# Patient Record
Sex: Female | Born: 1945 | Race: White | Hispanic: No | Marital: Married | Smoking: Former smoker
Health system: Southern US, Community
[De-identification: ages and names within clinical notes are randomized; demographics above are authoritative.]

## PROBLEM LIST (undated history)

## (undated) DIAGNOSIS — G43909 Migraine, unspecified, not intractable, without status migrainosus: Secondary | ICD-10-CM

## (undated) DIAGNOSIS — T4145XA Adverse effect of unspecified anesthetic, initial encounter: Secondary | ICD-10-CM

## (undated) DIAGNOSIS — M797 Fibromyalgia: Secondary | ICD-10-CM

## (undated) DIAGNOSIS — G4730 Sleep apnea, unspecified: Secondary | ICD-10-CM

## (undated) DIAGNOSIS — K219 Gastro-esophageal reflux disease without esophagitis: Secondary | ICD-10-CM

## (undated) DIAGNOSIS — K2270 Barrett's esophagus without dysplasia: Secondary | ICD-10-CM

## (undated) DIAGNOSIS — E119 Type 2 diabetes mellitus without complications: Secondary | ICD-10-CM

## (undated) DIAGNOSIS — M1990 Unspecified osteoarthritis, unspecified site: Secondary | ICD-10-CM

## (undated) DIAGNOSIS — R0600 Dyspnea, unspecified: Secondary | ICD-10-CM

## (undated) DIAGNOSIS — I10 Essential (primary) hypertension: Secondary | ICD-10-CM

## (undated) DIAGNOSIS — F410 Panic disorder [episodic paroxysmal anxiety] without agoraphobia: Secondary | ICD-10-CM

## (undated) DIAGNOSIS — J351 Hypertrophy of tonsils: Secondary | ICD-10-CM

## (undated) DIAGNOSIS — I499 Cardiac arrhythmia, unspecified: Secondary | ICD-10-CM

## (undated) DIAGNOSIS — Z9289 Personal history of other medical treatment: Secondary | ICD-10-CM

## (undated) DIAGNOSIS — J988 Other specified respiratory disorders: Secondary | ICD-10-CM

## (undated) DIAGNOSIS — K5792 Diverticulitis of intestine, part unspecified, without perforation or abscess without bleeding: Secondary | ICD-10-CM

## (undated) DIAGNOSIS — J45909 Unspecified asthma, uncomplicated: Secondary | ICD-10-CM

## (undated) DIAGNOSIS — J189 Pneumonia, unspecified organism: Secondary | ICD-10-CM

## (undated) DIAGNOSIS — N289 Disorder of kidney and ureter, unspecified: Secondary | ICD-10-CM

## (undated) DIAGNOSIS — T8859XA Other complications of anesthesia, initial encounter: Secondary | ICD-10-CM

## (undated) HISTORY — DX: Unspecified asthma, uncomplicated: J45.909

## (undated) HISTORY — DX: Gastro-esophageal reflux disease without esophagitis: K21.9

## (undated) HISTORY — PX: CHOLECYSTECTOMY: SHX55

## (undated) HISTORY — PX: RETINAL DETACHMENT SURGERY: SHX105

## (undated) HISTORY — PX: TOTAL HIP ARTHROPLASTY: SHX124

## (undated) HISTORY — PX: JOINT REPLACEMENT: SHX530

## (undated) HISTORY — DX: Diverticulitis of intestine, part unspecified, without perforation or abscess without bleeding: K57.92

## (undated) HISTORY — DX: Migraine, unspecified, not intractable, without status migrainosus: G43.909

## (undated) HISTORY — PX: TOTAL KNEE ARTHROPLASTY: SHX125

## (undated) HISTORY — DX: Essential (primary) hypertension: I10

## (undated) HISTORY — PX: REVISION TOTAL HIP ARTHROPLASTY: SHX766

## (undated) HISTORY — PX: OTHER SURGICAL HISTORY: SHX169

---
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
## 2002-05-09 HISTORY — PX: ROTATOR CUFF REPAIR: SHX139

## 2004-05-09 DIAGNOSIS — M797 Fibromyalgia: Secondary | ICD-10-CM

## 2004-05-09 HISTORY — DX: Fibromyalgia: M79.7

## 2008-01-16 ENCOUNTER — Encounter: Admission: RE | Admit: 2008-01-16 | Discharge: 2008-01-16 | Payer: Self-pay | Admitting: Family Medicine

## 2008-05-23 ENCOUNTER — Encounter: Admission: RE | Admit: 2008-05-23 | Discharge: 2008-05-23 | Payer: Self-pay | Admitting: Orthopedic Surgery

## 2008-06-30 ENCOUNTER — Inpatient Hospital Stay (HOSPITAL_COMMUNITY): Admission: RE | Admit: 2008-06-30 | Discharge: 2008-07-03 | Payer: Self-pay | Admitting: Orthopedic Surgery

## 2008-08-26 ENCOUNTER — Inpatient Hospital Stay (HOSPITAL_COMMUNITY): Admission: RE | Admit: 2008-08-26 | Discharge: 2008-08-29 | Payer: Self-pay | Admitting: Orthopedic Surgery

## 2008-09-06 ENCOUNTER — Observation Stay (HOSPITAL_COMMUNITY): Admission: EM | Admit: 2008-09-06 | Discharge: 2008-09-07 | Payer: Self-pay | Admitting: Emergency Medicine

## 2008-10-08 ENCOUNTER — Encounter: Admission: RE | Admit: 2008-10-08 | Discharge: 2009-01-06 | Payer: Self-pay | Admitting: Orthopedic Surgery

## 2008-12-16 ENCOUNTER — Ambulatory Visit (HOSPITAL_COMMUNITY): Admission: RE | Admit: 2008-12-16 | Discharge: 2008-12-16 | Payer: Self-pay | Admitting: Surgery

## 2009-02-20 ENCOUNTER — Encounter: Payer: Self-pay | Admitting: Cardiovascular Disease

## 2009-06-02 ENCOUNTER — Encounter: Payer: Self-pay | Admitting: Cardiology

## 2009-06-19 DIAGNOSIS — R7301 Impaired fasting glucose: Secondary | ICD-10-CM | POA: Insufficient documentation

## 2009-06-19 DIAGNOSIS — M069 Rheumatoid arthritis, unspecified: Secondary | ICD-10-CM | POA: Insufficient documentation

## 2009-06-19 DIAGNOSIS — G43909 Migraine, unspecified, not intractable, without status migrainosus: Secondary | ICD-10-CM | POA: Insufficient documentation

## 2009-06-19 DIAGNOSIS — E785 Hyperlipidemia, unspecified: Secondary | ICD-10-CM | POA: Insufficient documentation

## 2009-06-19 DIAGNOSIS — M797 Fibromyalgia: Secondary | ICD-10-CM | POA: Insufficient documentation

## 2009-06-19 DIAGNOSIS — F3289 Other specified depressive episodes: Secondary | ICD-10-CM | POA: Insufficient documentation

## 2009-06-19 DIAGNOSIS — M1990 Unspecified osteoarthritis, unspecified site: Secondary | ICD-10-CM | POA: Insufficient documentation

## 2009-06-19 DIAGNOSIS — E089 Diabetes mellitus due to underlying condition without complications: Secondary | ICD-10-CM | POA: Insufficient documentation

## 2009-06-19 DIAGNOSIS — I10 Essential (primary) hypertension: Secondary | ICD-10-CM | POA: Insufficient documentation

## 2009-06-19 DIAGNOSIS — R609 Edema, unspecified: Secondary | ICD-10-CM | POA: Insufficient documentation

## 2009-06-19 DIAGNOSIS — F329 Major depressive disorder, single episode, unspecified: Secondary | ICD-10-CM

## 2009-06-19 DIAGNOSIS — J45909 Unspecified asthma, uncomplicated: Secondary | ICD-10-CM | POA: Insufficient documentation

## 2009-06-19 DIAGNOSIS — R0609 Other forms of dyspnea: Secondary | ICD-10-CM | POA: Insufficient documentation

## 2009-06-19 DIAGNOSIS — D72829 Elevated white blood cell count, unspecified: Secondary | ICD-10-CM | POA: Insufficient documentation

## 2009-06-19 DIAGNOSIS — F411 Generalized anxiety disorder: Secondary | ICD-10-CM | POA: Insufficient documentation

## 2009-06-19 DIAGNOSIS — F410 Panic disorder [episodic paroxysmal anxiety] without agoraphobia: Secondary | ICD-10-CM | POA: Insufficient documentation

## 2009-06-19 DIAGNOSIS — R040 Epistaxis: Secondary | ICD-10-CM | POA: Insufficient documentation

## 2009-06-19 DIAGNOSIS — R0989 Other specified symptoms and signs involving the circulatory and respiratory systems: Secondary | ICD-10-CM

## 2009-06-23 ENCOUNTER — Ambulatory Visit: Payer: Self-pay | Admitting: Cardiovascular Disease

## 2009-06-23 DIAGNOSIS — R072 Precordial pain: Secondary | ICD-10-CM | POA: Insufficient documentation

## 2009-06-23 DIAGNOSIS — R0602 Shortness of breath: Secondary | ICD-10-CM | POA: Insufficient documentation

## 2009-07-14 ENCOUNTER — Telehealth (INDEPENDENT_AMBULATORY_CARE_PROVIDER_SITE_OTHER): Payer: Self-pay | Admitting: *Deleted

## 2009-07-15 ENCOUNTER — Ambulatory Visit: Payer: Self-pay | Admitting: Cardiology

## 2009-07-15 ENCOUNTER — Ambulatory Visit: Payer: Self-pay

## 2009-07-15 ENCOUNTER — Encounter: Payer: Self-pay | Admitting: Cardiovascular Disease

## 2009-07-15 ENCOUNTER — Ambulatory Visit (HOSPITAL_COMMUNITY): Admission: RE | Admit: 2009-07-15 | Discharge: 2009-07-15 | Payer: Self-pay | Admitting: Cardiovascular Disease

## 2009-07-15 ENCOUNTER — Ambulatory Visit: Payer: Self-pay | Admitting: Internal Medicine

## 2009-07-15 ENCOUNTER — Encounter (HOSPITAL_COMMUNITY): Admission: RE | Admit: 2009-07-15 | Discharge: 2009-09-08 | Payer: Self-pay | Admitting: Cardiovascular Disease

## 2009-07-20 ENCOUNTER — Encounter (HOSPITAL_COMMUNITY): Admission: RE | Admit: 2009-07-20 | Discharge: 2009-09-08 | Payer: Self-pay | Admitting: Cardiovascular Disease

## 2009-07-20 ENCOUNTER — Ambulatory Visit: Payer: Self-pay | Admitting: Cardiovascular Disease

## 2009-07-20 ENCOUNTER — Ambulatory Visit: Payer: Self-pay

## 2009-07-20 ENCOUNTER — Encounter: Payer: Self-pay | Admitting: Cardiology

## 2009-09-10 ENCOUNTER — Encounter: Admission: RE | Admit: 2009-09-10 | Discharge: 2009-09-10 | Payer: Self-pay | Admitting: Family Medicine

## 2010-05-20 ENCOUNTER — Encounter: Payer: Self-pay | Admitting: Cardiology

## 2010-05-20 ENCOUNTER — Encounter: Payer: Self-pay | Admitting: Cardiovascular Disease

## 2010-05-20 ENCOUNTER — Inpatient Hospital Stay (HOSPITAL_COMMUNITY)
Admission: EM | Admit: 2010-05-20 | Discharge: 2010-05-22 | Payer: Self-pay | Source: Home / Self Care | Attending: Cardiology | Admitting: Cardiology

## 2010-05-24 LAB — HEPARIN LEVEL (UNFRACTIONATED)
Heparin Unfractionated: <0.1 IU/mL — ABNORMAL LOW (ref 0.30–0.70)
Heparin Unfractionated: <0.1 IU/mL — ABNORMAL LOW (ref 0.30–0.70)

## 2010-05-24 LAB — CBC
HCT: 35.6 % — ABNORMAL LOW (ref 36.0–46.0)
HCT: 34.2 % — ABNORMAL LOW (ref 36.0–46.0)
HCT: 37.5 % (ref 36.0–46.0)
Hemoglobin: 11.1 g/dL — ABNORMAL LOW (ref 12.0–15.0)
Hemoglobin: 10.6 g/dL — ABNORMAL LOW (ref 12.0–15.0)
Hemoglobin: 11.6 g/dL — ABNORMAL LOW (ref 12.0–15.0)
MCH: 20.6 pg — ABNORMAL LOW (ref 26.0–34.0)
MCH: 20.5 pg — ABNORMAL LOW (ref 26.0–34.0)
MCH: 20.4 pg — ABNORMAL LOW (ref 26.0–34.0)
MCHC: 31.2 g/dL (ref 30.0–36.0)
MCHC: 31 g/dL (ref 30.0–36.0)
MCHC: 30.9 g/dL (ref 30.0–36.0)
MCV: 66.2 fL — ABNORMAL LOW (ref 78.0–100.0)
MCV: 66.3 fL — ABNORMAL LOW (ref 78.0–100.0)
MCV: 66 fL — ABNORMAL LOW (ref 78.0–100.0)
Platelets: 189 10*3/uL (ref 150–400)
Platelets: 183 10*3/uL (ref 150–400)
Platelets: 225 10*3/uL (ref 150–400)
RBC: 5.38 MIL/uL — ABNORMAL HIGH (ref 3.87–5.11)
RBC: 5.16 MIL/uL — ABNORMAL HIGH (ref 3.87–5.11)
RBC: 5.68 MIL/uL — ABNORMAL HIGH (ref 3.87–5.11)
RDW: 15.9 % — ABNORMAL HIGH (ref 11.5–15.5)
RDW: 15.9 % — ABNORMAL HIGH (ref 11.5–15.5)
RDW: 16.2 % — ABNORMAL HIGH (ref 11.5–15.5)
WBC: 6.1 10*3/uL (ref 4.0–10.5)
WBC: 7.5 10*3/uL (ref 4.0–10.5)
WBC: 7.6 10*3/uL (ref 4.0–10.5)

## 2010-05-24 LAB — BASIC METABOLIC PANEL
BUN: 7 mg/dL (ref 6–23)
BUN: 5 mg/dL — ABNORMAL LOW (ref 6–23)
CO2: 26 mEq/L (ref 19–32)
CO2: 29 mEq/L (ref 19–32)
Calcium: 8.9 mg/dL (ref 8.4–10.5)
Calcium: 8.8 mg/dL (ref 8.4–10.5)
Chloride: 102 mEq/L (ref 96–112)
Chloride: 104 mEq/L (ref 96–112)
Creatinine, Ser: 0.52 mg/dL (ref 0.4–1.2)
Creatinine, Ser: 0.64 mg/dL (ref 0.4–1.2)
GFR calc Af Amer: >60 mL/min (ref >60)
GFR calc Af Amer: >60 mL/min (ref >60)
GFR calc non Af Amer: >60 mL/min (ref >60)
GFR calc non Af Amer: >60 mL/min (ref >60)
Glucose, Bld: 192 mg/dL — ABNORMAL HIGH (ref 70–99)
Glucose, Bld: 198 mg/dL — ABNORMAL HIGH (ref 70–99)
Potassium: 3.4 mEq/L — ABNORMAL LOW (ref 3.5–5.1)
Potassium: 3.8 mEq/L (ref 3.5–5.1)
Sodium: 137 mEq/L (ref 135–145)
Sodium: 139 mEq/L (ref 135–145)

## 2010-05-24 LAB — CARDIAC PANEL(CRET KIN+CKTOT+MB+TROPI)
CK, MB: 1.7 ng/mL (ref 0.3–4.0)
Relative Index: INVALID (ref 0.0–2.5)
Total CK: 80 U/L (ref 7–177)
Troponin I: 0.01 ng/mL (ref 0.00–0.06)

## 2010-05-24 LAB — GLUCOSE, CAPILLARY
Glucose-Capillary: 188 mg/dL — ABNORMAL HIGH (ref 70–99)
Glucose-Capillary: 174 mg/dL — ABNORMAL HIGH (ref 70–99)
Glucose-Capillary: 222 mg/dL — ABNORMAL HIGH (ref 70–99)
Glucose-Capillary: 147 mg/dL — ABNORMAL HIGH (ref 70–99)
Glucose-Capillary: 170 mg/dL — ABNORMAL HIGH (ref 70–99)
Glucose-Capillary: 153 mg/dL — ABNORMAL HIGH (ref 70–99)

## 2010-05-24 LAB — POCT CARDIAC MARKERS
CKMB, poc: 1.5 ng/mL (ref 1.0–8.0)
CKMB, poc: <1 ng/mL — ABNORMAL LOW (ref 1.0–8.0)
Myoglobin, poc: 54 ng/mL (ref 12–200)
Myoglobin, poc: 53.2 ng/mL (ref 12–200)
Troponin i, poc: 0.1 ng/mL — ABNORMAL HIGH (ref 0.00–0.09)
Troponin i, poc: <0.05 ng/mL (ref 0.00–0.09)

## 2010-05-24 LAB — CK TOTAL AND CKMB (NOT AT ARMC)
CK, MB: 1.4 ng/mL (ref 0.3–4.0)
Relative Index: INVALID (ref 0.0–2.5)
Total CK: 55 U/L (ref 7–177)

## 2010-05-24 LAB — TROPONIN I: Troponin I: 0.03 ng/mL (ref 0.00–0.06)

## 2010-05-24 LAB — PROTIME-INR
INR: 1.03 (ref 0.00–1.49)
Prothrombin Time: 13.7 seconds (ref 11.6–15.2)

## 2010-05-24 LAB — MRSA PCR SCREENING: MRSA by PCR: NEGATIVE

## 2010-05-24 LAB — LIPID PANEL
Cholesterol: 198 mg/dL (ref 0–200)
HDL: 35 mg/dL — ABNORMAL LOW (ref >39)
LDL Cholesterol: UNDETERMINED mg/dL (ref 0–99)
Total CHOL/HDL Ratio: 5.7 RATIO
Triglycerides: 767 mg/dL — ABNORMAL HIGH (ref <150)
VLDL: UNDETERMINED mg/dL (ref 0–40)

## 2010-05-24 LAB — D-DIMER, QUANTITATIVE: D-Dimer, Quant: 0.45 ug/mL-FEU (ref 0.00–0.48)

## 2010-05-31 NOTE — Discharge Summary (Addendum)
  NAME:  Anne Bryant, CLINKSCALES         ACCOUNT NO.:  [id number]  MEDICAL RECORD NO.:  [id number]          PATIENT TYPE:  INP  LOCATION:  6523                         FACILITY:  [facility]  PHYSICIAN:  Colleen Can. Deborah Chalk, M.D.DATE OF BIRTH:  Feb 01, 1946  DATE OF ADMISSION:  05/20/2010 DATE OF DISCHARGE:  05/22/2010                              DISCHARGE SUMMARY   Please note the patient stated she felt somewhat dizzy today and did not feel comfortable going home.  Her dizziness has been ongoing for the past several weeks and she has been evaluated as an outpatient for this by her primary care doctor.  She had a negative CT scan per the patient from her primary care provider and has pending followup when she is scheduled her appointment with [facility].  Telemetry was reviewed and she had no documented arrhythmia.  Echocardiogram showed normal EF with grade 1 diastolic dysfunction.  Dr. Deborah Chalk has seen and examined her today and feels she is stable for discharge.     Dayna Dunn, P.A.C.   ______________________________ Colleen Can. Deborah Chalk, M.D.    DD/MEDQ  D:  05/22/2010  T:  05/22/2010  Job:  [id number]  cc:   Verne Carrow, MD Dr. Divernon Sink  Electronically Signed by Ronie Spies  on 05/31/2010 10:32:08 AM Electronically Signed by Roger Shelter M.D. on 06/03/2010 03:34:54 PM

## 2010-05-31 NOTE — Discharge Summary (Addendum)
  NAME:  Anne Bryant, Anne Bryant         ACCOUNT NO.:  [id number]  MEDICAL RECORD NO.:  [id number]          PATIENT TYPE:  INP  LOCATION:  6523                         FACILITY:  [facility]  PHYSICIAN:  Colleen Can. Deborah Chalk, M.D.DATE OF BIRTH:  [birth year]  DATE OF ADMISSION:  05/20/2010 DATE OF DISCHARGE:  05/22/2010                              DISCHARGE SUMMARY   ADDENDUM  Addendum to the previously dictated discharge summary.  Please note upon discharge, the patient noted two errors on her medication reconciliation.  She is taking Wellbutrin 300 mg daily as well as no longer taking Imitrex.  Her med rec has been updated to coincide with these changes.     Dayna Dunn, P.A.C.   ______________________________ Colleen Can. Deborah Chalk, M.D.    DD/MEDQ  D:  05/22/2010  T:  05/22/2010  Job:  [id number]  Electronically Signed by Ronie Spies  on 05/31/2010 10:32:14 AM Electronically Signed by Roger Shelter M.D. on 06/03/2010 03:34:57 PM

## 2010-06-06 NOTE — H&P (Addendum)
NAME:  Anne Bryant, Anne Bryant NO.:  [id number]  MEDICAL RECORD NO.:  [id number]          PATIENT TYPE:  INP  LOCATION:  2925                         FACILITY:  [facility]  PHYSICIAN:  Madolyn Frieze. Jens Som, MD, FACCDATE OF BIRTH:  03/18/1946  DATE OF ADMISSION:  05/20/2010 DATE OF DISCHARGE:                             HISTORY & PHYSICAL   PRIMARY CARDIOLOGIST:  Verne Carrow, MD  PRIMARY CARE PROVIDER:  Dalbert Mayotte, MD  CHIEF COMPLAINT:  Chest tightness, nausea, and shortness of breath.  PATIENT PROFILE:  This is a 66 year old female without known coronary artery disease, but a history of hypertension, hyperlipidemia, irritable bowel syndrome, and fibromyalgia who presents here with complaints of chest pain of unclear etiology.  PAST MEDICAL HISTORY: 1. Hyperlipidemia. 2. Hypertension. 3. Fibromyalgia. 4. Depression. 5. Anxiety disorder. 6. Rheumatoid arthritis. 7. Migraine headaches. 8. Asthma. 9. Diabetes mellitus. 10.Obstructive sleep apnea, nightly CPAP use. 11.Restless legs syndrome. 12.Iron deficiency anemia. 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 14.Irritable bowel syndrome. 15.Status post left knee replacement in 2007. 16.Status post left hip replacement in 2010. 17.Umbilical hernia repair in 1989. 18.Status post cholecystectomy in 1989. 19.Status post left shoulder surgery.  HISTORY OF PRESENT ILLNESS:  This is a 65 year old female without known coronary artery disease, but the above-stated problem list who states that for the last 3 days she has had a tightness in the left side of her chest.  This has been continuous.  It has been associated with nausea, shortness of breath.  She denies any diaphoresis or vomiting.  The pain does increase with activity, lying flat, and with inspiration.  The patient also complains of dyspnea on exertion, but no orthopnea, PND, or syncope.  The patient also describes some left-sided facial numbness and tingling that also is  down her left arm.  This episode occurred on May 18, 2010, resolved on its own.  She has had no further episodes of this feeling.  The patient states she has had chest pain previously, but it was different than the pain that she presents with.  She did complete a Myoview in 2011 that showed no ischemia and ejection fraction 75%.  The patient presented to [facility] for a follow up with elevated blood pressure on day of admission and I spoke to her provider about her symptoms that she has been having.  She is also complaining of increased fatigue.  An EKG was obtained that did show new T-wave inversions, no change from her previous tracing 1 year ago.  The patient was sent to [facility] via ambulance.  She was placed on IV heparin, nitroglycerin, loaded with Plavix, and given metoprolol as well as aspirin.  Her pain has eased slightly, but she is still with chest discomfort.  Her initial point-of-care markers showed an elevated troponin of 0.10.  Cardiology has been asked to admit the patient for further evaluation.  SOCIAL HISTORY:  The patient lives in [place] with her husband. She has a son.  She is on disability.  She denies any tobacco abuse. She has an occasional alcoholic beverage.  She denies any illicit drug use.  FAMILY HISTORY:  Pertinent for early coronary artery disease on her father's side.  Her father had his first myocardial infarction at the age of 52 and passed away from myocardial infarction at the age of 30. Her mother has no known early coronary artery disease, did have breast cancer as well as late onset myocardial infarction.  ALLERGIES/INTOLERANCE: 1. LYRICA causing dizziness. 2. ZITHROMAX causing a funny feeling.  HOME MEDICATIONS: 1. Prednisone 5 mg 1-1/2 tablet daily. 2. Mirapex 0.5 mg 1 tablet nightly. 3. Singulair 10 mg 1 tablet daily. 4. Phenergan 25 mg 1 tablet every 6 hours as needed for nausea and     vomiting. 5. Wellbutrin 300  mg 1 tablet daily. 6. Cymbalta 120 mg delayed release 1 capsule daily. 7. Xanax 1 mg 1 tablet 3 times a day as needed for anxiety. 8. Abilify 2 mg 1 tablet daily. 9. Imitrex 25 mg 2 tablets at onset of migraine every 2 hours as     needed. 10.Dicyclomine hydrochloride 10 mg 1-2 capsules 4 times a day. 11.Neurontin 300 mg 1 capsule daily. 12.Albuterol inhaler 2 puffs 4 times a day. 13.QVAR 40 mcg 2 puffs 2 times a day. 14.Advair 250/50 one puff 2 times a day. 15.Ramipril 10 mg 1 capsule daily.  REVIEW OF SYSTEMS:  All pertinent positives and negatives as stated in HPI.  All other systems have been reviewed and are negative.  PHYSICAL EXAMINATION:  VITAL SIGNS:  Temperature 98.1, pulse 92, respirations 20, blood pressure 120/75, O2 saturations 95% on room air. GENERAL:  This is an obese white female.  She is in no acute distress. HEENT:  Normal. NECK:  Supple without bruit or JVD. HEART:  Regular rate and rhythm with S1 and S2.  No murmur, rub, or gallop noted.  PMI is normal.  Pulses are 2+ and equal bilaterally. LUNGS:  Clear to auscultation bilaterally without wheezes, rales, or rhonchi. ABDOMEN:  Soft, nontender, positive bowel sounds x4. EXTREMITIES:  No clubbing, cyanosis, or edema. MUSCULOSKELETAL:  No joint deformities or effusions. NEURO:  Alert and oriented x3, cranial nerves II through XII grossly intact.  Chest x-ray showing no acute cardiopulmonary disease.  EKG showing normal sinus rhythm at a rate 89 beats per minute.  There are anterolateral T-wave inversions that appear changed from previous tracing in 2011.  Axis is normal.  Interval are normal.  LABORATORY DATA:  WBC of 6.1, hemoglobin 11.1, hematocrit 35.6, platelet 189.  Sodium 137, potassium 3.4, chloride 102, bicarb 26, BUN 7, creatinine 0.52, troponin 0.10, myoglobin and CK-MB within normal limits x1, MCV 66.2, MCH 20.6.  ASSESSMENT AND PLAN:  This is a 65 year old female with history of diabetes  mellitus, hypertension, hypercholesterolemia, obstructive sleep apnea, fibromyalgia, irritable bowel syndrome who presents with chest pain of unclear etiology.  EKG is with new anterolateral T-wave inversions.  Point-of-care markers are mildly elevated.  Her EKG and troponins are concerning, but her pain is atypical, has been continuous over the last 3 days and worsens with inspiration in the supine position.  We will plan to admit the patient and cycle cardiac enzymes to rule out for myocardial infarction.  The patient will most likely need cardiac catheterization, these orders have been placed on the chart.  We will check a 2D echocardiogram in the emergency department,  check for any wall motion abnormalities as well as proximal aortic root  even though dissection is doubtful.  We will check a D-dimer and rule out  for pulmonary embolism. The patient will be continued on nitroglycerin drip,  heparin, aspirin, and a beta-blocker.  A fasting lipid  will be checked  in the morning.  We will also follow daily CBCs and BMET given the  patient's microcytic anemia but this appears to be chronic.  Further  treatment will be dependent upon the above results.     [name], PA-C   ______________________________ Madolyn Frieze. Jens Som, MD, [facility]    NB/MEDQ  D:  05/20/2010  T:  05/21/2010  Job:  [id number]  cc:   Verne Carrow, MD Dalbert Mayotte, M.D.  Electronically Signed by Alen Blew P.A. on 05/24/2010 06:46:38 AM Electronically Signed by Olga Millers MD FACC on 06/06/2010 09:02:32 PM

## 2010-06-08 NOTE — Assessment & Plan Note (Signed)
Summary: Cardiology Nuclear Study  Nuclear Med Background Indications for Stress Test: Evaluation for Ischemia, Surgical Clearance  Indications Comments: Pending (R) TKR by Dr. Durene Romans  History: Asthma, Myocardial Perfusion Study  History Comments:  ~10 yrs ago MPS:"unconclusive" per patient, recommended cath, patient refused.  Symptoms: Chest Pain, Chest Tightness, Chest Tightness with Exertion, Diaphoresis, Dizziness, DOE, Fatigue, Nausea, Rapid HR  Symptoms Comments: Last episode of CP:now, 4/10.   Nuclear Pre-Procedure Cardiac Risk Factors: Family History - CAD, Hypertension, Lipids, NIDDM, Obesity Caffeine/Decaff Intake: None NPO After: 7:00 PM Lungs: Clear.  O2 Sat 98% on RA.  Albuterol inhaler used prior to lexiscan. IV 0.9% NS with Angio Cath: 22g     IV Site: (L) AC IV Started by: Irean Hong RN Chest Size (in) 42     Cup Size B     Height (in): 62 Weight (lb): 181 BMI: 33.22  Nuclear Med Study 1 or 2 day study:  2 day     Stress Test Type:  Eugenie Birks Reading MD:  Willa Rough, MD     Referring MD:  Verne Carrow, MD Resting Radionuclide:  Technetium 61m Tetrofosmin     Resting Radionuclide Dose:  33.0 mCi  Stress Radionuclide:  Technetium 24m Tetrofosmin     Stress Radionuclide Dose:  33.0 mCi   Stress Protocol   Lexiscan: 0.4 mg   Stress Test Technologist:  Rea College CMA-N     Nuclear Technologist:  Burna Mortimer Deal RT-N  Rest Procedure  Myocardial perfusion imaging was performed at rest 45 minutes following the intravenous administration of Myoview Technetium 61m Tetrofosmin.  Stress Procedure  The patient received IV Lexiscan 0.4 mg over 15-seconds.  Myoview injected at 30-seconds.  There were no significant changes with lexiscan.  She did c/o chest pressure with lexiscan.  Quantitative spect images were obtained after a 45 minute delay.  QPS Raw Data Images:  Patient motion noted; appropriate software correction applied. Stress Images:  There is  normal uptake in all areas. Rest Images:  Normal homogeneous uptake in all areas of the myocardium. Subtraction (SDS):  No evidence of ischemia. Transient Ischemic Dilatation:  .84  (Normal <1.22)  Lung/Heart Ratio:  .28  (Normal <0.45)  Quantitative Gated Spect Images QGS EDV:  67 ml QGS ESV:  17 ml QGS EF:  74 % QGS cine images:  Normal motion   Findings Normal nuclear study      Overall Impression  Exercise Capacity: Lexiscan BP Response: Normal blood pressure response. Clinical Symptoms: chest pressure ECG Impression: No significant ST segment change suggestive of ischemia. Overall Impression: Normal stress nuclear study.  Appended Document: Cardiology Nuclear Study Reviewed with pt at visit. cdm

## 2010-06-08 NOTE — Progress Notes (Signed)
Summary: Nuclear Pre-Procedure  Phone Note Outgoing Call   Call placed by: Burna Mortimer Deal Call placed to: Patient Action Taken: Phone Call Completed Summary of Call: Reviewed information on Myoview Information Sheet (see scanned document for further details).  Spoke with Patient.    Nuclear Med Background Indications for Stress Test: Evaluation for Ischemia, Surgical Clearance  Indications Comments: Pending (R) Knee Replacement  History: Asthma   Symptoms: Chest Pain, Chest Tightness, Chest Tightness with Exertion, Dizziness, DOE, Fatigue, SOB    Nuclear Pre-Procedure Cardiac Risk Factors: Family History - CAD, Hypertension, Lipids, Obesity Height (in): 62

## 2010-06-08 NOTE — Assessment & Plan Note (Signed)
Summary: 3 WK/F/U [facility]   Visit Type:  Follow-up Primary Provider:  Jill Side Snider,MD  CC:  SOB.  History of Present Illness: 65 yo WF with multiple medical problems including borderline DM, HTN, hyperlipidemia, obesity, sedentary lifestyle here today for cardiac follow up. I saw her as a new patient several weeks ago for cardiac assessment prior to planned right knee replacement. She told  me that she was recently diagnosed with a murmur. She describes substernal chest tightness that occurs with rest and with exertion. She has been dyspneic and cannot perform simple household tasks secondary to the dyspnea. She has occasional dizziness but denies syncope or palpitations, lower extremity edema. She recently had a sleep study and was found to have apneic periods with desaturation. She has been wearing CPAP for the past few months. Overall, her energy level has been poor. She describes constant malaise and fatigue. She has had daily lower extremity edema over the last few months, resolves at night, bilateral. No pain in legs or unilateral lower extremity edema.   Stress testing and echo was ordered. (outlined below). She tells me today that she continues to have daily fatigue, occasional chest pressure and dyspnea.   Current Medications (verified): 1)  Norvasc 5 Mg Tabs (Amlodipine Besylate) .Marland Kitchen.. 1 Tab Once Daily 2)  Ramipril 10 Mg Caps (Ramipril) .Marland Kitchen.. 1 Cap Once Daily 3)  Cymbalta 60 Mg Cpep (Duloxetine Hcl) .... 2 Cap Once Daily 4)  Xanax 1 Mg Tabs (Alprazolam) .Marland Kitchen.. 1 Tab Three Times A Day 5)  Wellbutrin Xl 300 Mg Xr24h-Tab (Bupropion Hcl) .Marland Kitchen.. 1 Tab Once Daily 6)  Vicodin 5-500 Mg Tabs (Hydrocodone-Acetaminophen) .... As Needed 7)  Promethazine Hcl 25 Mg Tabs (Promethazine Hcl) .... As Needed 8)  Vitamin D (Ergocalciferol) 50000 Unit Caps (Ergocalciferol) .Marland Kitchen.. 1 Cap Twice Weekly 9)  Nasonex 50 Mcg/act Susp (Mometasone Furoate) .... Use As Directed 10)  Bumetanide 1 Mg Tabs  (Bumetanide) .Marland Kitchen.. 1 Tab Once Daily 11)  Neurontin 300 Mg Caps (Gabapentin) .Marland Kitchen.. 1 Tab Once Daily 12)  Singulair 10 Mg Tabs (Montelukast Sodium) .Marland Kitchen.. 1 Tab At Bedtime 13)  Prednisone 5 Mg Tabs (Prednisone) .Marland Kitchen.. 1 1/2 Tab Once Daily 14)  Omeprazole 20 Mg Tbec (Omeprazole) .Marland Kitchen.. 1 Tab Two Times A Day 15)  Cpap Machine .... Use At Bedtime 16)  Allergy Relief 4 Mg Tabs (Chlorpheniramine Maleate) .... As Needed 17)  Albuterol Sulfate (2.5 Mg/60ml) 0.083% Nebu (Albuterol Sulfate) .... As Needed 18)  Macrobid 100 Mg Caps (Nitrofurantoin Monohyd Macro) .Marland Kitchen.. 1 Cap Once Daily As Needed 19)  Advair Diskus 250-50 Mcg/dose Aepb (Fluticasone-Salmeterol) .Marland Kitchen.. 1 Puff Two Times A Day 20)  Mirapex 0.25 Mg Tabs (Pramipexole Dihydrochloride) .Marland Kitchen.. 1 Tab At Bedtime 21)  Excedrin Migraine 250-250-65 Mg Tabs (Aspirin-Acetaminophen-Caffeine) .... As Needed 22)  Tylenol 325 Mg Tabs (Acetaminophen) .... As Needed  Allergies: 1)  ! Zoloft 2)  ! * Lyrica 3)  ! * Lexapro 4)  ! Zithromax  Past History:  Past Medical History: Reviewed history from 06/23/2009 and no changes required. HYPERTENSION (ICD-401.9) HYPERLIPIDEMIA (ICD-272.4) LEG EDEMA (ICD-782.3) LEUKOCYTOSIS (ICD-288.60) DM (ICD-250.00) IMPAIRED FASTING GLUCOSE (ICD-790.21) EPISTAXIS (ICD-784.7) MIGRAINE HEADACHE (ICD-346.90) ANXIETY DISORDER (ICD-300.00) DEPRESSION (ICD-311) FIBROMYALGIA (ICD-729.1) PANIC ATTACK (ICD-300.01) ASTHMA (ICD-493.90) RHEUMATOID ARTHRITIS (ICD-714.0) OSTEOARTHRITIS (ICD-715.90) Recurrent UTI GERD  Social History: Reviewed history from 06/23/2009 and no changes required. Disabled  Married , 2 children Tobacco Use - No.  Alcohol Use - no Regular Exercise - no Drug Use - no  Review of Systems  The patient complains of fatigue, malaise, chest pain, shortness of breath, heartburn, muscle weakness, leg swelling, and anxiety.  The patient denies fever, weight gain/loss, vision loss, decreased hearing, hoarseness,  palpitations, prolonged cough, wheezing, sleep apnea, coughing up blood, abdominal pain, blood in stool, nausea, vomiting, diarrhea, incontinence, blood in urine, joint pain, rash, skin lesions, headache, fainting, dizziness, depression, enlarged lymph nodes, easy bruising or bleeding, and environmental allergies.    Vital Signs:  Patient profile:   64 year old female Height:      62 inches Weight:      181 pounds BMI:     33.22 Pulse rate:   98 / minute Pulse rhythm:   regular BP sitting:   114 / 70  (left arm) Cuff size:   large  Vitals Entered By: Danielle Rankin, CMA (July 20, 2009 2:07 PM)  Physical Exam  General:  General: Well developed, well nourished, NAD HEENT: OP clear, mucus membranes moist Psychiatric: Mood and affect normal Neck: No JVD, no carotid bruits, no thyromegaly, no lymphadenopathy. Lungs:Clear bilaterally, no wheezes, rhonci, crackles CV: RRR no murmurs, gallops rubs Abdomen: soft, NT, ND, BS present Extremities: No edema, pulses 2+.    Echocardiogram  Procedure date:  07/15/2009  Findings:      - Left ventricle: The cavity size was normal. There was mild       concentric hypertrophy. The estimated ejection fraction was 60%.       Wall motion was normal; there were no regional wall motion       abnormalities. Features are consistent with a pseudonormal left       ventricular filling pattern, with concomitant abnormal relaxation       and increased filling pressure (grade 2 diastolic dysfunction).     - Left atrium: The atrium was mildly dilated.     - Atrial septum: There was increased thickness of the septum,       consistent with lipomatous hypertrophy.  Nuclear Study  Procedure date:  07/20/2009  Findings:      No ischemia. EF 74%.   Impression & Recommendations:  Problem # 1:  CHEST PAIN-PRECORDIAL (ICD-786.51) No evidence of ischemia on stress test. Normal LVEF. She does have diastolic dysfunction with concentric left ventricular  hypertrophy, most likely from her longstanding HTN. No further ischemic workup prior to planned surgical procedures. Will repeat echo one year.   Her updated medication list for this problem includes:    Norvasc 5 Mg Tabs (Amlodipine besylate) .Marland Kitchen... 1 tab once daily    Ramipril 10 Mg Caps (Ramipril) .Marland Kitchen... 1 cap once daily  Problem # 2:  HYPERTENSION (ICD-401.9) Well controlled today on current therapy.  No changes.   Her updated medication list for this problem includes:    Norvasc 5 Mg Tabs (Amlodipine besylate) .Marland Kitchen... 1 tab once daily    Ramipril 10 Mg Caps (Ramipril) .Marland Kitchen... 1 cap once daily    Bumetanide 1 Mg Tabs (Bumetanide) .Marland Kitchen... 1 tab once daily  Patient Instructions: 1)  Your physician recommends that you schedule a follow-up appointment in: 12 months 2)  Your physician has requested that you have an echocardiogram.  Echocardiography is a painless test that uses sound waves to create images of your heart. It provides your doctor with information about the size and shape of your heart and how well your heart's chambers and valves are working.  This procedure takes approximately one hour. There are no restrictions for this procedure. To be done in 12 months

## 2010-06-08 NOTE — Consult Note (Signed)
Summary: [facility]  [facility]   Imported By: Marylou Mccoy 08/21/2009 14:46:03  _____________________________________________________________________  External Attachment:    Type:   Image     Comment:   External Document

## 2010-06-08 NOTE — Assessment & Plan Note (Signed)
Summary: np6/surgical clearance/shoulder surgery   Visit Type:  Initial Consult Primary Provider:  [facility]  CC:  Preoperative risk assessment. Pt complains of chest pains and SOB.  History of Present Illness: 65 yo WF with multiple medical problems including borderline DM, HTN, hyperlipidemia, obesity, sedentary lifestyle referred today for cardiac assessment prior to planned right knee replacement. She tells me that she has recently been diagnosed with a murmur. She describes substernal chest tightness that occurs with rest and with exertion. She has been dyspneic and cannot perform simple household tasks secondary to the dyspnea. She has occasional dizziness but denies syncope or palpitations, lower extremity edema. She recently had a sleep study and was found to have apneic periods with desaturation. She has been wearing CPAP for the past few months. Overall, her energy level has been poor. She describes constant malaise and fatigue. She has had daily lower extremity edema over the last few months, resolves at night, bilateral. No pain in legs or unilateral lower extremity edema.   Current Medications (verified): 1)  Norvasc 5 Mg Tabs (Amlodipine Besylate) .Marland Kitchen.. 1 Tab Once Daily 2)  Ramipril 10 Mg Caps (Ramipril) .Marland Kitchen.. 1 Cap Once Daily 3)  Tricor 145 Mg Tabs (Fenofibrate) .Marland Kitchen.. 1 Tab Every Other Day 4)  Cymbalta 60 Mg Cpep (Duloxetine Hcl) .... 2 Cap Once Daily 5)  Xanax 1 Mg Tabs (Alprazolam) .Marland Kitchen.. 1 Tab Three Times A Day 6)  Wellbutrin Xl 150 Mg Xr24h-Tab (Bupropion Hcl) .Marland Kitchen.. 1 Tab Once Daily 7)  Vicodin 5-500 Mg Tabs (Hydrocodone-Acetaminophen) .... As Needed 8)  Promethazine Hcl 25 Mg Tabs (Promethazine Hcl) .... As Needed 9)  Vitamin D (Ergocalciferol) 50000 Unit Caps (Ergocalciferol) .Marland Kitchen.. 1 Cap Twice Weekly 10)  Nasonex 50 Mcg/act Susp (Mometasone Furoate) .... Use As Directed 11)  Furosemide 20 Mg Tabs (Furosemide) .Marland Kitchen.. 1 Tab Once Daily As Needed 12)  Neurontin 300 Mg Caps  (Gabapentin) .Marland Kitchen.. 1 Tab Once Daily 13)  Singulair 10 Mg Tabs (Montelukast Sodium) .Marland Kitchen.. 1 Tab At Bedtime 14)  Prednisone 5 Mg Tabs (Prednisone) .Marland Kitchen.. 1 1/2 Tab Once Daily 15)  Omeprazole 20 Mg Tbec (Omeprazole) .Marland Kitchen.. 1 Tab Two Times A Day 16)  Cpap Machine .... Use At Bedtime 17)  Macrobid 100 Mg Caps (Nitrofurantoin Monohyd Macro) .Marland Kitchen.. 1 Tab By Mouth Once Daily 18)  Allergy Relief 4 Mg Tabs (Chlorpheniramine Maleate) .... As Needed 19)  Albuterol Sulfate (2.5 Mg/21ml) 0.083% Nebu (Albuterol Sulfate) .... As Needed  Allergies: 1)  ! Zoloft 2)  ! * Lyrica 3)  ! * Lexapro 4)  ! Zithromax  Past History:  Past Medical History: HYPERTENSION (ICD-401.9) HYPERLIPIDEMIA (ICD-272.4) LEG EDEMA (ICD-782.3) LEUKOCYTOSIS (ICD-288.60) DM (ICD-250.00) IMPAIRED FASTING GLUCOSE (ICD-790.21) EPISTAXIS (ICD-784.7) MIGRAINE HEADACHE (ICD-346.90) ANXIETY DISORDER (ICD-300.00) DEPRESSION (ICD-311) FIBROMYALGIA (ICD-729.1) PANIC ATTACK (ICD-300.01) ASTHMA (ICD-493.90) RHEUMATOID ARTHRITIS (ICD-714.0) OSTEOARTHRITIS (ICD-715.90) Recurrent UTI GERD  Past Surgical History: Left hip replacement 2010 with repair left femur after fall and fracture 2010 Left knee replacement 2007 Rotator cuff surgery Cholecystectomy Umbilical hernia repair  Family History: Father: deceased age 24. First MI at age 28. Mother: deceased age 68 breast cancer, bladder cancer, cirrhosis (? NASH) Uncle died MI at age 87 Grandmother died MI at age 78  Social History: Disabled  Married , 2 children Tobacco Use - No.  Alcohol Use - no Regular Exercise - no Drug Use - no  Review of Systems       The patient complains of fatigue, malaise, chest pain, shortness of breath, sleep apnea,  muscle weakness, joint pain, leg swelling, dizziness, and anxiety.  The patient denies fever, weight gain/loss, vision loss, decreased hearing, hoarseness, palpitations, prolonged cough, wheezing, coughing up blood, abdominal pain, blood in  stool, nausea, vomiting, diarrhea, heartburn, incontinence, blood in urine, rash, skin lesions, headache, fainting, enlarged lymph nodes, easy bruising or bleeding, and environmental allergies.    Vital Signs:  Patient profile:   65 year old female Height:      62 inches Weight:      180 pounds BMI:     33.04 Pulse rate:   101 / minute Resp:     14 per minute BP sitting:   92 / 63  (left arm)  Vitals Entered By: Kem Parkinson (June 23, 2009 1:56 PM)  Physical Exam  General:  General: Well developed, well nourished, NAD. Pt mildly diaphoretic.  HEENT: OP clear, mucus membranes moist SKIN: warm, moist. Neuro: No focal deficits Musculoskeletal: Muscle strength 5/5 all ext Psychiatric: Mood and affect normal Neck: No JVD, no carotid bruits, no thyromegaly, no lymphadenopathy. Lungs:Clear bilaterally, no wheezes, rhonci, crackles CV: RRR no murmurs, gallops rubs Abdomen: soft, NT, ND, BS present Extremities: No edema, pulses 2+.    EKG  Procedure date:  06/23/2009  Findings:      NSR rate 85 bpm. QTC 471 msec.  Impression & Recommendations:  Problem # 1:  CHEST PAIN-PRECORDIAL (ICD-786.51) Risk factors for coronary artery disease include HTN, hyperlipidemia, borderline DM, obesity, sedentary lifestyle and a strong family history of CAD. Her chest pain has mostly atypical features but given her risk factors for CAD, will proceed with Lexiscan stress Myoview. She cannot exercise on the treadmill secondary  to her joint issues.  Her updated medication list for this problem includes:    Norvasc 5 Mg Tabs (Amlodipine besylate) .Marland Kitchen... 1 tab once daily    Ramipril 10 Mg Caps (Ramipril) .Marland Kitchen... 1 cap once daily  Problem # 2:  DYSPNEA (ICD-786.05) Will get echocardiogram to assess LV size and function as well as to exclude any significant valvular disease as an etiology of her dyspnea.  I will see her back in 2-3 weeks to review the tests and will alert Dr. Charlann Boxer and Dr. Drue Second  of recommendations prior to the right knee replacement.   Her updated medication list for this problem includes:    Norvasc 5 Mg Tabs (Amlodipine besylate) .Marland Kitchen... 1 tab once daily    Ramipril 10 Mg Caps (Ramipril) .Marland Kitchen... 1 cap once daily    Furosemide 20 Mg Tabs (Furosemide) .Marland Kitchen... 1 tab once daily as needed  Problem # 3:  LEG EDEMA (ICD-782.3) Most likely dependent edema. Exam not c/w DVT.  Elevate legs during the day.   Other Orders: Nuclear Stress Test (Nuc Stress Test) Echocardiogram (Echo)  Patient Instructions: 1)  Your physician recommends that you schedule a follow-up appointment in: 2-3 weeks 2)  Your physician has requested that you have an echocardiogram.  Echocardiography is a painless test that uses sound waves to create images of your heart. It provides your doctor with information about the size and shape of your heart and how well your heart's chambers and valves are working.  This procedure takes approximately one hour. There are no restrictions for this procedure. 3)  Your physician has requested that you have an adenosine myoview.  For further information please visit [email or web address]  Please follow instruction sheet, as given.

## 2010-06-08 NOTE — Consult Note (Signed)
Summary: [facility] Medicine Referral Form  [facility] Family Medicine Referral Form   Imported By: Roderic Ovens 07/30/2009 14:51:09  _____________________________________________________________________  External Attachment:    Type:   Image     Comment:   External Document

## 2010-06-17 NOTE — Discharge Summary (Signed)
NAME:  Anne, Bryant NO.:  [id number]  MEDICAL RECORD NO.:  [id number]          PATIENT TYPE:  INP  LOCATION:  6523                         FACILITY:  [facility]  PHYSICIAN:  Marca Ancona, MD      DATE OF BIRTH:  Aug 11, 1945  DATE OF ADMISSION:  05/20/2010 DATE OF DISCHARGE:  05/21/2010                              DISCHARGE SUMMARY   PRIMARY CARDIOLOGIST:  Verne Carrow, MD  PRIMARY CARE PROVIDER:  Dalbert Mayotte, MD.  DISCHARGE DIAGNOSES: 1. Chest pain, noncardiac.     a.     Status post cardiac catheterization showing no coronary      artery disease. 2. Hypertension. 3. Hyperlipidemia.  SECONDARY DIAGNOSES: 1. Fibromyalgia. 2. Depression/anxiety. 3. Obstructive sleep apnea, nightly CPAP use. 4. Irritable bowel syndrome. 5. Iron deficiency anemia. 6. Restless leg syndrome. 7. Asthma.  PROCEDURE/DIAGNOSTICS PERFORMED DURING HOSPITALIZATION: 1. Left heart catheterization with selective coronary angiography.     a.     No coronary artery disease. 2. Echocardiogram on May 20, 2010, showing mild left ventricular     hypertrophy.  Left ventricular ejection fraction was normal in the     range of 55-60%. 3. Chest x-ray showing no acute cardiopulmonary disease.  ALLERGIES: 1. AZITHROMYCIN. 2. LYRICA.  HISTORY OF PRESENT ILLNESS:  This is a 65 year old female with the above- stated problem list who presented to [facility] with chest pain of unclear etiology.  Her EKG did show new anterolateral T-wave inversions. Her initial point-of-care markers had an elevated troponin of 0.1.  A stat echocardiogram was completed in the emergency department that showed no wall motion abnormalities or proximal aortic root dissection. With the patient's chest pain and diagnostic study, she was admitted for further evaluation.  HOSPITAL COURSE:  The patient continued to have chest pain.  Further to know if this was atypical in nature with her EKG changes,  diagnostic catheterization was indicated.  Of note, the patient's initial point-of- care markers were elevated but remainder cardiac enzymes were negative. Risks, benefits, and indications catheterization were discussed with the patient and her husband, they agreed to proceed.  Dr. Shirlee Latch brought the patient to the cath lab where informed consent was obtained.  As above, catheterization showed no evidence of coronary artery disease.  The patient tolerated the procedure well.  Risk factor modifications were encouraged.  The patient was taken to the radial round for observation and ambulation.  Of note, the patient was hypertensive upon admission.  She was placed on IV nitroglycerin during admission.  She will be discharged on Norvasc.  No beta-blocker was added secondary to the patient's asthma.  She will be continued on her ramipril.  Dr. Shirlee Latch felt the patient is stable for home after bedrest and ambulation.  The patient is hemodynamically stable.  Her radial site is without signs of hematoma.  DISCHARGE LABS:  WBC 7.5, hemoglobin 10.6, hematocrit 34.2, platelets 193,000.  Sodium 139, potassium 3.8, chloride 104, bicarb 29, BUN 5, creatinine 0.64, cholesterol 198, triglycerides 767, HDL 35.  DISCHARGE MEDICATIONS: 1. Amlodipine 10 mg 1 tablet daily. 2. Alprazolam 0.5 mg 4 times a day. 3. Omeprazole 20 mg twice daily. 4. Ramipril  10 mg daily. 5. Cymbalta 60 mg twice daily. 6. Imitrex 0.5 for 1 tablet every 2 hours as needed at onset of     migraines. 7. Prednisone 5 mg 1/2 tablet daily. 8. Gabapentin 300 mg nightly. 9. TriCor 145 mg daily. 10.Bupropion 75 mg 1 tablet daily. 11.Celebrex 200 mg 1 tablet daily. 12.Dicyclomine 10 mg 1-2 tablets 3 times a day for irritable bowel. 13.Vitamin D2 5000 units 1 capsule every 2 weeks. 14.Advair 250/50, 1 puff inhaled twice daily. 15.Albuterol inhaler 1-2 puffs inhaled daily for shortness of breath. 16.QVAR inhalation 2 puffs inhaled  twice daily. 17.Hydrocodone/APAP 10/500 mg 1 tablet 4 times a day for pain.  FOLLOWUP PLANS AND INSTRUCTIONS: 1. The patient will follow up with Dr. Clifton James as previously     scheduled. 2. The patient will follow up with Dr. Drue Second within 2-3 weeks. 3. The patient is to continue low-sodium heart-healthy and diabetic     diet. 4. The patient to increase activity slowly.  She may shower but no     bathing.  She is not to lift anything greater     than 5 pounds for 1 week.  She is to call our office for any     problems. 5. Avoid any activity that causes chest pain, dizziness, or shortness     of breath.  DURATION OF DISCHARGE:  Greater than 30 minutes with the physician and physician extender time.     [name], PA-C   ______________________________ Marca Ancona, MD    NB/MEDQ  D:  05/21/2010  T:  05/22/2010  Job:  [id number]  cc:   Dalbert Mayotte, M.D. Verne Carrow, MD  Electronically Signed by Alen Blew P.A. on 05/24/2010 06:46:54 AM Electronically Signed by Marca Ancona MD on 06/17/2010 56:21:30 AM

## 2010-06-17 NOTE — Procedures (Signed)
  NAME:  Anne Bryant, Anne Bryant NO.:  [id number]  MEDICAL RECORD NO.:  [id number]          PATIENT TYPE:  INP  LOCATION:  6523                         FACILITY:  [facility]  PHYSICIAN:  Marca Ancona, MD      DATE OF BIRTH:  1945-06-13  DATE OF PROCEDURE:  05/21/2010 DATE OF DISCHARGE:                           CARDIAC CATHETERIZATION   PROCEDURES: 1. Left heart catheterization. 2. Coronary angiography. 3. Left ventriculography.  INDICATION:  This is a 65 year old with coronary risk factors who presented with multiple episodes of chest pain.  She has had a negative Myoview in the past.  It was ultimately decided that she would undergo left heart catheterization.  PROCEDURE NOTE:  After informed consent was obtained, the patient underwent Allen testing of the right wrist.  The Allen test was positive signifying good collateral circulation from the ulnar artery to the radial side of the hand.  The right wrist was sterilely prepped and draped.  Lidocaine 1% was used to locally anesthetize the right wrist area.  The right radial artery was entered using modified Seldinger technique and a 5-French arterial sheath was placed.  The patient received 3 mg of intra-arterial verapamil as well as 4000 units of IV heparin.  The right coronary artery was engaged using JR-4 catheter. The left coronary artery was engaged using JR-3.5 catheter.  Left ventricle was entered using angled pigtail catheter.  There were no complications.  FINDINGS: 1. Hemodynamics, LV 146/18, aorta 147/90. 2. Left ventriculography.  EF was estimated at 60%.  There were no     regional wall motion abnormalities. 3. Right coronary artery.  The right coronary artery was a dominant     vessel.  There was no angiographic coronary artery disease. 4. Left main.  The left main had no angiographic coronary artery     disease. 5. Left circumflex.  The left circumflex had no angiographic coronary     artery  disease. 6. LAD system.  The LAD has diagonals, had no angiographic coronary     artery disease.  IMPRESSION:  This is a 65 year old who presented with noncardiac chest pain. There is no angiographic coronary artery disease, left ventricular systolic function is normal.  She does need blood pressure control and she will go home on ramipril and Norvasc.     Marca Ancona, MD     DM/MEDQ  D:  05/21/2010  T:  05/22/2010  Job:  [id number]  cc:   Dalbert Mayotte, MD  Electronically Signed by Marca Ancona MD on 06/17/2010 08:26:50 AM

## 2010-06-24 NOTE — Consult Note (Signed)
[facility]: [facility]   Imported By: Marylou Mccoy 06/17/2010 09:19:02  _____________________________________________________________________  External Attachment:    Type:   Image     Comment:   External Document

## 2010-07-01 ENCOUNTER — Encounter: Payer: Self-pay | Admitting: Cardiovascular Disease

## 2010-07-01 ENCOUNTER — Encounter (INDEPENDENT_AMBULATORY_CARE_PROVIDER_SITE_OTHER): Payer: Medicare Other | Admitting: Cardiovascular Disease

## 2010-07-01 DIAGNOSIS — R072 Precordial pain: Secondary | ICD-10-CM

## 2010-07-01 DIAGNOSIS — R609 Edema, unspecified: Secondary | ICD-10-CM

## 2010-07-06 NOTE — Assessment & Plan Note (Signed)
Summary: EPH PER NIGHT MESSAGE/LG/AC   Visit Type:  eph Primary Provider:  [facility]  CC:  sob and edema in legs and ankles. pt needs clarification of hospital results..  History of Present Illness: 65 yo WF with multiple medical problems including borderline sleep apnea, DM, HTN, hyperlipidemia, obesity, sedentary lifestyle here today for cardiac follow up. I saw her as a new patient in February 2011 for cardiac assessment prior to planned right knee replacement. She recently had a sleep study and was found to have apneic periods with desaturation. She has been wearing CPAP.  At the first visit last year, she described fatigue and chest pain. Stress testing and echo were ordered. LV systolic function was normal. Stress test showed no evidence of ischemia. She was admitted to [facility] on 05/20/10 with chest pain. Cardiac enzymes were negative. Cardiac cath on 05/21/10 with no evidence of CAD. There was no evidence of CAD. Her LV function was normal.   Current Medications (verified): 1)  Norvasc 5 Mg Tabs (Amlodipine Besylate) .Marland Kitchen.. 1 Tab Once Daily 2)  Ramipril 10 Mg Caps (Ramipril) .Marland Kitchen.. 1 Cap Once Daily 3)  Cymbalta 60 Mg Cpep (Duloxetine Hcl) .... 2 Cap Once Daily 4)  Xanax 1 Mg Tabs (Alprazolam) .Marland Kitchen.. 1 Tab Three Times A Day 5)  Wellbutrin Xl 300 Mg Xr24h-Tab (Bupropion Hcl) .Marland Kitchen.. 1 Tab Once Daily 6)  Vicodin 5-500 Mg Tabs (Hydrocodone-Acetaminophen) .... As Needed 7)  Promethazine Hcl 25 Mg Tabs (Promethazine Hcl) .... As Needed 8)  Vitamin D (Ergocalciferol) 50000 Unit Caps (Ergocalciferol) .Marland Kitchen.. 1 Cap Twice Weekly 9)  Nasonex 50 Mcg/act Susp (Mometasone Furoate) .... Use As Directed 10)  Neurontin 300 Mg Caps (Gabapentin) .Marland Kitchen.. 1 Tab Once Daily 11)  Singulair 10 Mg Tabs (Montelukast Sodium) .Marland Kitchen.. 1 Tab At Bedtime 12)  Prednisone 5 Mg Tabs (Prednisone) .Marland Kitchen.. 1 1/2 Tab Once Daily 13)  Omeprazole 20 Mg Tbec (Omeprazole) .Marland Kitchen.. 1 Tab Two Times A Day 14)  Cpap Machine With O2 ....  Use At Bedtime 15)  Allergy Relief 4 Mg Tabs (Chlorpheniramine Maleate) .... As Needed 16)  Albuterol Sulfate (2.5 Mg/15ml) 0.083% Nebu (Albuterol Sulfate) .... As Needed 17)  Macrobid 100 Mg Caps (Nitrofurantoin Monohyd Macro) .Marland Kitchen.. 1 Cap Once Daily As Needed 18)  Advair Diskus 250-50 Mcg/dose Aepb (Fluticasone-Salmeterol) .Marland Kitchen.. 1 Puff Two Times A Day, As Needed 19)  Mirapex 0.5 Mg Tabs (Pramipexole Dihydrochloride) .... Take 1 Tablet By Mouth Once A Day At Bedtime 20)  Excedrin Migraine 250-250-65 Mg Tabs (Aspirin-Acetaminophen-Caffeine) .... As Needed 21)  Tylenol 325 Mg Tabs (Acetaminophen) .... As Needed 22)  Lasix 20 Mg Tabs (Furosemide) .... Take 1 Tablet By Mouth Once A Day 23)  Hydrocodone-Acetaminophen 10-325 Mg Tabs (Hydrocodone-Acetaminophen) .... Three Times A Day As Needed  Allergies (verified): 1)  ! Zoloft 2)  ! * Lyrica 3)  ! * Lexapro 4)  ! Zithromax  Past History:  Past Medical History: HYPERTENSION (ICD-401.9) HYPERLIPIDEMIA (ICD-272.4) LEG EDEMA (ICD-782.3) LEUKOCYTOSIS (ICD-288.60) DM (ICD-250.00) IMPAIRED FASTING GLUCOSE (ICD-790.21) EPISTAXIS (ICD-784.7) MIGRAINE HEADACHE (ICD-346.90) ANXIETY DISORDER (ICD-300.00) DEPRESSION (ICD-311) FIBROMYALGIA (ICD-729.1) PANIC ATTACK (ICD-300.01) ASTHMA (ICD-493.90) RHEUMATOID ARTHRITIS (ICD-714.0) OSTEOARTHRITIS (ICD-715.90) Recurrent UTI GERD No evidence of CAD by cath January 2012  Social History: Reviewed history from 06/23/2009 and no changes required. Disabled  Married , 2 children Tobacco Use - No.  Alcohol Use - no Regular Exercise - no Drug Use - no  Vital Signs:  Patient profile:   65 year old female Height:  62 inches Weight:      180.75 pounds BMI:     33.18 Pulse rate:   84 / minute Resp:     18 per minute BP sitting:   108 / 72  (left arm) Cuff size:   large  Vitals Entered By: Celestia Khat, CMA (July 01, 2010 2:09 PM)   EKG  Procedure date:  07/01/2010  Findings:       sinus rhythm, rate 88 bpm. Non-specific T wave abnormalities.   Cardiac Cath  Procedure date:  05/21/2010  Findings:       1. Hemodynamics, LV 146/18, aorta 147/90.   2. Left ventriculography.  EF was estimated at 60%.  There were no       regional wall motion abnormalities.   3. Right coronary artery.  The right coronary artery was a dominant       vessel.  There was no angiographic coronary artery disease.   4. Left main.  The left main had no angiographic coronary artery       disease.   5. Left circumflex.  The left circumflex had no angiographic coronary       artery disease.   6. LAD system.  The LAD has diagonals, had no angiographic coronary       artery disease.      Impression & Recommendations:  Problem # 1:  CHEST PAIN-PRECORDIAL (ICD-786.51) Recent hospitalization for chest pain. Cardiac cath with normal coronary arteries. LV function normal. No further cardiac workup. Her updated medication list for this problem includes:    Norvasc 5 Mg Tabs (Amlodipine besylate) .Marland Kitchen... 1 tab once daily    Ramipril 10 Mg Caps (Ramipril) .Marland Kitchen... 1 cap once daily  Problem # 2:  LEG EDEMA (ICD-782.3) Continue low dose Lasix. Elevate legs when sitting. Likely dependent edema.   Patient Instructions: 1)  Your physician recommends that you schedule a follow-up appointment as needed.

## 2010-08-18 LAB — BASIC METABOLIC PANEL
BUN: 10 mg/dL (ref 6–23)
BUN: 12 mg/dL (ref 6–23)
BUN: 11 mg/dL (ref 6–23)
CO2: 27 mEq/L (ref 19–32)
CO2: 29 mEq/L (ref 19–32)
Chloride: 104 mEq/L (ref 96–112)
Creatinine, Ser: 0.81 mg/dL (ref 0.4–1.2)
GFR calc non Af Amer: >60 mL/min (ref >60)
Glucose, Bld: 129 mg/dL — ABNORMAL HIGH (ref 70–99)
Glucose, Bld: 148 mg/dL — ABNORMAL HIGH (ref 70–99)
Glucose, Bld: 82 mg/dL (ref 70–99)
Potassium: 4.1 mEq/L (ref 3.5–5.1)
Potassium: 4.1 mEq/L (ref 3.5–5.1)
Sodium: 139 mEq/L (ref 135–145)

## 2010-08-18 LAB — TYPE AND SCREEN

## 2010-08-18 LAB — HEMOGLOBIN AND HEMATOCRIT, BLOOD: Hemoglobin: 9.7 g/dL — ABNORMAL LOW (ref 12.0–15.0)

## 2010-08-18 LAB — CBC
HCT: 25.1 % — ABNORMAL LOW (ref 36.0–46.0)
HCT: 36.8 % (ref 36.0–46.0)
Hemoglobin: 8 g/dL — ABNORMAL LOW (ref 12.0–15.0)
MCHC: 32 g/dL (ref 30.0–36.0)
MCHC: 33.2 g/dL (ref 30.0–36.0)
MCV: 67.9 fL — ABNORMAL LOW (ref 78.0–100.0)
MCV: 73.1 fL — ABNORMAL LOW (ref 78.0–100.0)
MCV: 67.7 fL — ABNORMAL LOW (ref 78.0–100.0)
Platelets: 348 10*3/uL (ref 150–400)
RBC: 4.3 MIL/uL (ref 3.87–5.11)
RDW: 16.5 % — ABNORMAL HIGH (ref 11.5–15.5)
RDW: 21.4 % — ABNORMAL HIGH (ref 11.5–15.5)
RDW: 16.5 % — ABNORMAL HIGH (ref 11.5–15.5)

## 2010-08-18 LAB — DIFFERENTIAL
Basophils Absolute: 0 10*3/uL (ref 0.0–0.1)
Eosinophils Absolute: 0.2 10*3/uL (ref 0.0–0.7)
Lymphocytes Relative: 24 % (ref 12–46)
Lymphs Abs: 2.3 10*3/uL (ref 0.7–4.0)
Neutro Abs: 6.5 10*3/uL (ref 1.7–7.7)

## 2010-08-20 ENCOUNTER — Other Ambulatory Visit: Payer: Self-pay | Admitting: Orthopedic Surgery

## 2010-08-20 ENCOUNTER — Ambulatory Visit
Admission: RE | Admit: 2010-08-20 | Discharge: 2010-08-20 | Disposition: A | Discharge status: Home / Self Care | Payer: Medicare Other | Source: Ambulatory Visit | Attending: Orthopedic Surgery | Admitting: Orthopedic Surgery

## 2010-08-20 DIAGNOSIS — M25561 Pain in right knee: Secondary | ICD-10-CM

## 2010-08-20 DIAGNOSIS — M25551 Pain in right hip: Secondary | ICD-10-CM

## 2010-08-24 LAB — CBC
HCT: 40.3 % (ref 36.0–46.0)
HCT: 30.1 % — ABNORMAL LOW (ref 36.0–46.0)
HCT: 32.3 % — ABNORMAL LOW (ref 36.0–46.0)
Hemoglobin: 12.7 g/dL (ref 12.0–15.0)
Hemoglobin: 10.3 g/dL — ABNORMAL LOW (ref 12.0–15.0)
MCHC: 31.5 g/dL (ref 30.0–36.0)
MCHC: 31.6 g/dL (ref 30.0–36.0)
MCV: 68.5 fL — ABNORMAL LOW (ref 78.0–100.0)
MCV: 69 fL — ABNORMAL LOW (ref 78.0–100.0)
Platelets: 288 10*3/uL (ref 150–400)
Platelets: 236 10*3/uL (ref 150–400)
Platelets: 284 10*3/uL (ref 150–400)
RBC: 4.4 MIL/uL (ref 3.87–5.11)
RBC: 4.68 MIL/uL (ref 3.87–5.11)
RDW: 15.3 % (ref 11.5–15.5)
WBC: 10.2 10*3/uL (ref 4.0–10.5)
WBC: 14.2 10*3/uL — ABNORMAL HIGH (ref 4.0–10.5)

## 2010-08-24 LAB — BASIC METABOLIC PANEL
BUN: 13 mg/dL (ref 6–23)
BUN: 12 mg/dL (ref 6–23)
BUN: 14 mg/dL (ref 6–23)
CO2: 29 mEq/L (ref 19–32)
CO2: 28 mEq/L (ref 19–32)
Chloride: 105 mEq/L (ref 96–112)
Chloride: 108 mEq/L (ref 96–112)
Creatinine, Ser: 0.91 mg/dL (ref 0.4–1.2)
GFR calc Af Amer: >60 mL/min (ref >60)
GFR calc non Af Amer: >60 mL/min (ref >60)
GFR calc non Af Amer: >60 mL/min (ref >60)
Glucose, Bld: 135 mg/dL — ABNORMAL HIGH (ref 70–99)
Potassium: 4.4 mEq/L (ref 3.5–5.1)
Potassium: 4.1 mEq/L (ref 3.5–5.1)
Potassium: 4.8 mEq/L (ref 3.5–5.1)
Sodium: 143 mEq/L (ref 135–145)
Sodium: 138 mEq/L (ref 135–145)

## 2010-08-24 LAB — DIFFERENTIAL
Basophils Absolute: 0 10*3/uL (ref 0.0–0.1)
Eosinophils Absolute: 0.1 10*3/uL (ref 0.0–0.7)
Lymphs Abs: 1.5 10*3/uL (ref 0.7–4.0)
Monocytes Absolute: 0.5 10*3/uL (ref 0.1–1.0)
Monocytes Relative: 4 % (ref 3–12)
Neutro Abs: 9.6 10*3/uL — ABNORMAL HIGH (ref 1.7–7.7)

## 2010-08-24 LAB — URINALYSIS, ROUTINE W REFLEX MICROSCOPIC
Ketones, ur: NEGATIVE mg/dL
Nitrite: POSITIVE — AB
Specific Gravity, Urine: 1.012 (ref 1.005–1.030)
Urobilinogen, UA: 1 mg/dL (ref 0.0–1.0)
pH: 6 (ref 5.0–8.0)

## 2010-08-24 LAB — URINE MICROSCOPIC-ADD ON

## 2010-08-24 LAB — ABO/RH: ABO/RH(D): A POS

## 2010-08-24 LAB — TYPE AND SCREEN

## 2010-09-21 NOTE — H&P (Signed)
NAME:  Anne Bryant, TRIAS         ACCOUNT NO.:  [id number]   MEDICAL RECORD NO.:  [id number]          PATIENT TYPE:  INP   LOCATION:                               FACILITY:  [facility]   PHYSICIAN:  Madlyn Frankel. Charlann Boxer, M.D.  DATE OF BIRTH:  1946-02-26   DATE OF ADMISSION:  08/26/2008  DATE OF DISCHARGE:                              HISTORY & PHYSICAL   PROCEDURE:  Revision left total hip replacement.   ATTENDING PHYSICIAN:  Madlyn Frankel. Charlann Boxer, M.D.   CHIEF COMPLAINT AND REASON FOR ADMISSION:  Left periprosthetic femur  fracture with subsidence of the femoral stem.   HISTORY OF PRESENT ILLNESS:  A 65 year old female with a history of a  left total hip replacement on June 30, 2008.  Postoperatively she  fell at home.  She was seen in our office where x-rays revealed a  periprosthetic femur fracture with subsidence of the femoral stem.  She  is to be admitted for revision surgery due to loosening.   PRIMARY CARE PHYSICIAN:  Dalbert Mayotte, M.D.   PAST MEDICAL HISTORY:  1. Osteoarthritis.  2. Anxiety.  3. Depression.  4. Migraines.  5. Asthma.  6. Hypertension.  7. Dyslipidemia.  8. Reflux disease.  9. IBS.  10.Recurrent urinary tract infections.  11.Fibromyalgia.  12.Rheumatoid arthritis.   PAST SURGICAL HISTORY:  1. Left total hip replacement June 30, 2008.  2. Left knee replacement 2007.  3. Cholecystectomy.  4. Right knee surgery.  5. Colonoscopy.  6. Cataract surgery.  7. Ventral hernia repair in 1989.   FAMILY HISTORY:  Heart disease, Parkinson's, diabetes.   SOCIAL HISTORY:  Married, nonsmoker.  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   MEDICATION ALLERGIES:  LYRICA causes her to be dizzy and weak.   MEDICATIONS:  1. Omeprazole 20 mg p.o. b.i.d.  2. Prednisone 7.5 mg once in the morning, take with food.  3. Amlodipine 5 mg p.o. daily.  4. Bupropion XL 150 mg p.o. daily in the morning.  5. Ramipril 10 mg one p.o. daily.  6. Cymbalta 60 mg two capsules once daily with  food.  7. Gabapentin 300 mg one p.o. q.h.s.  8. TriCor 145 mg p.o. daily.  9. Singulair once daily.  10.Celebrex 200 mg p.o. daily.  11.Vicodin 10/660 one tablet q.6 p.r.n. pain.  12.Alprazolam 1 mg 1/2 tablet every 8 hours p.r.n.  13.Promethazine 25 mg one p.o. q.6 p.r.n. nausea or vomiting.  14.Dicyclomine 10 mg one to two tablets before meds and at bedtime as      needed.  15.Advair b.i.d.  16.Albuterol b.i.d. p.r.n.  17.Imodium 3-5 tablets as needed.  18.Lasix 20 mg one p.o. daily.   REVIEW OF SYSTEMS:  GENERAL:  She has night sweats and fatigue.  HEENT:  She has headaches and she wears dentures.  RESPIRATORY:  She has  shortness of breath on exertion and wheezing.  Last chest x-ray was done  in February 2010.  She has a phobia and extreme fear of any mask being  placed over her face.  CARDIOVASCULAR:  EKG done back in November.  GASTROINTESTINAL:  She has nausea and diarrhea.  GENITOURINARY:  She has  incontinence and increased urination at night.  She has been recently  diagnosed as having urinary tract infection and was awaiting sensitivity  cultures for definitive antibiotic prescription.  MUSCULOSKELETAL:  She  has joint pain, joint swelling, back pain, morning stiffness, muscular  weakness, left groin pain.  Otherwise, see HPI.   PHYSICAL EXAMINATION:  VITAL SIGNS:  Pulse 72, respirations 18, blood  pressure 134/84.  GENERAL:  Awake, alert and oriented.  HEENT:  Normocephalic.  NECK:  Supple.  No carotid bruits.  CHEST:  Lungs clear to auscultation bilaterally.  BREASTS:  Deferred.  HEART:  S1, S2 distinct.  ABDOMEN:  Soft, bowel sounds present.  PELVIS:  Stable.  GENITOURINARY:  Deferred.  EXTREMITIES:  Left hip has increased pain with range of motion and  weightbearing.  SKIN:  No cellulitis.  Posterolateral incision scar.  NEUROLOGICAL:  Intact distal sensibilities.   LABORATORY DATA:  Pending.   EKG, chest x-ray done within the last 6 months prior to her  previous  surgery.   IMPRESSION:  Left periprosthetic femur fracture with subsidence of the  femoral stem.   PLAN OF ACTION:  Revision left total hip replacement by Dr. Charlann Boxer at  [facility] on August 26, 2008.  Risks and complications were  discussed.   NOTE:  Postoperative medications were provided today.     ______________________________  Yetta Glassman. [address], [place]      Madlyn Frankel. Charlann Boxer, M.D.  Electronically Signed    BLM/MEDQ  D:  08/21/2008  T:  08/21/2008  Job:  [id number]   cc:   Dalbert Mayotte, M.D.

## 2010-09-21 NOTE — H&P (Signed)
NAME:  ARNETA, MAHMOOD NO.:  [id number]   MEDICAL RECORD NO.:  [id number]          PATIENT TYPE:  OBV   LOCATION:                               FACILITY[facility]  [facility]   PHYSICIAN:  Erasmo Leventhal, M.D.DATE OF BIRTH:  1946/03/02   DATE OF ADMISSION:  09/06/2008  DATE OF DISCHARGE:  09/07/2008                              HISTORY & PHYSICAL   A 23-hour observation admission note.   A 65 year old female patient of Dr. Charlann Boxer status post left hip revision  and total hip replacement August 26, 2008.  She did well until today.  When the doorbell rang she quickly jerked to the right to get up and  felt left thigh, hip and some left low back pain.  She has tried to take  pain medicine today and has been unable to ambulate and weightbear due  to the knee buckling and hip pain and presented to [facility] Emergency  Room by EMS.  There, she was seen by the [employer].  Dr.  Mayford Knife had asked me to evaluate the patient.  She was given pain  medicine and was still unable to get up and take care of herself.  She  complains of only left thigh, lateral hip and some left lower back pain.  No numbness or tingling.  No bowel or bladder problems.   ALLERGIES:  None.   CURRENT MEDICATIONS:  Multiple.  These include Midrin, Norco,  gabapentin, amlodipine, ramipril, Wellbutrin, __________ , Bentyl,  Cymbalta, omeprazole, methocarbamol, aspirin, prednisone, TriCor,  cetirizine, Singulair, Lasix, and Benadryl.   PAST MEDICAL HISTORY:  Significant for:  1. Osteoarthritis and rheumatoid arthritis.  2. Asthma.  3. Hypertension.  4. History of panic attacks.   SURGICAL HISTORY:  As noted above.   FAMILY HISTORY:  Noncontributory.   SOCIAL HISTORY:  Married.  Lives with the husband.  No tobacco.  No  alcohol.   REVIEW OF SYSTEMS:  Unremarkable.   PHYSICAL EXAMINATION:  Temperature 98.3 oral, blood pressure 133/72,  pulse 86 and regular, respirations 18 and unlabored.  GENERAL:  Well-developed, well-nourished, overweight female in no acute  distress.  Very pleasant and cooperative.  HEENT:  Normocephalic, atraumatic.  Conjunctivae moist and pink.  NECK:  Supple, full range of motion without thyromegaly, JVD or bruits.  CHEST:  Clear to auscultation.  HEART:  Regular rhythm without murmurs, gallops or rubs.  ABDOMEN:  Flat, normal bowel sounds.  PELVIC/RECTAL:  Deferred.  EXTREMITIES:  Left lower extremity:  Normal rotation and length.  Hip  wound excellent.  No swelling or erythema.  Noticeable amount of left  anterior thigh tenderness.  Mild lateral hip tenderness.  Mild left  lower back tenderness.  Can do a straight leg raise on the left actively  with difficulty.  Extensor mechanism is intact.  Gentle roll on the hip  is minimally painful.  Passively/actively is painful.  Unable to get up  and ambulate.  NEUROLOGICALLY:  Intact.  VASCULAR EXAMINATION:  Intact.   Plain x-ray of the pelvis and hip examined.  These are unremarkable,  showing well positioned total hip arthroplasty.  Compared to August 26, 2008 x-ray postop, no change.   IMPRESSION:  Acute left thigh, hip and lumbosacral strain/sprain, status  post revision total hip arthroplasty, unable to ambulate secondary to  soft tissue injury.   RECOMMENDATIONS:  Twenty-three hour observation.  Pain management.  Knee  immobilizer.  Will try and ambulate to keep the knee from buckling.  Physical therapy in the morning.  If okay, discharge home tomorrow.  If  not, she will be kept another night.  All questions encouraged and  answered with the patient and her husband.           ______________________________  Erasmo Leventhal, M.D.     RAC/MEDQ  D:  09/06/2008  T:  09/06/2008  Job:  [id number]

## 2010-09-21 NOTE — H&P (Signed)
NAME:  Anne Bryant, Anne Bryant NO.:  [id number]   MEDICAL RECORD NO.:  [id number]          PATIENT TYPE:  INP   LOCATION:                               FACILITY:  [facility]   PHYSICIAN:  Madlyn Frankel. Charlann Boxer, M.D.  DATE OF BIRTH:  December 02, 1945   DATE OF ADMISSION:  06/30/2008  DATE OF DISCHARGE:                              HISTORY & PHYSICAL   PROCEDURE:  Left total hip replacement.   CHIEF COMPLAINT:  Left hip pain.   HISTORY OF PRESENT ILLNESS:  This is a 65 year old female with a history  of left hip pain secondary to degenerative joint disease.  It has been  refractory to all conservative treatment.  She had been presurgically  assessed prior to surgery by her primary care physician, Dr. Dalbert Mayotte.   PAST MEDICAL HISTORY:  Significant for:  1. Osteoarthritis.  2. Anxiety/depression.  3. Migraines.  4. Asthma.  5. Hypertension.  6. Dyslipidemia.  7. Reflux disease.  8. IBS.  9. Recurrent urinary tract infections.  10.Fibromyalgia.  11.Rheumatoid arthritis.   PAST SURGICAL HISTORY:  1. Cholecystectomy.  2. Right knee surgery.  3. Colonoscopy.  4. Cataract surgery.  5. Left knee replacement in 2007.  6. Ventral hernia repair in 1989.   FAMILY HISTORY:  Heart disease, Parkinson's, diabetes.   SOCIAL HISTORY:  Married.  Nonsmoker.  Caregiver will be husband versus  SNF rehab postoperatively.   MEDICATION ALLERGIES:  LYRICA causes her to be dizzy and weak.   MEDICATIONS:  1. Omeprazole 20 mg p.o. b.i.d.  2. Prednisone 7.5 mg once in the morning, take with food.  3. Amlodipine 5 mg daily.  4. Bupropion XL 150 mg p.o. once daily in the morning.  5. Ramipril 10 mg 1 p.o. daily.  6. Cymbalta 60 mg 2 capsules once daily with food.  7. Gabapentin 300 mg 1 p.o. q.h.s.  8. Tricor 145 mg p.o. daily.  9. Singulair once daily.  10.Celebrex 200 mg p.o. daily.  11.Hydrocodone 10/660 one tablet every 6 hours p.r.n. pain.  12.Alprazolam 1 mg, half tablet every 8  hours p.r.n.  13.Promethazine 25 mg 1 p.o. q.6 p.r.n. nausea or vomiting.  14.Dicyclomine 10 mg 1-2 tablets before meds and at bedtime as needed.  15.Advair b.i.d.  16.Albuterol b.i.d. p.r.n.  17.Imodium AD 3-5 tablets as needed.  18.Furosemide 20 mg 1 p.o. daily.   REVIEW OF SYSTEMS:  GENERAL:  She has night sweats, fatigue.  HEENT:  She has headaches and she wears dentures.  RESPIRATORY:  She has  shortness of breath on exertion and wheezing.  Last chest x-ray was done  February 2010.  CARDIOVASCULAR:  Had EKG done back in November.  GASTROINTESTINAL:  She has nausea and diarrhea.  GENITOURINARY:  She has  incontinence and increased urination at night.  MUSCULOSKELETAL:  She  has joint pain, joint swelling, back pain, morning stiffness, muscular  weakness.  Otherwise see HPI.   PHYSICAL EXAMINATION:  Pulse 72, respirations 16, blood pressure 124/76.  GENERAL:  Awake, alert and oriented.  HEENT:  Normocephalic.  NECK:  Supple.  No carotid bruits.  CHEST:  Lungs clear  to auscultation bilaterally.  Breath sounds  diminished.  BREASTS:  Deferred.  HEART:  S1-S2 distinct.  ABDOMEN:  Soft, bowel sounds present.  PELVIS:  Stable.  GENITOURINARY:  Deferred.  EXTREMITIES:  Left hip has increased pain with range of motion.  SKIN:  No cellulitis.  NEUROLOGIC:  Intact distal sensibilities.   LABORATORY DATA:  Labs, EKG, chest x-ray all pending presurgical  testing.   IMPRESSION:  Left hip osteoarthritis.   PLAN OF ACTION:  Left total hip replacement by Dr. Charlann Boxer at [facility] June 30, 2008.  Risks and complications were discussed.   No postoperative medications were provided as patient is home versus SNF  based upon progress in hospital.     ______________________________  Yetta Glassman. [address], [place]      Madlyn Frankel. Charlann Boxer, M.D.  Electronically Signed    BLM/MEDQ  D:  06/25/2008  T:  06/25/2008  Job:  [id number]   cc:   Dalbert Mayotte, M.D.   Conchita Paris  Fax:  [phone]

## 2010-09-21 NOTE — Discharge Summary (Signed)
NAME:  Anne Bryant, Anne Bryant         ACCOUNT NO.:  [id number]   MEDICAL RECORD NO.:  [id number]          PATIENT TYPE:  INP   LOCATION:  1605                         FACILITY:  [facility]   PHYSICIAN:  Madlyn Frankel. Charlann Boxer, M.D.  DATE OF BIRTH:  01-05-1946   DATE OF ADMISSION:  08/26/2008  DATE OF DISCHARGE:                               DISCHARGE SUMMARY   ADMISSION DIAGNOSES:  1. Osteoarthritis.  2. Anxiety.  3. Depression.  4. Migraines.  5. Asthma.  6. Hypertension.  7. Dyslipidemia.  8. Reflux disease.  9. Irritable bowel syndrome.  10.Recurrent urinary tract infections.  11.Fibromyalgia.  12.Rheumatoid arthritis.   DISCHARGE DIAGNOSES:  1. Osteoarthritis.  2. Anxiety.  3. Depression.  4. Migraines.  5. Asthma.  6. Hypertension.  7. Dyslipidemia.  8. Reflux disease.  9. Irritable bowel syndrome.  10.Recurrent urinary tract infections.  11.Fibromyalgia.  12.Rheumatoid arthritis.  13.Acute blood loss anemia.   HISTORY OF PRESENT ILLNESS:  A 65 year old female with a history of a  left total hip replacement on June 30, 2008.  Postoperatively, she  fell at home.  She was seen in our office.  X-rays revealed a  periprosthetic femur fracture with suprafemoral stent.  She was  readmitted to the hospital for revision and left total hip replacement  surgery.   PROCEDURE:  Revision left total hip replacement by surgeon Dr. Durene Romans and assistant Sharlet Salina, PA-C.   CONSULTATIONS:  None.   LABORATORY DATA:  Cbc final reading showed hemoglobin of 9.7 and  hematocrit 29.9.  Metabolic:  Sodium 138, potassium 4, BUN 10,  creatinine 0.81, and glucose 129.   RADIOLOGY:  Portable pelvis showed satisfactory appearance after left  total hip arthroplasty.   Chest 2-view done on June 23, 2008, showed mild cardiomegaly.  No  acute chest findings at the time.   HOSPITAL COURSE:  The patient was admitted to the hospital and underwent  revision left total hip replacement.   Hemodynamically, she did have  acute blood loss anemia, was transfused 2 units.  Otherwise remained  stable metabolically.  Her dressing was changed.  No significant  drainage from the wound.  She remained neurovascularly intact to her  left lower extremity throughout her course of stay.  She had improving  function, although did require further rehab assistance prior to safe  discharge home.  She was partial weightbearing 50%, and when seen on the  23rd, she was stable and ready for discharge to skilled nursing facility  rehab for further progress.   DISCHARGE DISPOSITION:  Discharged to skilled nursing facility rehab in  stable and improved condition.   DISCHARGE PHYSICAL THERAPY:  Partial weightbearing 25% to 50% of left  lower extremity.   DISCHARGE DIET:  Heart healthy.   DISCHARGE WOUND CARE:  Keep dry.   DISCHARGE MEDICATIONS:  1. Lovenox 40 mg subcutaneously q.24h. x10 days.  2. May start enteric coated aspirin 325 mg 1 p.o. daily x4 weeks after      Lovenox completed.  3. Robaxin 500 mg 1 p.o. q.6h. p.r.n. muscle spasm and pain.  4. Norco 7.5/325 1 to 2 p.o. q.4-6h. p.r.n. pain.  5. Iron 325 mg 1 to 3 times daily x3 weeks as tolerated, as the      patient gets diarrhea.  6. Colace 100 mg p.o. b.i.d. p.r.n.  7. Bupropion 150 mg 1 p.o. daily.  8. Ramipril 10 mg 1 p.o. q.a.m.  9. Omeprazole 20 mg p.o. b.i.d.  10.Amlodipine 5 mg 1 p.o. daily.  11.Cymbalta 60 mg 2 p.o. daily.  12.TriCor 145 mg p.o. q.p.m.  13.Gabapentin 300 mg 1 p.o. q.h.s.  14.Prednisone 5 mg 1-1/2 tablets daily.  15.Promethazine 25 mg 1 p.o. q.6h. p.r.n. nausea and vomiting.  16.Alprazolam 1 mg 1/2 tablet up to 4 times daily p.r.n.  17.Dicyclomine 10 mg 1 to 2 p.r.n.  18.Lasix 20 mg p.o. daily.  19.Cetirizine 10 mg p.o. q.h.s.  20.Vitamin D daily.  21.Vitamin B daily.  22.Bactrim p.o. b.i.d.  23.Celebrex 200 mg 1 p.o. q.h.s.  24.Advair 250/50 two times a day.  25.Albuterol 2 puffs daily.   26.Aspirin:  See new orders.   DISCHARGE FOLLOWUP:  Follow up with Dr. Charlann Boxer at phone number [phone] in  2 weeks for wound check.   As noted in physical therapy, she is partial weightbearing and will  remain so until at least seen in our office at a 2-week visit in  followup.  Her medical doctor is Dr. Dalbert Mayotte.  Medical questions  other than orthopedic could be directed to Dr. Drue Second.     ______________________________  Yetta Glassman. [address], [place]      Madlyn Frankel. Charlann Boxer, M.D.  Electronically Signed    BLM/MEDQ  D:  08/29/2008  T:  08/29/2008  Job:  [id number]   cc:   Dalbert Mayotte, M.D.

## 2010-09-21 NOTE — Op Note (Signed)
NAME:  Anne Bryant, Anne Bryant         ACCOUNT NO.:  [id number]   MEDICAL RECORD NO.:  [id number]          PATIENT TYPE:  INP   LOCATION:  0004                         FACILITY:  [facility]   PHYSICIAN:  Madlyn Frankel. Charlann Boxer, M.D.  DATE OF BIRTH:  1946/04/20   DATE OF PROCEDURE:  06/30/2008  DATE OF DISCHARGE:                               OPERATIVE REPORT   PREOPERATIVE DIAGNOSIS:  Left hip osteoarthritis versus rheumatoid  arthritis, with collapse of femoral head.   POSTOPERATIVE DIAGNOSIS:  Left hip osteoarthritis versus rheumatoid  arthritis, with collapse of femoral head.   PROCEDURE:  Left total hip replacement.   COMPONENTS USED:  DePuy hip system with a 50 Pinnacle cup, 2 cancellous  bone screws, a 36 metal liner, a size 2 standard Tri-Lock stem, and a 36-  1.5 ceramic ball.   SURGEON:  Madlyn Frankel. Charlann Boxer, M.D.   ASSISTANT:  Yetta Glassman. Mann, P.A.-C   ANESTHESIA:  General.   BLOOD LOSS:  400 mL.   DRAINS:  One Hemovac.   COMPLICATIONS:  None.   SPECIMENS:  None.   FINDINGS:  None.   INDICATIONS FOR THE PROCEDURE:  Anne Bryant is a 65 year old female  who presented to the office just the left few months ago with some left  hip pain.  Her workup was concerning for arthritis.  However, attempts  at conservative measures failed.  She had increasing discomfort and some  radiographs were reordered, indicating an advanced collapse in a short  period of time, indicating rapidly progressing degenerative changes.  She was set up for surgical intervention and cleared medically.  Risks  of infection, DVT, component failure, need for revision were all  discussed and reviewed.  Consent was obtained.   PROCEDURE IN DETAIL:  The patient was brought to the operative theater.  Once adequate anesthesia, preoperative antibiotics, Ancef administered,  the patient was positioned into the right lateral decubitus position,  left side up.  The left lower extremity was prescrubbed, prepped and  draped in a sterile fashion.   Time-out was performed, identifying the patient' extremity and planned  procedure.  A lateral-based incision was then made for a posterior  approach to the hip.  The iliotibial band and gluteal fascia were then  incised posteriorly.  The short external rotators were taken down except  from the posterior capsule.  An L capsulotomy was then made, preserving  the posterior and superior capsule for later anatomic repair.  The hip  was dislocated.  Neck osteotomy was made until the trochanteric fossa  identified and the severe degenerative changes present.   Attention was first directed to the femur.  Femoral exposure was  obtained.  I used a drill to open up the proximal femur.  I then used a  hand reamer once and then irrigated to prevent fat emboli.  I then began  broaching with the 0 broach, setting anteversion of the hip around 20-25  degrees.  I broached up to a size 2 which felt snug within the proximal  femur.  At this point, I packed the femur off and into the acetabulum.  Acetabular exposure was obtained.  I  debrided labrum and the pulvinar  tissue as well as cartilage fragments within the joint surface.  I began  reaming with a straight reamer and a 45 and went up to a 49 reamer, with  good bony bed preparation.  The final 50-mm cup was then impacted.  It  was impacted in approximately 35 degrees of abduction and 20 degrees of  forward flexion, with a portion of the cup exposed in the superior  lateral aspect.   Two cancellous screws were then used based on the position of the cup as  well as my broaching, I went ahead and placed the final metal liner  after placing a central hole eliminator.  The hip had been irrigated at  this point.   Attention was redirected back to the femur.  A trial reduction was  carried out.  The trial 2 was placed with the standard neck, a 36-1.5  ball.  With this in place, the hip was very stable throughout the range   of motion.  The combined anteversion was 45 degrees.  There was no  evidence of impingement, subluxation.  The shuck in extension was less  than a couple of millimeters.  The leg lengths appeared to be comparable  to the down leg when I had her positioned and compared to the  preoperative position.   At this point, the trial components removed and the final 2 standard  stem was opened.  It was impacted to the level of where the broach had  been.  I went ahead and retrialed and was happy with the 1.5 in terms of  the hip stability, range of motion and length.  The final 36 Delta  ceramic 1.5 ball was then impacted onto a clean and dry trunnion.  The  hip was reduced.   The hip was irrigated again as it had been throughout the case.  A  medium Hemovac drain was placed deep.  I reapproximated the posterior  capsule and superior leaflet using #1 Vicryl.  The iliotibial band and  gluteal fascia were reapproximated over the drain with #1 Vicryl.  The  remainder of the wound was closed with 2-0 Vicryl and a 4-0 running  Monocryl.  The hip was cleaned, dried and dressed sterilely with Steri-  Strips and Mepilex dressing.  She was brought to the recovery room in  stable condition, extubated.      Madlyn Frankel Charlann Boxer, M.D.  Electronically Signed     MDO/MEDQ  D:  06/30/2008  T:  06/30/2008  Job:  [id number]

## 2010-09-21 NOTE — Op Note (Signed)
NAME:  Anne Bryant, Anne Bryant         ACCOUNT NO.:  [id number]   MEDICAL RECORD NO.:  [id number]          PATIENT TYPE:  INP   LOCATION:  1605                         FACILITY:  [facility]   PHYSICIAN:  Madlyn Frankel. Charlann Boxer, M.D.  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   PROCEDURE:  Revision left total hip replacement utilizing a AML DePuy  prosthesis 12.5 mm with a 36 1.5 ball, retaining acetabular shell and  liner.   SURGEON:  Madlyn Frankel. Charlann Boxer, M.D.   ASSISTANT:  Dwyane Luo, PA-C.   FINDINGS:  The patient was noted have a comminuted medial lesser troch  fracture.  I did reapproximate this back to the shaft and the prosthesis  using 16 gauge wire.   ANESTHESIA:  General.   BLOOD LOSS:  700 mL.   DRAINS:  One.   COMPLICATIONS:  None.   FINDINGS:  There was no concern for infection, fracture as noted above.   INDICATIONS FOR PROCEDURE:  Ms. Townsend Roger is a 65 year old female with  a history of primary left total hip replacement approximately 3 months  ago.  She had been progressing well but had noted some increasing pain  after a couple of incidents at home.  When she was last seen in the  office, radiographs were obtained which indicated the medial fracture of  the femur with subsidence with component.  Based on this we discussed  surgical intervention.  Risks, benefits were discussed and the necessity  for the surgery all reviewed.  Consent was obtained.   PROCEDURE IN DETAIL:  The  patient was brought to operative theater.  Once adequate anesthesia, preoperative antibiotics, Ancef administered  the patient was positioned in the right lateral decubitus position with  the left side up.  The left lower extremity was then pre scrubbed,  prepped and draped in sterile fashion.  Time-out was performed  identifying the patient, planned procedure and extremity.   The patient's old incision was partially utilized from the distal  aspect, distal couple thirds of it and extended distally into the mid  thigh.  Sharp dissection was carried to the iliotibial band which was  then opened.  At this point the hip joint was opened including incising  the pseudocapsule.  Hip was dislocated, fracture of the medial wall  identified.  The old ceramic ball was removed.  I used an S-ROM  extraction device and was able slap the old component out without  difficulty, no bone loss.  At this point I initially used a curette to  remove some lateral fibrinous tissue from the proximal femur.  Preoperatively we determined using AML stem and that is what  we went  ahead and prepared with and reamed on power to 112 mm reamer with good  bone collection in the distal flutes.  I then checked to make sure we  had adequate depth of penetration of the stem with a 12.5 reamer on  hand.   Due to loss of the proximal bone, there was no trial or no broaching  that was carried out.  The final size 12.5 AML stem was then opened  utilizing the reamer to help orient my anteversion, the final 12.5  component was impacted with a multitude of small hits in order to get it  seated to its appropriate depth with the final anteversion of probably  20-25 degrees.   At this point I was able to partially mobilize this medial fragment  indicating potentially a little bit more of a subacute versus chronic  injury as opposed to acute.  I passed a single wire around initially and  obtained a radiograph of the hip reduced.    Initially, followed my radiograph to at least evaluate that things were  looking adequately positioned, we dislocated again, carefully tried to  impact the femoral component to the femur probably 5 or 6 more  millimeters.  Once this was done the hip was re-reduced.  Another  radiograph was obtained confirming adequate reduction and then no  fissuring of bone distally.   Utilizing a bone hook, I did place 3 three proximal wires, 1 proximal  to, 1 distal to the lesser trochanter and the 1 that was already  previously placed.  These were all tensioned down appropriately, cut and  then bent.   Following the final radiographs and the impaction of the final 36 1.5  ball in the hip, the hip was reduced and irrigated.  I was able to  reapproximate the pseudocapsule to itself using #1 Vicryl.  A medium  Hemovac drain was placed deep and the remaining wound was closed with #1  Vicryl in the iliotibial band and gluteal fascia, 2-0 Vicryl was used in  the subcu layer and staples on the skin.  Skin was cleaned, dried and  dressed sterilely with Mepilex dressing.  She was brought to recovery  room in stable condition tolerating the procedure well.      Madlyn Frankel Charlann Boxer, M.D.  Electronically Signed     MDO/MEDQ  D:  08/26/2008  T:  08/27/2008  Job:  [id number]

## 2010-10-20 ENCOUNTER — Telehealth: Payer: Self-pay | Admitting: Cardiovascular Disease

## 2010-10-20 NOTE — Telephone Encounter (Signed)
All Cardiac faxed to [facility] @ [phone]  10/20/10/km

## 2010-10-21 ENCOUNTER — Other Ambulatory Visit: Payer: Self-pay | Admitting: Orthopedic Surgery

## 2010-10-21 ENCOUNTER — Encounter (HOSPITAL_COMMUNITY): Payer: Medicare Other

## 2010-10-21 LAB — URINALYSIS, ROUTINE W REFLEX MICROSCOPIC
Bilirubin Urine: NEGATIVE
Glucose, UA: NEGATIVE mg/dL
Ketones, ur: NEGATIVE mg/dL
pH: 5.5 (ref 5.0–8.0)

## 2010-10-21 LAB — CBC
HCT: 42.4 % (ref 36.0–46.0)
MCHC: 30.9 g/dL (ref 30.0–36.0)
MCV: 65.2 fL — ABNORMAL LOW (ref 78.0–100.0)
Platelets: 217 10*3/uL (ref 150–400)
RDW: 15.8 % — ABNORMAL HIGH (ref 11.5–15.5)

## 2010-10-21 LAB — DIFFERENTIAL
Eosinophils Relative: 5 % (ref 0–5)
Lymphocytes Relative: 29 % (ref 12–46)
Monocytes Absolute: 0.5 10*3/uL (ref 0.1–1.0)
Monocytes Relative: 5 % (ref 3–12)
Neutrophils Relative %: 60 % (ref 43–77)

## 2010-10-21 LAB — SURGICAL PCR SCREEN
MRSA, PCR: NEGATIVE
Staphylococcus aureus: NEGATIVE

## 2010-10-21 LAB — PROTIME-INR: Prothrombin Time: 14 seconds (ref 11.6–15.2)

## 2010-10-21 LAB — BASIC METABOLIC PANEL
BUN: 11 mg/dL (ref 6–23)
Creatinine, Ser: 0.65 mg/dL (ref 0.4–1.2)
GFR calc Af Amer: >60 mL/min (ref >60)
GFR calc non Af Amer: >60 mL/min (ref >60)

## 2010-10-21 LAB — URINE MICROSCOPIC-ADD ON

## 2010-10-26 ENCOUNTER — Inpatient Hospital Stay (HOSPITAL_COMMUNITY)
Admission: RE | Admit: 2010-10-26 | Discharge: 2010-10-29 | DRG: 470 | Disposition: A | Discharge status: Home Health | Payer: Medicare Other | Source: Ambulatory Visit | Attending: Orthopedic Surgery | Admitting: Orthopedic Surgery

## 2010-10-26 DIAGNOSIS — E119 Type 2 diabetes mellitus without complications: Secondary | ICD-10-CM | POA: Diagnosis present

## 2010-10-26 DIAGNOSIS — K219 Gastro-esophageal reflux disease without esophagitis: Secondary | ICD-10-CM | POA: Diagnosis present

## 2010-10-26 DIAGNOSIS — M1710 Unilateral primary osteoarthritis, unspecified knee: Principal | ICD-10-CM | POA: Diagnosis present

## 2010-10-26 DIAGNOSIS — I10 Essential (primary) hypertension: Secondary | ICD-10-CM | POA: Diagnosis present

## 2010-10-26 DIAGNOSIS — Z96659 Presence of unspecified artificial knee joint: Secondary | ICD-10-CM

## 2010-10-26 DIAGNOSIS — J4489 Other specified chronic obstructive pulmonary disease: Secondary | ICD-10-CM | POA: Diagnosis present

## 2010-10-26 DIAGNOSIS — M069 Rheumatoid arthritis, unspecified: Secondary | ICD-10-CM | POA: Diagnosis present

## 2010-10-26 DIAGNOSIS — J449 Chronic obstructive pulmonary disease, unspecified: Secondary | ICD-10-CM | POA: Diagnosis present

## 2010-10-26 DIAGNOSIS — Z01812 Encounter for preprocedural laboratory examination: Secondary | ICD-10-CM

## 2010-10-26 DIAGNOSIS — Z96649 Presence of unspecified artificial hip joint: Secondary | ICD-10-CM

## 2010-10-26 DIAGNOSIS — F341 Dysthymic disorder: Secondary | ICD-10-CM | POA: Diagnosis present

## 2010-10-26 LAB — TYPE AND SCREEN
ABO/RH(D): A POS
Antibody Screen: NEGATIVE

## 2010-10-26 LAB — GLUCOSE, CAPILLARY: Glucose-Capillary: 158 mg/dL — ABNORMAL HIGH (ref 70–99)

## 2010-10-26 NOTE — H&P (Signed)
NAME:  Anne Bryant, PIETSCH NO.:  [id number]  MEDICAL RECORD NO.:  [id number]  LOCATION:                                 FACILITY:  PHYSICIAN:  Madlyn Frankel. Charlann Boxer, M.D.  DATE OF BIRTH:  Oct 25, 1945  DATE OF ADMISSION:  10/26/2010 DATE OF DISCHARGE:                             HISTORY & PHYSICAL   ADMISSION DIAGNOSIS:  Osteoarthritis, right knee.  HISTORY OF PRESENT ILLNESS:  This is a 65 year old lady with a history of osteoarthritis of her right knee with failure of conserve treatment to alleviate her pain.  After discussion of treatment, benefits, risks, and options, the patient is now scheduled for total knee arthroplasty of the right knee.  Note that she is candidate for Tranexamic acid and will receive that at surgery.  She will be going home after surgery.  She is given her home medications of aspirin, Robaxin, iron, MiraLax, and Colace.  Note, her medical doctor is Dr. Ria Clock and cardiologist, Dr. Binnie Kand.  PAST MEDICAL HISTORY:  Drug allergies none.  CURRENT MEDICATIONS: 1. Omeprazole 20 mg b.i.d. 2. Prednisone 7.5 mg daily. 3. Ramipril 5 mg daily. 4. Amlodipine she is not taking currently. 5. Vicodin 10/325 one q.8 h. 6. Xanax 1 mg q.8 h. p.r.n. 7. Zyrtec one daily. 8. Cymbalta 120 mg daily. 9. Wellbutrin 300 mg daily. 10.Gabapentin 300 mg at bedtime. 11.Albuterol, Advair, QVAR, and glipizide; she does not have the     dosages with her.  MEDICAL ILLNESSES: 1. Barrett's esophagitis. 2. Rheumatoid arthritis. 3. Osteoarthritis. 4. Hypertension. 5. Anxiety. 6. Depression. 7. Chronic pain. 8. Diabetes.  PREVIOUS SURGERIES:  Cholecystectomy, hernia repair, rotator cuff repair, knee replacement, and hip replacement surgery.  FAMILY HISTORY:  Positive for heart disease and cirrhosis.  SOCIAL HISTORY:  The patient is married.  She does not smoke and does not drink.  REVIEW OF SYSTEMS:  CENTRAL NERVOUS SYSTEM:  Positive for  anxiety, fatigue, and depression.  PULMONARY:  Positive for exertional shortness of breath, also sleep apnea with CPAP.  CARDIOVASCULAR:  Negative for chest pain or palpitation.  GI:  Positive for Barrett's esophagitis and reflux.  GU:  Positive for nocturia.  MUSCULOSKELETAL:  As in HPI.  PHYSICAL EXAMINATION:  VITAL SIGNS:  BP 132/84, respirations 16, pulse 76 and regular. GENERAL APPEARANCE:  This is a well-developed and well-nourished lady in no acute distress. HEENT:  Head normocephalic.  Nose patent.  Ears patent.  Pupils equal, round, and reactive to light.  Throat without injection. NECK:  Supple without adenopathy.  Carotids are 2+ without bruits. CHEST:  Clear to auscultation.  No rales or rhonchi.  Respirations 16. HEART:  Regular rate and rhythm with 76 beats per minute without murmur. ABDOMEN:  Soft.  Active bowel sounds.  No masses or organomegaly. NEUROLOGIC:  The patient is alert and oriented to time, place, and person.  Cranial nerves II through XII grossly intact. EXTREMITIES:  Right knee with 0 to 120 degrees range of motion, pain with range of motion.  Neurovascular status intact.  IMPRESSION:  Right knee osteoarthritis.  PLAN:  Right total knee arthroplasty.     Jaquelyn Bitter. Chabon, P.A.   ______________________________ Madlyn Frankel Charlann Boxer, M.D.    SJC/MEDQ  D:  10/20/2010  T:  10/21/2010  Job:  [id number]  Electronically Signed by Jodene Nam P.A. on 10/25/2010 02:57:06 PM Electronically Signed by Durene Romans M.D. on 10/26/2010 09:14:49 AM

## 2010-10-26 NOTE — Op Note (Signed)
NAMEMarland Kitchen  Anne, Bryant NO.:  [id number]  MEDICAL RECORD NO.:  [id number]  LOCATION:  0002                         FACILITY:  [facility]  PHYSICIAN:  Madlyn Frankel. Charlann Boxer, M.D.  DATE OF BIRTH:  05-Dec-1945  DATE OF PROCEDURE:  10/26/2010 DATE OF DISCHARGE:                              OPERATIVE REPORT   PREOPERATIVE DIAGNOSIS:  Right knee osteoarthritis.  POSTOPERATIVE DIAGNOSIS:  Right knee osteoarthritis.  PROCEDURE:  Right total knee replacement.  COMPONENTS USED:  DePuy rotating platform, posterior stabilized knee system with size 2.5 femur, 2 tibia, 12.5-mm insert to match the 2.5 femur and a 38 patellar button.  SURGEON:  Madlyn Frankel. Charlann Boxer, M.D.  ASSISTANT:  Leilani Able, PA-C  ANESTHESIA:  Spinal.  SPECIMENS:  None.  COMPLICATIONS:  None.  DRAINS:  One Hemovac.  TOURNIQUET TIME:  35 minutes at 250 mmHg.  ESTIMATED BLOOD LOSS:  About 100 to 150 cc.  Please note the patient did receive IV Tylenol and as well tranexamic acid based on her weight.  INDICATIONS FOR PROCEDURE:  Mr. Anne Bryant is a 65 year old patient of mine from previous revision surgery on her left knee.  She had been followed in the office for increasing discomfort and her right knee pain, failed conservative measures and at this point ready to pursue with definitive measures, though bit anxious about the overall process, she was at this point ready to proceed.  Risks and benefits reviewed including infection, DVT, component failure, dislocation, need for revision surgery.  Consent was obtained for the benefit of pain relief.  After this was reviewed, consent was obtained.  PROCEDURE IN DETAIL:  The patient was brought to the operative room theater.  Once adequate anesthesia, preoperative antibiotics, Ancef administered, she was positioned supine with the right thigh tourniquet placed.  The right lower extremity was then prepped and draped in sterile fashion.  Time-out was performed.   The right lower extremity was placed in [facility] leg holder.  Leg was exsanguinated, tourniquet elevated to 250 mmHg.  Following this, a midline incision was made.  Median parapatellar arthrotomy was made.  Following initial exposure, attention was directed to patella, precut measurement was on about 20 mm.  I resected down to about 12 mm and restored patellar height using a 38 patellar button. Lug holes were drilled and a metal shim placed to protect the patella from retractors and saw blades.  At this point, attention was directed to femur.  The femoral canal was opened with a drill, irrigated to try to prevent fat emboli.  An intramedullary rod was passed and at 3 degrees of valgus 10 mm of bone was resected off the distal femur.  Following this resection, attention was directed to tibia.  Using extramedullary guide, a measured resection was taken off the proximal lateral tibia based on the overall anatomy appearance.  Following this cut, I confirmed the gap was stable medially and laterally with 10 mm insert.  We also confirmed the cut was perpendicular in coronal plane as the rotation of the femoral component was going to be based off this.  At this point, I sized the femur to be size 2.5 from an anterior- posterior dimension.  The size 2.5 rotation block was then  pinned into position, anterior referenced utilizing the C clamp to set rotation. The 4-in-1 cutting block was positioned.  The anterior-posterior chamfer cuts were then made without difficulty.  At this point, a box cut was made off the lateral aspect of distal femur.  Following this resection, tibia was subluxated anteriorly.  The cut surface of tibia seem to be best fit with a size 2 tibial tray and it was pinned into position, drilled and keel punched.  A trial reduction now carried out with 2.5 femur, 2 tibia and 10 mm insert though I felt the knee was stable from extension to flexion and I consideration to the  12.5 insert for final implant.  At this point, the trial components were removed.  The final components were opened holding the polyethylene insert until final trial.  The cement was mixed.  The knee was irrigated with normal saline solution, pulse lavage and injected with 0.25% Marcaine with epinephrine and 1 cc of Toradol.  The final components were cemented on to clean and dried cut surfaces of bone.  The knee was brought to extension with the 12.5 insert and extruded cement was removed.  Once the cement had fully cured, we assessed the range of motion, found the knee came to full extension and was stable from extension to flexion.  Given all these findings, I chose the final 12.5 insert.  Once all cement had been removed from the knee, the final 12.5 insert was then placed.  The knee was reirrigated with normal saline solution.  Tourniquet had been let down.  A medium Hemovac drain was placed deep.  Extensor mechanism was then reapproximated using #1 Vicryl with the knee in flexion.  The remainder of the wound was closed with 4-0 running Monocryl.  The wound was cleaned, dried, and dressed sterilely using Dermabond and Aquacel dressing, drain site dressed separately.  She was then brought to recovery room in stable condition tolerating the procedure well.     Madlyn Frankel Charlann Boxer, M.D.     MDO/MEDQ  D:  10/26/2010  T:  10/26/2010  Job:  [id number]  Electronically Signed by Durene Romans M.D. on 10/26/2010 12:36:11 PM

## 2010-10-27 LAB — BASIC METABOLIC PANEL
CO2: 31 mEq/L (ref 19–32)
Chloride: 104 mEq/L (ref 96–112)
Creatinine, Ser: 0.54 mg/dL (ref 0.50–1.10)
Glucose, Bld: 84 mg/dL (ref 70–99)
Sodium: 140 mEq/L (ref 135–145)

## 2010-10-27 LAB — CBC
Hemoglobin: 9.6 g/dL — ABNORMAL LOW (ref 12.0–15.0)
MCH: 20.4 pg — ABNORMAL LOW (ref 26.0–34.0)
MCV: 66 fL — ABNORMAL LOW (ref 78.0–100.0)
Platelets: 167 10*3/uL (ref 150–400)
RBC: 4.71 MIL/uL (ref 3.87–5.11)
WBC: 8.5 10*3/uL (ref 4.0–10.5)

## 2010-10-27 LAB — GLUCOSE, CAPILLARY
Glucose-Capillary: 87 mg/dL (ref 70–99)
Glucose-Capillary: 126 mg/dL — ABNORMAL HIGH (ref 70–99)
Glucose-Capillary: 141 mg/dL — ABNORMAL HIGH (ref 70–99)
Glucose-Capillary: 152 mg/dL — ABNORMAL HIGH (ref 70–99)

## 2010-10-28 LAB — GLUCOSE, CAPILLARY: Glucose-Capillary: 146 mg/dL — ABNORMAL HIGH (ref 70–99)

## 2010-10-28 LAB — CBC
MCH: 20.3 pg — ABNORMAL LOW (ref 26.0–34.0)
MCHC: 31.3 g/dL (ref 30.0–36.0)
Platelets: 190 10*3/uL (ref 150–400)

## 2010-10-28 LAB — BASIC METABOLIC PANEL
Calcium: 8.9 mg/dL (ref 8.4–10.5)
Potassium: 3.8 mEq/L (ref 3.5–5.1)
Sodium: 135 mEq/L (ref 135–145)

## 2010-11-01 LAB — GLUCOSE, CAPILLARY: Glucose-Capillary: 122 mg/dL — ABNORMAL HIGH (ref 70–99)

## 2010-11-08 NOTE — Discharge Summary (Signed)
NAMEMarland Kitchen  Anne Bryant, Anne Bryant NO.:  [id number]  MEDICAL RECORD NO.:  [id number]  LOCATION:  1620                         FACILITY:  [facility]  PHYSICIAN:  Madlyn Frankel. Charlann Boxer, M.D.  DATE OF BIRTH:  05/31/1945  DATE OF ADMISSION:  10/26/2010 DATE OF DISCHARGE:  10/29/2010                              DISCHARGE SUMMARY   ADMITTING DIAGNOSIS:  Right knee osteoarthritis.  DISCHARGE DIAGNOSES: 1. Right knee osteoarthritis, status post right total knee replacement     on October 26, 2010. 2. History of left total knee replacement. 3. Reflux disease. 4. Chronic obstructive pulmonary disease. 5. Barrett esophagitis. 6. Rheumatoid arthritis. 7. Hypertension. 8. Anxiety/depression. 9. Chronic pain. 10.Diabetes.  ADMITTING HISTORY:  [name] is a 65 year old patient of mine with previous history of left total knee.  She had presented to the office for right knee osteoarthritis, failing conservative measures including injections.  The risks and benefits of knee replacement surgery were discussed and reviewed.  Consent was obtained for the benefit of pain relief.  HOSPITAL COURSE:  The patient was admitted for same-day surgery on October 26, 2010.  Please see dictated operative note for full details of the procedure.  After routine procedure, she was transferred to the recovery room and then to the orthopedic ward where she remained for her hospital stay.  On postop day #1, her Foley catheter and Hemovac drain were removed.  She was seen and evaluated by Physical Therapy.  She was initially placed on IV Tylenol and oxycodone, but was fairly somnolent with this and so we switched her over on day #2 to Norco.  She seemed to tolerate this a little bit better, and by day #3 was ready for discharge.  She was kept in the hospital from day #2 to #3 to work on physical therapy as her overall activity level was diminished due to her pain medicine level.  She had no other complicating  events, did not require transfusion.  On postoperative day #1, she was noted to have a hematocrit of 31.1; on day #2, it was 33.5.  Her electrolytes remained stable.  There were no complicating features.  On postop day #3, after doing physical therapy, she was at this point ready to be discharged home and home health physical therapy arrangements were made.  DISCHARGE CONDITION:  Stable at the time of discharge.  DISCHARGE INSTRUCTIONS:  She will return to see Dr. Durene Romans at [facility] at [phone] in 2 weeks' time.  Any orthopedic questions can be addressed to our office.  DISCHARGE MEDICATIONS: 1. Colace 100 mg p.o. b.i.d. for constipation as needed while on pain     medicine. 2. MiraLax 17 g p.o. daily as needed for constipation while on pain     medicine. 3. Aspirin 325 mg p.o. b.i.d. for 30 days and then off. 4. She will be on Norco 5/325 one to two tablets, she can go by with     10 mg tablets to go up to 15 mg as needed for pain. 5. Robaxin 500 mg p.o. q.6 h. as needed for muscle spasm and pain.  She is also to take her home medicines of, 1. Omeprazole 20 mg p.o. b.i.d. 2. Prednisone 7.5  mg daily. 3. Ramipril 5 mg daily. 4. Xanax 1 mg q.8 h. as needed 5. Zyrtec daily. 6. Cymbalta 120 mg daily. 7. Wellbutrin 300 mg daily. 8. Gabapentin 300 mg nightly. 9. Albuterol. 10.Advair. 11.Glipizide.  Questions were encouraged, answers reviewed.     Madlyn Frankel Charlann Boxer, M.D.     MDO/MEDQ  D:  10/29/2010  T:  10/30/2010  Job:  [id number]  Electronically Signed by Durene Romans M.D. on 11/08/2010 09:27:42 AM

## 2013-02-13 ENCOUNTER — Ambulatory Visit (INDEPENDENT_AMBULATORY_CARE_PROVIDER_SITE_OTHER): Payer: No Typology Code available for payment source | Admitting: Psychiatry

## 2013-02-13 ENCOUNTER — Encounter (HOSPITAL_COMMUNITY): Payer: Self-pay | Admitting: Psychiatry

## 2013-02-13 ENCOUNTER — Encounter (INDEPENDENT_AMBULATORY_CARE_PROVIDER_SITE_OTHER): Payer: Self-pay

## 2013-02-13 VITALS — BP 128/88 | HR 84 | Ht 60.0 in | Wt 174.0 lb

## 2013-02-13 DIAGNOSIS — F332 Major depressive disorder, recurrent severe without psychotic features: Secondary | ICD-10-CM

## 2013-02-13 DIAGNOSIS — F411 Generalized anxiety disorder: Secondary | ICD-10-CM

## 2013-02-13 LAB — DRUGS OF ABUSE SCREEN W/O ALC, ROUTINE URINE

## 2013-02-13 MED ORDER — ALPRAZOLAM 1 MG PO TABS: 1.0000 mg | ORAL_TABLET | Freq: Three times a day (TID) | ORAL | Status: DC | PRN

## 2013-02-13 MED ORDER — DULOXETINE HCL 60 MG PO CPEP: 60.0000 mg | ORAL_CAPSULE | Freq: Every day | ORAL | Status: DC

## 2013-02-13 MED ORDER — FLUOXETINE HCL 20 MG PO CAPS: 20.0000 mg | ORAL_CAPSULE | Freq: Every day | ORAL | Status: DC
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
## 2013-02-15 DIAGNOSIS — F332 Major depressive disorder, recurrent severe without psychotic features: Secondary | ICD-10-CM | POA: Insufficient documentation

## 2013-02-15 DIAGNOSIS — F411 Generalized anxiety disorder: Secondary | ICD-10-CM | POA: Insufficient documentation

## 2013-02-16 LAB — DRUGS OF ABUSE SCREEN W/O ALC, ROUTINE URINE
Barbiturate Quant, Ur: NEGATIVE
Benzodiazepines.: POSITIVE — AB
Cocaine Metabolites: NEGATIVE
Creatinine,U: 119.3 mg/dL
Phencyclidine (PCP): NEGATIVE

## 2013-02-18 ENCOUNTER — Telehealth (HOSPITAL_COMMUNITY): Payer: Self-pay

## 2013-02-18 NOTE — Telephone Encounter (Signed)
Multiple Attempts to call patient. No Answer. Left message that I sent her prescription to [facility] pharmacy.

## 2013-02-19 ENCOUNTER — Telehealth (HOSPITAL_COMMUNITY): Payer: Self-pay | Admitting: Psychiatry

## 2013-02-19 LAB — OPIATES/OPIOIDS (LC/MS-MS)
Heroin (6-AM), UR: NEGATIVE ng/mL
Hydrocodone: 1352 ng/mL
Oxycodone, ur: NEGATIVE ng/mL
Oxymorphone: NEGATIVE ng/mL

## 2013-02-19 LAB — BENZODIAZEPINES (GC/LC/MS), URINE
Alprazolam (GC/LC/MS), ur confirm: 91 ng/mL
Clonazepam metabolite (GC/LC/MS), ur confirm: NEGATIVE ng/mL
Diazepam (GC/LC/MS), ur confirm: NEGATIVE ng/mL
Flunitrazepam metabolite (GC/LC/MS), ur confirm: NEGATIVE ng/mL
Lorazepam (GC/LC/MS), ur confirm: NEGATIVE ng/mL
Temazepam (GC/LC/MS), ur confirm: NEGATIVE ng/mL

## 2013-02-19 NOTE — Telephone Encounter (Signed)
Called pharmacy. Confirmed that pharmacy is able to receive prescription.

## 2013-02-19 NOTE — Telephone Encounter (Signed)
Called patient. She acknowledged receiving prescription for Xanax and will have it filled at [facility].

## 2013-03-11 ENCOUNTER — Ambulatory Visit (INDEPENDENT_AMBULATORY_CARE_PROVIDER_SITE_OTHER): Payer: No Typology Code available for payment source | Admitting: Psychiatry

## 2013-03-11 ENCOUNTER — Encounter (HOSPITAL_COMMUNITY): Payer: Self-pay | Admitting: Psychiatry

## 2013-03-11 VITALS — Ht 60.0 in | Wt 182.5 lb

## 2013-03-11 DIAGNOSIS — F332 Major depressive disorder, recurrent severe without psychotic features: Secondary | ICD-10-CM

## 2013-03-11 DIAGNOSIS — F411 Generalized anxiety disorder: Secondary | ICD-10-CM

## 2013-03-11 MED ORDER — ALPRAZOLAM 1 MG PO TABS: ORAL_TABLET | ORAL | Status: DC

## 2013-03-11 MED ORDER — VENLAFAXINE HCL ER 37.5 MG PO CP24: 37.5000 mg | ORAL_CAPSULE | Freq: Every day | ORAL | Status: DC

## 2013-03-11 MED ORDER — ALPRAZOLAM 1 MG PO TABS: 1.0000 mg | ORAL_TABLET | Freq: Three times a day (TID) | ORAL | Status: DC | PRN

## 2013-03-11 MED ORDER — BUPROPION HCL ER (XL) 150 MG PO TB24: 300.0000 mg | ORAL_TABLET | Freq: Every day | ORAL | Status: DC
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
## 2013-04-16 ENCOUNTER — Ambulatory Visit (HOSPITAL_COMMUNITY): Payer: Self-pay | Admitting: Psychiatry

## 2013-05-14 ENCOUNTER — Ambulatory Visit (INDEPENDENT_AMBULATORY_CARE_PROVIDER_SITE_OTHER): Payer: Medicare Other | Admitting: Cardiovascular Disease

## 2013-05-14 ENCOUNTER — Encounter: Payer: Self-pay | Admitting: Cardiovascular Disease

## 2013-05-14 VITALS — BP 135/83 | HR 76 | Ht 61.0 in | Wt 182.8 lb

## 2013-05-14 DIAGNOSIS — R0609 Other forms of dyspnea: Secondary | ICD-10-CM

## 2013-05-14 DIAGNOSIS — R0600 Dyspnea, unspecified: Secondary | ICD-10-CM

## 2013-05-14 DIAGNOSIS — R079 Chest pain, unspecified: Secondary | ICD-10-CM

## 2013-05-14 DIAGNOSIS — R0989 Other specified symptoms and signs involving the circulatory and respiratory systems: Secondary | ICD-10-CM

## 2013-05-14 NOTE — Progress Notes (Signed)
Primary care physician: Dr. Dalbert Mayotte  HPI  This is a 68 yo WF with multiple medical problems including borderline sleep apnea, DM, HTN, hyperlipidemia, obesity, sedentary lifestyle here today for evaluation of exertional dyspnea. She was seen by Dr. Sanjuana Kava in February 2011 for cardiac assessment prior to planned right knee replacement.  Stress test showed no evidence of ischemia. She was admitted to [facility] on 05/20/10 with chest pain. Cardiac enzymes were negative. Cardiac cath on 05/21/10 with no evidence of CAD. Her LV function was normal.  Over the last few weeks, she has been having dyspnea with minimal activities. No orthopnea or PND. Occasional chest pain but not exertional. NO recent viral illness. Not aware of lung issues.    Allergies  Allergen Reactions  . Azithromycin     REACTION: makes pt feel funny  . Escitalopram Oxalate   . Pregabalin     REACTION: dizzy, weakness  . Sertraline Hcl      Current Outpatient Prescriptions on File Prior to Visit  Medication Sig Dispense Refill  . ALPRAZolam (XANAX) 1 MG tablet Take one-half tablet in the morning, one tablet in the afternoon, and one tablet at bedtime as needed for sleep.  75 tablet  1  . buPROPion (WELLBUTRIN XL) 150 MG 24 hr tablet Take 2 tablets (300 mg total) by mouth daily.  30 tablet  1  . dicyclomine (BENTYL) 10 MG capsule Take 10 mg by mouth 4 (four) times daily as needed.      . furosemide (LASIX) 20 MG tablet Take 20 mg by mouth as needed.       Marland Kitchen HYDROcodone-acetaminophen (NORCO) 10-325 MG per tablet Take 1 tablet by mouth 3 (three) times daily as needed. Pain      . metoprolol tartrate (LOPRESSOR) 25 MG tablet Take 25 mg by mouth 2 (two) times daily.      Marland Kitchen omeprazole (PRILOSEC) 20 MG capsule Take 20 mg by mouth daily.      . predniSONE (DELTASONE) 5 MG tablet Take 7.5 mg by mouth daily.      . promethazine (PHENERGAN) 12.5 MG tablet Take 12.5 mg by mouth as needed.      . topiramate  (TOPAMAX) 25 MG tablet Take 50 mg by mouth 2 (two) times daily. 75 MG in the AM and 100 mg in the PM      . venlafaxine XR (EFFEXOR XR) 37.5 MG 24 hr capsule Take 1 capsule (37.5 mg total) by mouth daily.  30 capsule  1  . VENTOLIN HFA 108 (90 BASE) MCG/ACT inhaler        No current facility-administered medications on file prior to visit.     Past Medical History  Diagnosis Date  . Hypertension   . GERD (gastroesophageal reflux disease)   . Diverticulitis   . Migraine   . Asthma      No past surgical history on file.   Family History  Problem Relation Age of Onset  . Depression Mother   . Anxiety disorder Mother   . Bipolar disorder Neg Hx   . Schizophrenia Neg Hx   . Alcohol abuse Neg Hx   . Drug abuse Neg Hx   . OCD Neg Hx      History   Social History  . Marital Status: Married    Spouse Name: N/A    Number of Children: N/A  . Years of Education: N/A   Occupational History  . Not on file.   Social  History Main Topics  . Smoking status: Former Games developer  . Smokeless tobacco: Not on file     Comment: irregular use for months.  . Alcohol Use: No     Comment: Patient stopped.  . Drug Use: No  . Sexual Activity: Not Currently    Partners: Male   Other Topics Concern  . Not on file   Social History Narrative  . No narrative on file     ROS A 10 point review of system was performed. It is negative other than that mentioned in the history of present illness.   PHYSICAL EXAM   BP 135/83  Pulse 76  Ht 5\' 1"  (1.549 m)  Wt 182 lb 12.8 oz (82.918 kg)  BMI 34.56 kg/m2 Constitutional: She is oriented to person, place, and time. She appears well-developed and well-nourished. No distress.  HENT: No nasal discharge.  Head: Normocephalic and atraumatic.  Eyes: Pupils are equal and round. No discharge.  Neck: Normal range of motion. Neck supple. No JVD present. No thyromegaly present.  Cardiovascular: Normal rate, regular rhythm, normal heart sounds. Exam  reveals no gallop and no friction rub. No murmur heard.  Pulmonary/Chest: Effort normal and breath sounds normal. No stridor. No respiratory distress. She has no wheezes. She has no rales. She exhibits no tenderness.  Abdominal: Soft. Bowel sounds are normal. She exhibits no distension. There is no tenderness. There is no rebound and no guarding.  Musculoskeletal: Normal range of motion. She exhibits no edema and no tenderness.  Neurological: She is alert and oriented to person, place, and time. Coordination normal.  Skin: Skin is warm and dry. No rash noted. She is not diaphoretic. No erythema. No pallor.  Psychiatric: She has a normal mood and affect. Her behavior is normal. Judgment and thought content normal.     EKG: NSR   ASSESSMENT AND PLAN

## 2013-05-14 NOTE — Patient Instructions (Signed)
Your physician has requested that you have an exercise tolerance test. For further information please visit [email or web address] Please also follow instruction sheet, as given.  Your physician has requested that you have an echocardiogram. Echocardiography is a painless test that uses sound waves to create images of your heart. It provides your doctor with information about the size and shape of your heart and how well your heart's chambers and valves are working. This procedure takes approximately one hour. There are no restrictions for this procedure.  Your physician recommends that you schedule a follow-up appointment as needed with Dr Kirke Corin.

## 2013-05-15 ENCOUNTER — Encounter: Payer: Self-pay | Admitting: Cardiovascular Disease

## 2013-05-15 NOTE — Assessment & Plan Note (Signed)
This has been significant over last few weeks. The chest pain is atypical. Cardiac exam is unremarkable. ECG within normal limits. Previous cath in 2012 showed no significant obstructive disease.  I recommend a treadmill stress test and an echocardiogram.  If cardiac work up is unremarkable and symptoms persist, then pulmonary evaluation will be needed.

## 2013-05-16 ENCOUNTER — Ambulatory Visit (HOSPITAL_COMMUNITY)
Admission: RE | Admit: 2013-05-16 | Discharge: 2013-05-16 | Disposition: A | Discharge status: Home / Self Care | Payer: Medicare Other | Source: Ambulatory Visit | Attending: Cardiovascular Disease | Admitting: Cardiovascular Disease

## 2013-05-16 DIAGNOSIS — R0600 Dyspnea, unspecified: Secondary | ICD-10-CM

## 2013-05-16 DIAGNOSIS — R0989 Other specified symptoms and signs involving the circulatory and respiratory systems: Secondary | ICD-10-CM | POA: Insufficient documentation

## 2013-05-16 DIAGNOSIS — R079 Chest pain, unspecified: Secondary | ICD-10-CM | POA: Insufficient documentation

## 2013-05-16 DIAGNOSIS — R0609 Other forms of dyspnea: Secondary | ICD-10-CM | POA: Insufficient documentation

## 2013-05-21 ENCOUNTER — Encounter (HOSPITAL_COMMUNITY): Payer: Self-pay | Admitting: Psychiatry

## 2013-05-21 ENCOUNTER — Ambulatory Visit (INDEPENDENT_AMBULATORY_CARE_PROVIDER_SITE_OTHER): Payer: Medicare Other | Admitting: Psychiatry

## 2013-05-21 VITALS — BP 141/111 | HR 85 | Wt 188.0 lb

## 2013-05-21 DIAGNOSIS — F332 Major depressive disorder, recurrent severe without psychotic features: Secondary | ICD-10-CM

## 2013-05-21 DIAGNOSIS — F411 Generalized anxiety disorder: Secondary | ICD-10-CM

## 2013-05-21 MED ORDER — ALPRAZOLAM 1 MG PO TABS: ORAL_TABLET | ORAL | Status: DC

## 2013-05-21 MED ORDER — VENLAFAXINE HCL ER 37.5 MG PO CP24: ORAL_CAPSULE | ORAL | Status: DC
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
## 2013-05-22 ENCOUNTER — Encounter (HOSPITAL_COMMUNITY): Payer: Self-pay | Admitting: Psychiatry

## 2013-05-28 ENCOUNTER — Encounter: Payer: Self-pay | Admitting: Cardiovascular Disease

## 2013-05-28 ENCOUNTER — Ambulatory Visit (HOSPITAL_COMMUNITY): Payer: Medicare Other | Attending: Cardiovascular Disease | Admitting: Radiology

## 2013-05-28 DIAGNOSIS — E785 Hyperlipidemia, unspecified: Secondary | ICD-10-CM | POA: Insufficient documentation

## 2013-05-28 DIAGNOSIS — R0602 Shortness of breath: Secondary | ICD-10-CM

## 2013-05-28 DIAGNOSIS — E119 Type 2 diabetes mellitus without complications: Secondary | ICD-10-CM | POA: Insufficient documentation

## 2013-05-28 DIAGNOSIS — R079 Chest pain, unspecified: Secondary | ICD-10-CM | POA: Insufficient documentation

## 2013-05-28 DIAGNOSIS — Z87891 Personal history of nicotine dependence: Secondary | ICD-10-CM | POA: Insufficient documentation

## 2013-05-28 DIAGNOSIS — R0600 Dyspnea, unspecified: Secondary | ICD-10-CM

## 2013-05-28 DIAGNOSIS — G4733 Obstructive sleep apnea (adult) (pediatric): Secondary | ICD-10-CM | POA: Insufficient documentation

## 2013-05-28 DIAGNOSIS — E669 Obesity, unspecified: Secondary | ICD-10-CM | POA: Insufficient documentation

## 2013-05-28 DIAGNOSIS — Z6835 Body mass index (BMI) 35.0-35.9, adult: Secondary | ICD-10-CM | POA: Insufficient documentation

## 2013-05-28 DIAGNOSIS — I10 Essential (primary) hypertension: Secondary | ICD-10-CM | POA: Insufficient documentation

## 2013-05-28 DIAGNOSIS — R609 Edema, unspecified: Secondary | ICD-10-CM | POA: Insufficient documentation

## 2013-05-28 DIAGNOSIS — R072 Precordial pain: Secondary | ICD-10-CM

## 2013-05-28 NOTE — Progress Notes (Signed)
Echocardiogram performed.  

## 2013-06-03 ENCOUNTER — Encounter: Payer: Self-pay | Admitting: *Deleted

## 2013-06-04 ENCOUNTER — Encounter: Payer: Self-pay | Admitting: Cardiovascular Disease

## 2013-06-04 ENCOUNTER — Ambulatory Visit (INDEPENDENT_AMBULATORY_CARE_PROVIDER_SITE_OTHER): Payer: Medicare Other | Admitting: Cardiovascular Disease

## 2013-06-04 VITALS — BP 148/81 | HR 68 | Ht 61.5 in | Wt 177.0 lb

## 2013-06-04 DIAGNOSIS — F332 Major depressive disorder, recurrent severe without psychotic features: Secondary | ICD-10-CM

## 2013-06-04 DIAGNOSIS — R0609 Other forms of dyspnea: Secondary | ICD-10-CM

## 2013-06-04 DIAGNOSIS — I10 Essential (primary) hypertension: Secondary | ICD-10-CM

## 2013-06-04 DIAGNOSIS — R0989 Other specified symptoms and signs involving the circulatory and respiratory systems: Secondary | ICD-10-CM

## 2013-06-04 MED ORDER — AMLODIPINE BESYLATE 2.5 MG PO TABS: 2.5000 mg | ORAL_TABLET | Freq: Every day | ORAL | Status: DC

## 2013-06-04 NOTE — Assessment & Plan Note (Signed)
I added amlodipine 2.5 mg once daily for better blood pressure control.

## 2013-06-04 NOTE — Assessment & Plan Note (Signed)
This has been significant over last few weeks. The chest pain is atypical. Cardiac exam is unremarkable. ECG within normal limits. Previous cath in 2012 showed no significant obstructive disease.   Both echocardiogram and treadmill stress test were unremarkable. However, she had severe hypertensive response to exercise with chest pain. I suspect that anxiety is playing a role. I also think there is evidence of physical deconditioning. I suggested starting an exercise program in controlling her blood pressure. Followup in 6 months.

## 2013-06-04 NOTE — Progress Notes (Signed)
Primary care physician: Dr. Dalbert Mayotte  HPI  This is a 68 yo WF with multiple medical problems including borderline sleep apnea, DM, HTN, hyperlipidemia, obesity, sedentary lifestyle who is here today for followup visit regarding  exertional dyspnea. She was seen by Dr. Sanjuana Kava in February 2011 for cardiac assessment prior to planned right knee replacement.  Stress test showed no evidence of ischemia. She was admitted to [facility] on 05/20/10 with chest pain. Cardiac enzymes were negative. Cardiac cath on 05/21/10 with no evidence of CAD. Her LV function was normal.  She was seen recently for dyspnea with minimal activities. No orthopnea or PND. Occasional chest pain but not exertional.  Baseline EKG was normal. I proceeded with cardiac evaluation. Echocardiogram showed normal LV systolic function with no significant valvular abnormalities. Treadmill stress test was negative for ischemia. However, she was hyprtensive at baseline with hypertensive response with exercise. Blood pressure went up to 220 systolic. She had chest discomfort but no significant EKG changes. She has been significant amount of stress lately due to problems at her house with her sons. She is seeing a psychiatrist. Blood pressure has been running high lately. She takes metoprolol only once daily. She saw pulmonary and was told that there was no significant abnormalities. CTA of the chest was suggested that the patient did not want to followup.  Allergies  Allergen Reactions  . Azithromycin     REACTION: makes pt feel funny  . Escitalopram Oxalate   . Pregabalin     REACTION: dizzy, weakness  . Sertraline Hcl      Current Outpatient Prescriptions on File Prior to Visit  Medication Sig Dispense Refill  . ALPRAZolam (XANAX) 1 MG tablet Take one-half tablet in the morning, one-half tablet in the afternoon, and one-half tablet in the evening.  45 tablet  0  . APAP-Isometheptene-Dichloral (MIDRIN PO) Take by  mouth as needed.       . dicyclomine (BENTYL) 10 MG capsule Take 10 mg by mouth 4 (four) times daily as needed.      . DULoxetine (CYMBALTA) 60 MG capsule Take 60 mg by mouth 2 (two) times daily.      . furosemide (LASIX) 20 MG tablet Take 20 mg by mouth as needed.       Marland Kitchen HYDROcodone-acetaminophen (NORCO) 10-325 MG per tablet Take 1 tablet by mouth 3 (three) times daily as needed. Pain      . metoprolol tartrate (LOPRESSOR) 25 MG tablet Take 25 mg by mouth daily.       Marland Kitchen omeprazole (PRILOSEC) 20 MG capsule Take 20 mg by mouth daily.      . predniSONE (DELTASONE) 5 MG tablet Take 7.5 mg by mouth daily.      . promethazine (PHENERGAN) 12.5 MG tablet Take 12.5 mg by mouth as needed.      . topiramate (TOPAMAX) 25 MG tablet Take 50 mg by mouth 2 (two) times daily. 75 MG in the AM and 100 mg in the PM      . venlafaxine XR (EFFEXOR XR) 37.5 MG 24 hr capsule Take 37.5 mg for 14 days, then increase to 75 mg daily.  60 capsule  1  . VENTOLIN HFA 108 (90 BASE) MCG/ACT inhaler every 6 (six) hours as needed.        No current facility-administered medications on file prior to visit.     Past Medical History  Diagnosis Date  . Hypertension   . GERD (gastroesophageal reflux disease)   .  Diverticulitis   . Migraine   . Asthma      No past surgical history on file.   Family History  Problem Relation Age of Onset  . Depression Mother   . Anxiety disorder Mother   . Bipolar disorder Neg Hx   . Schizophrenia Neg Hx   . Alcohol abuse Neg Hx   . Drug abuse Neg Hx   . OCD Neg Hx      History   Social History  . Marital Status: Married    Spouse Name: N/A    Number of Children: N/A  . Years of Education: N/A   Occupational History  . Not on file.   Social History Main Topics  . Smoking status: Former Smoker    Types: Cigarettes  . Smokeless tobacco: Not on file     Comment: irregular use for months.  . Alcohol Use: No     Comment: Patient stopped.  . Drug Use: No     Comment:  Caffeine: Coffee 1 cup per day. Caffeinated Beverages 32 ounces per day. 8 ounces of soda  . Sexual Activity: Not Currently    Partners: Male   Other Topics Concern  . Not on file   Social History Narrative  . No narrative on file     ROS A 10 point review of system was performed. It is negative other than that mentioned in the history of present illness.   PHYSICAL EXAM   BP 148/81  Pulse 68  Ht 5' 1.5" (1.562 m)  Wt 177 lb (80.287 kg)  BMI 32.91 kg/m2 Constitutional: She is oriented to person, place, and time. She appears well-developed and well-nourished. No distress.  HENT: No nasal discharge.  Head: Normocephalic and atraumatic.  Eyes: Pupils are equal and round. No discharge.  Neck: Normal range of motion. Neck supple. No JVD present. No thyromegaly present.  Cardiovascular: Normal rate, regular rhythm, normal heart sounds. Exam reveals no gallop and no friction rub. No murmur heard.  Pulmonary/Chest: Effort normal and breath sounds normal. No stridor. No respiratory distress. She has no wheezes. She has no rales. She exhibits no tenderness.  Abdominal: Soft. Bowel sounds are normal. She exhibits no distension. There is no tenderness. There is no rebound and no guarding.  Musculoskeletal: Normal range of motion. She exhibits no edema and no tenderness.  Neurological: She is alert and oriented to person, place, and time. Coordination normal.  Skin: Skin is warm and dry. No rash noted. She is not diaphoretic. No erythema. No pallor.  Psychiatric: She has a normal mood and affect. Her behavior is normal. Judgment and thought content normal.     EKG: NSR   ASSESSMENT AND PLAN

## 2013-06-04 NOTE — Assessment & Plan Note (Signed)
She is to continue followup with psychiatric. I do think that anxiety is playing a role in her symptoms as well.

## 2013-06-04 NOTE — Patient Instructions (Signed)
Your physician wants you to follow-up in:  6 months with Dr. Kirke Corin. You will receive a reminder letter in the mail two months in advance. If you don't receive a letter, please call our office to schedule the follow-up appointment.  Your physician has recommended you make the following change in your medication:  Start amlodipine 2.5 mg by mouth daily

## 2013-06-24 ENCOUNTER — Ambulatory Visit (HOSPITAL_COMMUNITY): Payer: Self-pay | Admitting: Psychiatry

## 2014-08-04 ENCOUNTER — Emergency Department
Admission: EM | Admit: 2014-08-04 | Discharge: 2014-08-04 | Disposition: A | Discharge status: Home / Self Care | Payer: Medicare Other | Source: Home / Self Care | Attending: Family Medicine | Admitting: Family Medicine

## 2014-08-04 ENCOUNTER — Encounter: Payer: Self-pay | Admitting: *Deleted

## 2014-08-04 DIAGNOSIS — R109 Unspecified abdominal pain: Secondary | ICD-10-CM | POA: Diagnosis not present

## 2014-08-04 DIAGNOSIS — B9789 Other viral agents as the cause of diseases classified elsewhere: Secondary | ICD-10-CM

## 2014-08-04 DIAGNOSIS — J9801 Acute bronchospasm: Secondary | ICD-10-CM | POA: Diagnosis not present

## 2014-08-04 DIAGNOSIS — J069 Acute upper respiratory infection, unspecified: Secondary | ICD-10-CM

## 2014-08-04 LAB — POCT URINALYSIS DIP (MANUAL ENTRY)
Bilirubin, UA: NEGATIVE
Blood, UA: NEGATIVE
GLUCOSE UA: NEGATIVE
Ketones, POC UA: NEGATIVE
NITRITE UA: NEGATIVE
Protein Ur, POC: NEGATIVE
Spec Grav, UA: 1.02 (ref 1.005–1.03)
UROBILINOGEN UA: 0.2 (ref 0–1)
pH, UA: 6.5 (ref 5–8)

## 2014-08-04 MED ORDER — CEFDINIR 300 MG PO CAPS: 300.0000 mg | ORAL_CAPSULE | Freq: Two times a day (BID) | ORAL | Status: DC

## 2014-08-04 MED ORDER — GUAIFENESIN-CODEINE 100-10 MG/5ML PO SOLN: ORAL | Status: DC

## 2014-08-04 MED ORDER — FLUTICASONE PROPIONATE HFA 44 MCG/ACT IN AERO: 2.0000 | INHALATION_SPRAY | Freq: Two times a day (BID) | RESPIRATORY_TRACT | Status: DC

## 2014-08-04 NOTE — ED Notes (Signed)
Pt c/o dry cough, chest tightness, sneezing, flank pain and polyuria x 3 days.

## 2014-08-04 NOTE — ED Provider Notes (Signed)
CSN: [id number]     Arrival date & time 08/04/14  1315 History   First MD Initiated Contact with Patient 08/04/14 1339     Chief Complaint  Patient presents with  . Cough  . Flank Pain  . Polyuria      HPI Comments: Patient presents with two complaints: 1)  Three days ago she developed a non-productive cough, headache, wheezing, and shortness of breath with activity.  She has started using her Ventolin inhaler. 2)  She has developed mild lower back ache and increased urinary frequency without dysuria.  The history is provided by the patient.    Past Medical History  Diagnosis Date  . Hypertension   . GERD (gastroesophageal reflux disease)   . Diverticulitis   . Migraine   . Asthma    History reviewed. No pertinent past surgical history. Family History  Problem Relation Age of Onset  . Depression Mother   . Anxiety disorder Mother   . Bipolar disorder Neg Hx   . Schizophrenia Neg Hx   . Alcohol abuse Neg Hx   . Drug abuse Neg Hx   . OCD Neg Hx    History  Substance Use Topics  . Smoking status: Former Smoker    Types: Cigarettes  . Smokeless tobacco: Not on file     Comment: irregular use for months.  . Alcohol Use: No     Comment: Patient stopped.   OB History    No data available     Review of Systems No sore throat + cough No pleuritic pain but has tightness in her anterior chest + wheezing + nasal congestion + post-nasal drainage No sinus pain/pressure No itchy/red eyes ? earache No hemoptysis + SOB with activity No fever, + chills No nausea No vomiting No abdominal pain No diarrhea No urinary symptoms No skin rash + fatigue No myalgias + headache Used OTC meds without relief  Allergies  Azithromycin; Escitalopram oxalate; Pregabalin; and Sertraline hcl  Home Medications   Prior to Admission medications   Medication Sig Start Date End Date Taking? Authorizing Provider  amLODipine (NORVASC) 2.5 MG tablet Take 1 tablet (2.5 mg total) by  mouth daily. 06/04/13  Yes Iran Ouch, MD  APAP-Isometheptene-Dichloral (MIDRIN PO) Take by mouth as needed.    Yes Historical Provider, MD  DULoxetine (CYMBALTA) 60 MG capsule Take 60 mg by mouth 2 (two) times daily. 02/13/13 08/04/14 Yes Larena Sox, MD  glipiZIDE (GLUCOTROL) 5 MG tablet Take by mouth daily before breakfast.   Yes Historical Provider, MD  HYDROcodone-acetaminophen (NORCO) 10-325 MG per tablet Take 1 tablet by mouth 3 (three) times daily as needed. Pain 01/17/13  Yes Historical Provider, MD  metoprolol tartrate (LOPRESSOR) 25 MG tablet Take 25 mg by mouth daily.  02/08/13  Yes Historical Provider, MD  omeprazole (PRILOSEC) 20 MG capsule Take 20 mg by mouth daily. 01/17/13  Yes Historical Provider, MD  predniSONE (DELTASONE) 5 MG tablet Take 7.5 mg by mouth daily. 01/24/13  Yes Historical Provider, MD  promethazine (PHENERGAN) 12.5 MG tablet Take 12.5 mg by mouth as needed. 01/17/13  Yes Historical Provider, MD  topiramate (TOPAMAX) 25 MG tablet Take 50 mg by mouth 2 (two) times daily. 75 MG in the AM and 100 mg in the PM 02/08/13  Yes Historical Provider, MD  VENTOLIN HFA 108 (90 BASE) MCG/ACT inhaler every 6 (six) hours as needed.  03/05/13  Yes Historical Provider, MD  ALPRAZolam Prudy Feeler) 1 MG tablet Take one-half tablet in  the morning, one-half tablet in the afternoon, and one-half tablet in the evening. 05/21/13   Larena Sox, MD  cefdinir (OMNICEF) 300 MG capsule Take 1 capsule (300 mg total) by mouth 2 (two) times daily. 08/04/14   Lattie Haw, MD  dicyclomine (BENTYL) 10 MG capsule Take 10 mg by mouth 4 (four) times daily as needed. 12/17/12   Historical Provider, MD  fluticasone (FLOVENT HFA) 44 MCG/ACT inhaler Inhale 2 puffs into the lungs 2 (two) times daily. 08/04/14   Lattie Haw, MD  furosemide (LASIX) 20 MG tablet Take 20 mg by mouth as needed.  01/17/13   Historical Provider, MD  guaiFENesin-codeine 100-10 MG/5ML syrup Take 72mL by mouth at bedtime as needed  for cough 08/04/14   Lattie Haw, MD  venlafaxine XR (EFFEXOR XR) 37.5 MG 24 hr capsule Take 37.5 mg for 14 days, then increase to 75 mg daily. 05/21/13   Larena Sox, MD   BP 162/102 mmHg  Pulse 73  Temp(Src) 98.1 F (36.7 C) (Oral)  Resp 16  Ht 5\' 1"  (1.549 m)  Wt 179 lb (81.194 kg)  BMI 33.84 kg/m2  SpO2 97% Physical Exam Nursing notes and Vital Signs reviewed. Appearance:  Patient appears stated age, and in no acute distress.  Patient is obese (BMI 33.8) Eyes:  Pupils are equal, round, and reactive to light and accomodation.  Extraocular movement is intact.  Conjunctivae are not inflamed  Ears:  Canals normal.  Tympanic membranes normal.  Nose:  Mildly congested turbinates.  No sinus tenderness.   Pharynx:  Normal Neck:  Supple.  Slightly tender shotty posterior nodes are palpated bilaterally  Lungs:  Clear to auscultation.  Breath sounds are equal.  Heart:  Regular rate and rhythm without murmurs, rubs, or gallops.  Abdomen:  Nontender without masses or hepatosplenomegaly.  Bowel sounds are present.  No CVA or flank tenderness.  Extremities:  No edema.  No calf tenderness Skin:  No rash present.   ED Course  Procedures  None    Labs Reviewed  URINE CULTURE  POCT URINALYSIS DIP (MANUAL ENTRY):  LEU trace, otherwise negative      MDM   1. Flank pain   2. Viral URI with cough   3. Bronchospasm, acute    Urine culture pending. Begin Omnicef 300mg  BID.  Add Flovent HFA.  Robitussin AC at bedtime. Take plain guaifenesin liquid (such as Robitussin) every 3 to 4 hours with plenty of water, for cough and congestion.   Get adequate rest.   May use Afrin nasal spray (or generic oxymetazoline) twice daily for about 5 days.  Also recommend using saline nasal spray several times daily and saline nasal irrigation (AYR is a common brand).   Try warm salt water gargles for sore throat.  Stop all antihistamines for now, and other non-prescription cough/cold  preparations. Continue albuterol inhaler as needed   Follow-up with family doctor if not improving about 7 to10 days.    , MD 08/09/14 [phone]

## 2014-08-04 NOTE — Discharge Instructions (Signed)
Take plain guaifenesin liquid (such as Robitussin) every 3 to 4 hours with plenty of water, for cough and congestion.   Get adequate rest.   May use Afrin nasal spray (or generic oxymetazoline) twice daily for about 5 days.  Also recommend using saline nasal spray several times daily and saline nasal irrigation (AYR is a common brand).   Try warm salt water gargles for sore throat.  Stop all antihistamines for now, and other non-prescription cough/cold preparations. Continue albuterol inhaler as needed   Follow-up with family doctor if not improving about 7 to10 days.

## 2014-08-06 LAB — URINE CULTURE: Colony Count: 3000

## 2016-06-19 ENCOUNTER — Other Ambulatory Visit: Payer: Self-pay

## 2016-06-19 ENCOUNTER — Emergency Department (HOSPITAL_COMMUNITY): Payer: Medicare Other

## 2016-06-19 ENCOUNTER — Encounter: Payer: Self-pay | Admitting: Emergency Medicine

## 2016-06-19 ENCOUNTER — Emergency Department
Admission: EM | Admit: 2016-06-19 | Discharge: 2016-06-19 | Disposition: A | Discharge status: Other Acute Inpatient Hospital | Payer: Medicare Other | Source: Home / Self Care | Attending: Emergency Medicine | Admitting: Emergency Medicine

## 2016-06-19 ENCOUNTER — Encounter (HOSPITAL_COMMUNITY): Payer: Self-pay | Admitting: Emergency Medicine

## 2016-06-19 ENCOUNTER — Observation Stay (HOSPITAL_COMMUNITY)
Admission: EM | Admit: 2016-06-19 | Discharge: 2016-06-20 | Disposition: A | Discharge status: Home / Self Care | Payer: Medicare Other | Attending: Internal Medicine | Admitting: Internal Medicine

## 2016-06-19 DIAGNOSIS — Z6835 Body mass index (BMI) 35.0-35.9, adult: Secondary | ICD-10-CM | POA: Diagnosis not present

## 2016-06-19 DIAGNOSIS — F411 Generalized anxiety disorder: Secondary | ICD-10-CM | POA: Insufficient documentation

## 2016-06-19 DIAGNOSIS — I10 Essential (primary) hypertension: Secondary | ICD-10-CM | POA: Diagnosis present

## 2016-06-19 DIAGNOSIS — E785 Hyperlipidemia, unspecified: Secondary | ICD-10-CM | POA: Diagnosis not present

## 2016-06-19 DIAGNOSIS — Z79891 Long term (current) use of opiate analgesic: Secondary | ICD-10-CM | POA: Insufficient documentation

## 2016-06-19 DIAGNOSIS — E119 Type 2 diabetes mellitus without complications: Secondary | ICD-10-CM | POA: Insufficient documentation

## 2016-06-19 DIAGNOSIS — M069 Rheumatoid arthritis, unspecified: Secondary | ICD-10-CM | POA: Diagnosis not present

## 2016-06-19 DIAGNOSIS — K449 Diaphragmatic hernia without obstruction or gangrene: Secondary | ICD-10-CM | POA: Diagnosis not present

## 2016-06-19 DIAGNOSIS — K219 Gastro-esophageal reflux disease without esophagitis: Secondary | ICD-10-CM | POA: Diagnosis not present

## 2016-06-19 DIAGNOSIS — R0789 Other chest pain: Secondary | ICD-10-CM | POA: Diagnosis not present

## 2016-06-19 DIAGNOSIS — F329 Major depressive disorder, single episode, unspecified: Secondary | ICD-10-CM | POA: Insufficient documentation

## 2016-06-19 DIAGNOSIS — Z79899 Other long term (current) drug therapy: Secondary | ICD-10-CM | POA: Diagnosis not present

## 2016-06-19 DIAGNOSIS — E669 Obesity, unspecified: Secondary | ICD-10-CM | POA: Diagnosis not present

## 2016-06-19 DIAGNOSIS — R079 Chest pain, unspecified: Secondary | ICD-10-CM | POA: Diagnosis present

## 2016-06-19 DIAGNOSIS — Z87891 Personal history of nicotine dependence: Secondary | ICD-10-CM | POA: Insufficient documentation

## 2016-06-19 DIAGNOSIS — M797 Fibromyalgia: Secondary | ICD-10-CM | POA: Diagnosis not present

## 2016-06-19 DIAGNOSIS — R072 Precordial pain: Secondary | ICD-10-CM | POA: Diagnosis not present

## 2016-06-19 DIAGNOSIS — G4733 Obstructive sleep apnea (adult) (pediatric): Secondary | ICD-10-CM | POA: Insufficient documentation

## 2016-06-19 DIAGNOSIS — J45909 Unspecified asthma, uncomplicated: Secondary | ICD-10-CM | POA: Insufficient documentation

## 2016-06-19 DIAGNOSIS — Z7952 Long term (current) use of systemic steroids: Secondary | ICD-10-CM | POA: Diagnosis not present

## 2016-06-19 DIAGNOSIS — E089 Diabetes mellitus due to underlying condition without complications: Secondary | ICD-10-CM | POA: Diagnosis present

## 2016-06-19 DIAGNOSIS — M1990 Unspecified osteoarthritis, unspecified site: Secondary | ICD-10-CM | POA: Diagnosis not present

## 2016-06-19 HISTORY — DX: Unspecified osteoarthritis, unspecified site: M19.90

## 2016-06-19 HISTORY — DX: Type 2 diabetes mellitus without complications: E11.9

## 2016-06-19 HISTORY — DX: Fibromyalgia: M79.7

## 2016-06-19 LAB — BASIC METABOLIC PANEL
ANION GAP: 10 (ref 5–15)
BUN: 5 mg/dL — ABNORMAL LOW (ref 6–20)
CALCIUM: 9.4 mg/dL (ref 8.9–10.3)
CO2: 27 mmol/L (ref 22–32)
CREATININE: 0.76 mg/dL (ref 0.44–1.00)
Chloride: 101 mmol/L (ref 101–111)
GFR calc Af Amer: >60 mL/min (ref >60)
GFR calc non Af Amer: >60 mL/min (ref >60)
GLUCOSE: 236 mg/dL — AB (ref 65–99)
Potassium: 3.7 mmol/L (ref 3.5–5.1)
Sodium: 138 mmol/L (ref 135–145)

## 2016-06-19 LAB — CBC
HEMATOCRIT: 39 % (ref 36.0–46.0)
HEMOGLOBIN: 12.2 g/dL (ref 12.0–15.0)
MCH: 20.7 pg — ABNORMAL LOW (ref 26.0–34.0)
MCHC: 31.3 g/dL (ref 30.0–36.0)
MCV: 66.1 fL — AB (ref 78.0–100.0)
PLATELETS: 198 10*3/uL (ref 150–400)
RBC: 5.9 MIL/uL — AB (ref 3.87–5.11)
RDW: 15.5 % (ref 11.5–15.5)
WBC: 9.4 10*3/uL (ref 4.0–10.5)

## 2016-06-19 LAB — CBC WITH DIFFERENTIAL/PLATELET
BASOS PCT: 0 %
Basophils Absolute: 0 10*3/uL (ref 0.0–0.1)
Eosinophils Absolute: 0.2 10*3/uL (ref 0.0–0.7)
Eosinophils Relative: 2 %
HCT: 40.7 % (ref 36.0–46.0)
HEMOGLOBIN: 12.9 g/dL (ref 12.0–15.0)
LYMPHS PCT: 27 %
Lymphs Abs: 2.1 10*3/uL (ref 0.7–4.0)
MCH: 20.8 pg — ABNORMAL LOW (ref 26.0–34.0)
MCHC: 31.7 g/dL (ref 30.0–36.0)
MCV: 65.6 fL — ABNORMAL LOW (ref 78.0–100.0)
Monocytes Absolute: 0.3 10*3/uL (ref 0.1–1.0)
Monocytes Relative: 4 %
Neutro Abs: 5.2 10*3/uL (ref 1.7–7.7)
Neutrophils Relative %: 67 %
Platelets: 175 10*3/uL (ref 150–400)
RBC: 6.2 MIL/uL — ABNORMAL HIGH (ref 3.87–5.11)
RDW: 15.7 % — ABNORMAL HIGH (ref 11.5–15.5)
WBC: 7.8 10*3/uL (ref 4.0–10.5)

## 2016-06-19 LAB — GLUCOSE, CAPILLARY: GLUCOSE-CAPILLARY: 251 mg/dL — AB (ref 65–99)

## 2016-06-19 LAB — CREATININE, SERUM
Creatinine, Ser: 0.9 mg/dL (ref 0.44–1.00)
GFR calc non Af Amer: >60 mL/min (ref >60)

## 2016-06-19 LAB — I-STAT TROPONIN, ED: TROPONIN I, POC: 0 ng/mL (ref 0.00–0.08)

## 2016-06-19 LAB — TROPONIN I: Troponin I: <0.03 ng/mL (ref <0.03)

## 2016-06-19 MED ORDER — NITROGLYCERIN 0.4 MG SL SUBL
0.4000 mg | SUBLINGUAL_TABLET | SUBLINGUAL | Status: DC | PRN
  Administered 2016-06-20: 0.4 mg via SUBLINGUAL
  Filled 2016-06-19: qty 1

## 2016-06-19 MED ORDER — ENOXAPARIN SODIUM 40 MG/0.4ML ~~LOC~~ SOLN
40.0000 mg | SUBCUTANEOUS | Status: DC
  Administered 2016-06-19: 40 mg via SUBCUTANEOUS
  Filled 2016-06-19: qty 0.4

## 2016-06-19 MED ORDER — ACETAMINOPHEN 650 MG RE SUPP: 650.0000 mg | Freq: Four times a day (QID) | RECTAL | Status: DC | PRN

## 2016-06-19 MED ORDER — ASPIRIN 81 MG PO CHEW
324.0000 mg | CHEWABLE_TABLET | Freq: Every day | ORAL | Status: DC
  Filled 2016-06-19: qty 4

## 2016-06-19 MED ORDER — VENLAFAXINE HCL ER 37.5 MG PO CP24: 37.5000 mg | ORAL_CAPSULE | Freq: Every day | ORAL | Status: DC

## 2016-06-19 MED ORDER — PANTOPRAZOLE SODIUM 40 MG PO TBEC
40.0000 mg | DELAYED_RELEASE_TABLET | Freq: Every day | ORAL | Status: DC
  Administered 2016-06-19 – 2016-06-20 (×2): 40 mg via ORAL
  Filled 2016-06-19 (×2): qty 1

## 2016-06-19 MED ORDER — TOPIRAMATE 25 MG PO TABS: 50.0000 mg | ORAL_TABLET | Freq: Two times a day (BID) | ORAL | Status: DC

## 2016-06-19 MED ORDER — ACETAMINOPHEN 325 MG PO TABS
650.0000 mg | ORAL_TABLET | Freq: Four times a day (QID) | ORAL | Status: DC | PRN
  Administered 2016-06-20: 650 mg via ORAL
  Filled 2016-06-19: qty 2

## 2016-06-19 MED ORDER — TOPIRAMATE 100 MG PO TABS
100.0000 mg | ORAL_TABLET | Freq: Every day | ORAL | Status: DC
  Administered 2016-06-19: 100 mg via ORAL
  Filled 2016-06-19: qty 1

## 2016-06-19 MED ORDER — ALPRAZOLAM 0.5 MG PO TABS: 0.5000 mg | ORAL_TABLET | Freq: Three times a day (TID) | ORAL | Status: DC | PRN

## 2016-06-19 MED ORDER — ONDANSETRON HCL 4 MG PO TABS: 4.0000 mg | ORAL_TABLET | Freq: Four times a day (QID) | ORAL | Status: DC | PRN

## 2016-06-19 MED ORDER — INSULIN ASPART 100 UNIT/ML ~~LOC~~ SOLN
0.0000 [IU] | Freq: Three times a day (TID) | SUBCUTANEOUS | Status: DC
  Administered 2016-06-20: 3 [IU] via SUBCUTANEOUS
  Administered 2016-06-20: 2 [IU] via SUBCUTANEOUS

## 2016-06-19 MED ORDER — MORPHINE SULFATE (PF) 4 MG/ML IV SOLN
4.0000 mg | Freq: Once | INTRAVENOUS | Status: AC
  Administered 2016-06-19: 4 mg via INTRAVENOUS
  Filled 2016-06-19: qty 1

## 2016-06-19 MED ORDER — AMLODIPINE BESYLATE 2.5 MG PO TABS
2.5000 mg | ORAL_TABLET | Freq: Every day | ORAL | Status: DC
  Administered 2016-06-19 – 2016-06-20 (×2): 2.5 mg via ORAL
  Filled 2016-06-19 (×2): qty 1

## 2016-06-19 MED ORDER — ONDANSETRON HCL 4 MG/2ML IJ SOLN: 4.0000 mg | Freq: Four times a day (QID) | INTRAMUSCULAR | Status: DC | PRN

## 2016-06-19 MED ORDER — IOPAMIDOL (ISOVUE-370) INJECTION 76%
INTRAVENOUS | Status: AC
Start: 2016-06-19 — End: 2016-06-19
  Administered 2016-06-19: 100 mL
  Filled 2016-06-19: qty 100

## 2016-06-19 MED ORDER — ASPIRIN 81 MG PO CHEW
324.0000 mg | CHEWABLE_TABLET | Freq: Once | ORAL | Status: AC
  Administered 2016-06-19: 324 mg via ORAL
  Filled 2016-06-19: qty 4

## 2016-06-19 MED ORDER — METOPROLOL SUCCINATE ER 25 MG PO TB24
25.0000 mg | ORAL_TABLET | Freq: Every day | ORAL | Status: DC
  Administered 2016-06-19 – 2016-06-20 (×2): 25 mg via ORAL
  Filled 2016-06-19 (×2): qty 1

## 2016-06-19 MED ORDER — METOPROLOL TARTRATE 25 MG PO TABS: 25.0000 mg | ORAL_TABLET | Freq: Every day | ORAL | Status: DC

## 2016-06-19 NOTE — ED Triage Notes (Signed)
Patient presents to [facility] with C/O pain in the center and left side CP rates pain 7/10, pain feels like a pressure and tightness which is intermittent since Friday in the middle of the night. Nausea without vomiting and patient states she has been sweaty at times.

## 2016-06-19 NOTE — ED Provider Notes (Signed)
[facility]-EMERGENCY DEPT Provider Note   CSN: [id number] Arrival date & time: 06/19/16  1425     History   Chief Complaint Chief Complaint  Patient presents with  . Chest Pain  . Cough    HPI Anne Bryant is a 71 y.o. female.  Patient is a 71 year old female with a history of diabetes, hypertension and hyperlipidemia who presents with chest pain. She states it started as an intense tightness in her chest 2 days ago in the morning. Progressively got worse. Has been associated with nausea. She states she's had a constant tightness since that time but intermittently she has a sharper pain. She states the pain is the left side of her chest that goes through to her back between her shoulder blades. At times it radiates up toward her neck. She denies any facial numbness. No vision changes. No numbness or weakness to her extremities. She does have some associated shortness of breath. The symptoms seem to be worse with exertion. She denies any leg pain or swelling. No coughing or chest congestion. No prior history of coronary artery disease. She states she did have a cardiac cath about 7 years ago which she was told was normal. Per nursing, she is noted to have a 60 point discrepancy in systolic blood pressure between arms.      Past Medical History:  Diagnosis Date  . Arthritis   . Asthma   . Diabetes mellitus without complication (HCC)   . Diverticulitis   . GERD (gastroesophageal reflux disease)   . Hypertension   . Migraine     Patient Active Problem List   Diagnosis Date Noted  . Major depressive disorder, recurrent episode, severe, without mention of psychotic behavior 02/15/2013  . GAD (generalized anxiety disorder) 02/15/2013  . DYSPNEA 06/23/2009  . CHEST PAIN-PRECORDIAL 06/23/2009  . DM 06/19/2009  . HYPERLIPIDEMIA 06/19/2009  . LEUKOCYTOSIS 06/19/2009  . ANXIETY DISORDER 06/19/2009  . PANIC ATTACK 06/19/2009  . DEPRESSION 06/19/2009  . MIGRAINE HEADACHE  06/19/2009  . HYPERTENSION 06/19/2009  . ASTHMA 06/19/2009  . RHEUMATOID ARTHRITIS 06/19/2009  . OSTEOARTHRITIS 06/19/2009  . FIBROMYALGIA 06/19/2009  . LEG EDEMA 06/19/2009  . EPISTAXIS 06/19/2009  . DYSPNEA ON EXERTION 06/19/2009  . IMPAIRED FASTING GLUCOSE 06/19/2009    Past Surgical History:  Procedure Laterality Date  . CHOLECYSTECTOMY    . JOINT REPLACEMENT      OB History    No data available       Home Medications    Prior to Admission medications   Medication Sig Start Date End Date Taking? Authorizing Provider  ALPRAZolam Prudy Feeler) 1 MG tablet Take one-half tablet in the morning, one-half tablet in the afternoon, and one-half tablet in the evening. 05/21/13   Larena Sox, MD  amLODipine (NORVASC) 2.5 MG tablet Take 1 tablet (2.5 mg total) by mouth daily. 06/04/13   Iran Ouch, MD  APAP-Isometheptene-Dichloral (MIDRIN PO) Take by mouth as needed.     Historical Provider, MD  cefdinir (OMNICEF) 300 MG capsule Take 1 capsule (300 mg total) by mouth 2 (two) times daily. 08/04/14   Lattie Haw, MD  dicyclomine (BENTYL) 10 MG capsule Take 10 mg by mouth 4 (four) times daily as needed. 12/17/12   Historical Provider, MD  DULoxetine (CYMBALTA) 60 MG capsule Take 60 mg by mouth 2 (two) times daily. 02/13/13 08/04/14  Larena Sox, MD  fluticasone (FLOVENT HFA) 44 MCG/ACT inhaler Inhale 2 puffs into the lungs 2 (two) times daily. 08/04/14  Lattie Haw, MD  furosemide (LASIX) 20 MG tablet Take 20 mg by mouth as needed.  01/17/13   Historical Provider, MD  glipiZIDE (GLUCOTROL) 5 MG tablet Take by mouth daily before breakfast.    Historical Provider, MD  guaiFENesin-codeine 100-10 MG/5ML syrup Take 43mL by mouth at bedtime as needed for cough 08/04/14   Lattie Haw, MD  HYDROcodone-acetaminophen (NORCO) 10-325 MG per tablet Take 1 tablet by mouth 3 (three) times daily as needed. Pain 01/17/13   Historical Provider, MD  metoprolol tartrate (LOPRESSOR) 25 MG tablet  Take 25 mg by mouth daily.  02/08/13   Historical Provider, MD  omeprazole (PRILOSEC) 20 MG capsule Take 20 mg by mouth daily. 01/17/13   Historical Provider, MD  predniSONE (DELTASONE) 5 MG tablet Take 7.5 mg by mouth daily. 01/24/13   Historical Provider, MD  promethazine (PHENERGAN) 12.5 MG tablet Take 12.5 mg by mouth as needed. 01/17/13   Historical Provider, MD  topiramate (TOPAMAX) 25 MG tablet Take 50 mg by mouth 2 (two) times daily. 75 MG in the AM and 100 mg in the PM 02/08/13   Historical Provider, MD  venlafaxine XR (EFFEXOR XR) 37.5 MG 24 hr capsule Take 37.5 mg for 14 days, then increase to 75 mg daily. 05/21/13   Larena Sox, MD  VENTOLIN HFA 108 (90 BASE) MCG/ACT inhaler every 6 (six) hours as needed.  03/05/13   Historical Provider, MD    Family History Family History  Problem Relation Age of Onset  . Depression Mother   . Anxiety disorder Mother   . Bipolar disorder Neg Hx   . Schizophrenia Neg Hx   . Alcohol abuse Neg Hx   . Drug abuse Neg Hx   . OCD Neg Hx     Social History Social History  Substance Use Topics  . Smoking status: Former Smoker    Types: Cigarettes  . Smokeless tobacco: Never Used     Comment: irregular use for months.  . Alcohol use No     Comment: Patient stopped.     Allergies   Azithromycin; Escitalopram oxalate; Pregabalin; and Sertraline hcl   Review of Systems Review of Systems  Constitutional: Negative for chills, diaphoresis, fatigue and fever.  HENT: Negative for congestion, rhinorrhea and sneezing.   Eyes: Negative.   Respiratory: Positive for chest tightness and shortness of breath. Negative for cough.   Cardiovascular: Positive for chest pain. Negative for leg swelling.  Gastrointestinal: Positive for nausea. Negative for abdominal pain, blood in stool, diarrhea and vomiting.  Genitourinary: Negative for difficulty urinating, flank pain, frequency and hematuria.  Musculoskeletal: Negative for arthralgias and back pain.    Skin: Negative for rash.  Neurological: Negative for dizziness, speech difficulty, weakness, numbness and headaches.     Physical Exam Updated Vital Signs BP (!) 154/103   Pulse 87   Temp 98.7 F (37.1 C) (Oral)   Resp 26   SpO2 98%   Physical Exam  Constitutional: She is oriented to person, place, and time. She appears well-developed and well-nourished.  HENT:  Head: Normocephalic and atraumatic.  Eyes: Pupils are equal, round, and reactive to light.  Neck: Normal range of motion. Neck supple.  Cardiovascular: Normal rate, regular rhythm and normal heart sounds.   Pulmonary/Chest: Effort normal and breath sounds normal. No respiratory distress. She has no wheezes. She has no rales. She exhibits no tenderness.  Abdominal: Soft. Bowel sounds are normal. There is no tenderness. There is no rebound and no  guarding.  Musculoskeletal: Normal range of motion. She exhibits no edema.  Pedal pulses intact  Lymphadenopathy:    She has no cervical adenopathy.  Neurological: She is alert and oriented to person, place, and time.  Skin: Skin is warm and dry. No rash noted.  Psychiatric: She has a normal mood and affect.     ED Treatments / Results  Labs (all labs ordered are listed, but only abnormal results are displayed) Labs Reviewed  BASIC METABOLIC PANEL - Abnormal; Notable for the following:       Result Value   Glucose, Bld 236 (*)    BUN 5 (*)    All other components within normal limits  CBC WITH DIFFERENTIAL/PLATELET - Abnormal; Notable for the following:    RBC 6.20 (*)    MCV 65.6 (*)    MCH 20.8 (*)    RDW 15.7 (*)    All other components within normal limits  I-STAT TROPOININ, ED    EKG  EKG Interpretation  Date/Time:  Sunday June 19 2016 14:37:09 EST Ventricular Rate:  96 PR Interval:    QRS Duration: 99 QT Interval:  410 QTC Calculation: 519 R Axis:   1 Text Interpretation:  Sinus rhythm Atrial premature complex Prolonged QT interval since last  tracing no significant change Confirmed by Kendle Turbin  MD, Mackinley Kiehn ([id number]) on 06/19/2016 3:21:41 PM       Radiology Ct Angio Chest/abd/pel For Dissection W And/or W/wo  Result Date: 06/19/2016 CLINICAL DATA:  Chest pain and tightness for 2 days, initial encounter EXAM: CT ANGIOGRAPHY CHEST, ABDOMEN AND PELVIS TECHNIQUE: Multidetector CT imaging through the chest, abdomen and pelvis was performed using the standard protocol during bolus administration of intravenous contrast. Multiplanar reconstructed images and MIPs were obtained and reviewed to evaluate the vascular anatomy. CONTRAST:  100 mL of Isovue 370. COMPARISON:  None. FINDINGS: CTA CHEST FINDINGS Cardiovascular: Thoracic aorta demonstrates mild atherosclerotic calcifications although no aneurysmal dilatation or dissection is seen. The brachiocephalic vessels are within normal limits. The pulmonary artery shows a normal branching pattern without evidence of pulmonary emboli. There is evidence of a lipomatous hypertrophy of the intra-atrial septum. It exerts some mass effect upon the right atrium as well as the IVC as it enters the right atrium. Mediastinum/Nodes: 7 mm right peritracheal lymph node is noted. No other mediastinal or hilar adenopathy is seen. The thoracic inlet is within normal limits. Lungs/Pleura: The lungs are well aerated bilaterally. Patchy left lower lobe infiltrate is seen. Some dependent atelectatic changes are noted as well. In the medial aspect of the the left upper lobe best seen on image number 44 of series 9 there is a small 1 cm nodule identified. On the soft tissue windows this demonstrates some fat within and is consistent with a hamartoma. Musculoskeletal: Mild degenerative changes of the thoracic spine are seen. Review of the MIP images confirms the above findings. CTA ABDOMEN AND PELVIS FINDINGS VASCULAR Aorta: Widely patent without aneurysmal dilatation or dissection. Celiac: Widely patent SMA: Widely patent Renals: Patent  single renal arteries are noted bilaterally. IMA: Widely patent Iliacs: Widely patent Veins: Within normal limits. Review of the MIP images confirms the above findings. NON-VASCULAR Hepatobiliary: The liver is diffusely decreased in attenuation consistent with fatty infiltration. The gallbladder has been surgically removed. Pancreas: Unremarkable. No pancreatic ductal dilatation or surrounding inflammatory changes. Spleen: Normal in size without focal abnormality. Adrenals/Urinary Tract: Adrenal glands are unremarkable. Kidneys are normal, without renal calculi, focal lesion, or hydronephrosis. Bladder is unremarkable. Stomach/Bowel:  Scattered diverticular change of the colon is seen without definitive diverticulitis. No obstructive changes or inflammatory changes are seen in the bowel. The appendix is not well visualized although no inflammatory changes are seen. Lymphatic: No significant lymphadenopathy is noted. Reproductive: Uterus and bilateral adnexa are unremarkable. Other: No abdominal wall hernia or abnormality. No abdominopelvic ascites. Musculoskeletal: Changes of prior left hip replacement are seen. Some fatty replacement of the left psoas muscle and scattered pelvic muscles are noted Review of the MIP images confirms the above findings. IMPRESSION: No evidence of aortic dissection or aneurysm No evidence of pulmonary emboli. Fatty hypertrophy of the interatrial septum. Fatty infiltration of the liver. 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 Chronic changes in the abdomen as described. Electronically Signed   By: Alcide Clever M.D.   On: 06/19/2016 17:09    Procedures Procedures (including critical care time)  Medications Ordered in ED Medications  nitroGLYCERIN (NITROSTAT) SL tablet 0.4 mg (not administered)  morphine 4 MG/ML injection 4 mg (4 mg Intravenous Given 06/19/16 1603)  iopamidol (ISOVUE-370) 76 % injection (100 mLs  Contrast Given 06/19/16 1624)    aspirin chewable tablet 324 mg (324 mg Oral Given 06/19/16 1749)     Initial Impression / Assessment and Plan / ED Course  I have reviewed the triage vital signs and the nursing notes.  Pertinent labs & imaging results that were available during my care of the patient were reviewed by me and considered in my medical decision making (see chart for details).     Patient presents with chest pain. He does have an exertional component. There is no ischemic changes on EKG. Her troponin is negative. There is no evidence of aortic dissection. She was initially given morphine which did improve her symptoms but she still had some ongoing chest tightness. She was given nitroglycerin glycerin and aspirin as well. I consulted with unassigned medicine. She will be admitted by Dr. Jomarie Longs with [facility]  Final Clinical Impressions(s) / ED Diagnoses   Final diagnoses:  Chest pain, unspecified type    New Prescriptions New Prescriptions   No medications on file     Rolan Bucco, MD 06/19/16 1759

## 2016-06-19 NOTE — ED Provider Notes (Signed)
[facility] C[facility]    CSN: [id number] Arrival date & time: 06/19/16  1302     History   Chief Complaint Chief Complaint  Patient presents with  . Chest Pain    HPI Anne Bryant is a 71 y.o. female.   HPI 71 year old female presents to [facility]. Chest pain substernal and left side of chest, feels like pressure and heaviness, radiating to left arm and to her back. Associated with shortness of breath, nausea without vomiting and also diaphoresis at times. This started 1-1/2 days ago, pain awakened her from sleep in the early hours of Saturday morning, but she did not seek medical attention until now. A friend brought her here to [facility]. She did not try any particular treatment. No prior cardiac history. She does have a history of hypertension, hypercholesterolemia, type 2 diabetes. Past Medical History:  Diagnosis Date  . Arthritis   . Asthma   . Diabetes mellitus without complication (HCC)   . Diverticulitis   . GERD (gastroesophageal reflux disease)   . Hypertension   . Migraine     Patient Active Problem List   Diagnosis Date Noted  . Major depressive disorder, recurrent episode, severe, without mention of psychotic behavior 02/15/2013  . GAD (generalized anxiety disorder) 02/15/2013  . DYSPNEA 06/23/2009  . CHEST PAIN-PRECORDIAL 06/23/2009  . DM 06/19/2009  . HYPERLIPIDEMIA 06/19/2009  . LEUKOCYTOSIS 06/19/2009  . ANXIETY DISORDER 06/19/2009  . PANIC ATTACK 06/19/2009  . DEPRESSION 06/19/2009  . MIGRAINE HEADACHE 06/19/2009  . HYPERTENSION 06/19/2009  . ASTHMA 06/19/2009  . RHEUMATOID ARTHRITIS 06/19/2009  . OSTEOARTHRITIS 06/19/2009  . FIBROMYALGIA 06/19/2009  . LEG EDEMA 06/19/2009  . EPISTAXIS 06/19/2009  . DYSPNEA ON EXERTION 06/19/2009  . IMPAIRED FASTING GLUCOSE 06/19/2009    Past Surgical History:  Procedure Laterality Date  . CHOLECYSTECTOMY    . JOINT REPLACEMENT      OB History    No data  available       Home Medications    Prior to Admission medications   Medication Sig Start Date End Date Taking? Authorizing Provider  ALPRAZolam Prudy Feeler) 1 MG tablet Take one-half tablet in the morning, one-half tablet in the afternoon, and one-half tablet in the evening. 05/21/13   Larena Sox, MD  amLODipine (NORVASC) 2.5 MG tablet Take 1 tablet (2.5 mg total) by mouth daily. 06/04/13   Iran Ouch, MD  APAP-Isometheptene-Dichloral (MIDRIN PO) Take by mouth as needed.     Historical Provider, MD  cefdinir (OMNICEF) 300 MG capsule Take 1 capsule (300 mg total) by mouth 2 (two) times daily. 08/04/14   Lattie Haw, MD  dicyclomine (BENTYL) 10 MG capsule Take 10 mg by mouth 4 (four) times daily as needed. 12/17/12   Historical Provider, MD  DULoxetine (CYMBALTA) 60 MG capsule Take 60 mg by mouth 2 (two) times daily. 02/13/13 08/04/14  Larena Sox, MD  fluticasone (FLOVENT HFA) 44 MCG/ACT inhaler Inhale 2 puffs into the lungs 2 (two) times daily. 08/04/14   Lattie Haw, MD  furosemide (LASIX) 20 MG tablet Take 20 mg by mouth as needed.  01/17/13   Historical Provider, MD  glipiZIDE (GLUCOTROL) 5 MG tablet Take by mouth daily before breakfast.    Historical Provider, MD  guaiFENesin-codeine 100-10 MG/5ML syrup Take 58mL by mouth at bedtime as needed for cough 08/04/14   Lattie Haw, MD  HYDROcodone-acetaminophen (NORCO) 10-325 MG per tablet Take 1 tablet by mouth 3 (three) times daily  as needed. Pain 01/17/13   Historical Provider, MD  metoprolol tartrate (LOPRESSOR) 25 MG tablet Take 25 mg by mouth daily.  02/08/13   Historical Provider, MD  omeprazole (PRILOSEC) 20 MG capsule Take 20 mg by mouth daily. 01/17/13   Historical Provider, MD  predniSONE (DELTASONE) 5 MG tablet Take 7.5 mg by mouth daily. 01/24/13   Historical Provider, MD  promethazine (PHENERGAN) 12.5 MG tablet Take 12.5 mg by mouth as needed. 01/17/13   Historical Provider, MD  topiramate (TOPAMAX) 25 MG tablet Take 50  mg by mouth 2 (two) times daily. 75 MG in the AM and 100 mg in the PM 02/08/13   Historical Provider, MD  venlafaxine XR (EFFEXOR XR) 37.5 MG 24 hr capsule Take 37.5 mg for 14 days, then increase to 75 mg daily. 05/21/13   Larena Sox, MD  VENTOLIN HFA 108 (90 BASE) MCG/ACT inhaler every 6 (six) hours as needed.  03/05/13   Historical Provider, MD    Family History Family History  Problem Relation Age of Onset  . Depression Mother   . Anxiety disorder Mother   . Bipolar disorder Neg Hx   . Schizophrenia Neg Hx   . Alcohol abuse Neg Hx   . Drug abuse Neg Hx   . OCD Neg Hx     Social History Social History  Substance Use Topics  . Smoking status: Former Smoker    Types: Cigarettes  . Smokeless tobacco: Never Used     Comment: irregular use for months.  . Alcohol use No     Comment: Patient stopped.     Allergies   Azithromycin; Escitalopram oxalate; Pregabalin; and Sertraline hcl   Review of Systems Review of Systems See history of present illness  Physical Exam Triage Vital Signs ED Triage Vitals  Enc Vitals Group     BP 06/19/16 1333 (!) 178/105     Pulse Rate 06/19/16 1333 98     Resp 06/19/16 1333 18     Temp 06/19/16 1333 98 F (36.7 C)     Temp Source 06/19/16 1333 Oral     SpO2 06/19/16 1333 98 %     Weight 06/19/16 1334 193 lb (87.5 kg)     Height 06/19/16 1334 5' 1.5" (1.562 m)     Head Circumference --      Peak Flow --      Pain Score 06/19/16 1337 7     Pain Loc --      Pain Edu? --      Excl. in GC? --    No data found.   Updated Vital Signs BP (!) 178/105 (BP Location: Left Arm)   Pulse 98   Temp 98 F (36.7 C) (Oral)   Resp 18   Ht 5' 1.5" (1.562 m)   Wt 193 lb (87.5 kg)   SpO2 96%   BMI 35.88 kg/m   Visual Acuity Right Eye Distance:   Left Eye Distance:   Bilateral Distance:    Right Eye Near:   Left Eye Near:    Bilateral Near:     Physical Exam  Constitutional: She appears well-developed and well-nourished. She  appears distressed (Appears uncomfortable from chest pain. She is alert, oriented 3. Cooperative.).  HENT:  Head: Normocephalic.  Eyes: Pupils are equal, round, and reactive to light.  Neck: Neck supple.  Cardiovascular:  Regular rate and rhythm without murmur gallop or rub  Pulmonary/Chest: Breath sounds normal.  Abdominal: She exhibits no distension.  Musculoskeletal:  She exhibits no tenderness.  Neurological: No cranial nerve deficit.  Skin: Skin is warm. No rash noted.  Psychiatric: She has a normal mood and affect.     UC Treatments / Results  Labs (all labs ordered are listed, but only abnormal results are displayed) Labs Reviewed - No data to display  EKG  EKG Interpretation None     EKG sinus tachycardia, rate 104. Nonspecific ST abnormality, no STEMI Occasional PAC.  Radiology No results found.  Procedures Procedures (including critical care time)  Medications Ordered in UC Medications - No data to display   Initial Impression / Assessment and Plan / UC Course  I have reviewed the triage vital signs and the nursing notes.  Pertinent labs & imaging results that were available during my care of the patient were reviewed by me and considered in my medical decision making (see chart for details).       Final Clinical Impressions(s) / UC Diagnoses   Final diagnoses:  Precordial pain  I'm very concerned that this may be cardiac pain, although not STEMI. History is classic for cardiac history and risk factors of diabetes, age, hypertension, hypercholesterolemia.  EMS called Patient refused aspirin, saying that it causes nosebleeds. Here in urgent care, O2 started at 2 L/m. EMS arrived here approximately 1:39 PM. (EMS arrived here before IV could be started. Nitroglycerin did not given for EMS arrived) Transport to [facility] ED for further evaluation and treatment     Lajean Manes, MD 06/19/16 1359

## 2016-06-19 NOTE — H&P (Deleted)
Obese female laying in bed, no distress, AAO 3 Eyes: PERRL, lids and conjunctivae normal ENMT: Mucous membranes are moist.  Neck: normal, supple Respiratory: clear to auscultation bilaterally, no wheezing, no crackles. Normal respiratory effort. No accessory muscle use.  Cardiovascular: Regular rate and rhythm, no murmurs  Abdomen: no tenderness, no masses palpated. Bowel sounds positive.  Musculoskeletal: No edema Skin: no rashes, lesions, ulcers. No induration Neurologic: CN 2-12 grossly intact. Sensation intact, DTR normal. Strength 5/5 in all 4.  Psychiatric: Normal judgment and insight. Alert and oriented x 3. Normal mood.    Labs on Admission: I have personally reviewed following labs and imaging studies  CBC:  Recent Labs Lab 06/19/16 1449  WBC 7.8  NEUTROABS 5.2  HGB 12.9  HCT 40.7  MCV 65.6*  PLT 175   Basic Metabolic Panel:  Recent Labs Lab 06/19/16 1449  NA 138  K 3.7  CL 101  CO2 27  GLUCOSE 236*  BUN 5*  CREATININE 0.76  CALCIUM 9.4   GFR: Estimated Creatinine Clearance: 67.5 mL/min (by C-G formula based on SCr of 0.76 mg/dL). Liver Function Tests: No results for input(s): AST, ALT, ALKPHOS, BILITOT, PROT, ALBUMIN in the last 168 hours. No results for input(s): LIPASE, AMYLASE in the last 168 hours. No results for input(s): AMMONIA in the last 168 hours. Coagulation Profile: No results for input(s): INR, PROTIME in the last 168 hours. Cardiac Enzymes: No results for input(s): CKTOTAL, CKMB, CKMBINDEX, TROPONINI in the last 168 hours. BNP (last 3 results) No results for input(s): PROBNP in the last 8760 hours. HbA1C: No results for input(s): HGBA1C in the last 72 hours. CBG: No results for input(s): GLUCAP in the last 168 hours. Lipid Profile: No results for input(s): CHOL, HDL, LDLCALC, TRIG, CHOLHDL, LDLDIRECT in the last 72 hours. Thyroid Function Tests: No results for input(s): TSH, T4TOTAL, FREET4, T3FREE, THYROIDAB in the last 72  hours. Anemia Panel: No results for input(s): VITAMINB12, FOLATE, FERRITIN, TIBC, IRON, RETICCTPCT in the last 72 hours. Urine analysis:    Component Value Date/Time   COLORURINE YELLOW 10/21/2010 1024   APPEARANCEUR CLEAR 10/21/2010 1024   LABSPEC 1.018 10/21/2010 1024   PHURINE 5.5 10/21/2010 1024   GLUCOSEU NEGATIVE 10/21/2010 1024   HGBUR SMALL (A) 10/21/2010 1024   BILIRUBINUR negative 08/04/2014 1349   KETONESUR negative 08/04/2014 1349   KETONESUR NEGATIVE 10/21/2010 1024   PROTEINUR negative 08/04/2014 1349   PROTEINUR NEGATIVE 10/21/2010 1024   UROBILINOGEN 0.2 08/04/2014 1349   UROBILINOGEN 0.2 10/21/2010 1024   NITRITE Negative 08/04/2014 1349   NITRITE NEGATIVE 10/21/2010 1024   LEUKOCYTESUR Trace 08/04/2014 1349   Sepsis Labs: @LABRCNTIP (procalcitonin:4,lacticidven:4) )No results found for this or any previous visit (from the past 240 hour(s)).   Radiological Exams on Admission: Ct Angio Chest/abd/pel For Dissection W And/or W/wo  Result Date: 06/19/2016 CLINICAL DATA:  Chest pain and tightness for 2 days, initial encounter EXAM: CT ANGIOGRAPHY CHEST, ABDOMEN AND PELVIS TECHNIQUE: Multidetector CT imaging through the chest, abdomen and pelvis was performed using the standard protocol during bolus administration of intravenous contrast. Multiplanar reconstructed images and MIPs were obtained and reviewed to evaluate the vascular anatomy. CONTRAST:  100 mL of Isovue 370. COMPARISON:  None. FINDINGS: CTA CHEST FINDINGS Cardiovascular: Thoracic aorta demonstrates mild atherosclerotic calcifications although no aneurysmal dilatation or dissection is seen. The brachiocephalic vessels are within normal limits. The pulmonary artery shows a normal branching pattern without evidence of pulmonary emboli. There is evidence of a lipomatous hypertrophy of the intra-atrial  septum. It exerts some mass effect upon the right atrium as well as the IVC as it enters the right atrium.  Mediastinum/Nodes: 7 mm right peritracheal lymph node is noted. No other mediastinal or hilar adenopathy is seen. The thoracic inlet is within normal limits. Lungs/Pleura: The lungs are well aerated bilaterally. Patchy left lower lobe infiltrate is seen. Some dependent atelectatic changes are noted as well. In the medial aspect of the the left upper lobe best seen on image number 44 of series 9 there is a small 1 cm nodule identified. On the soft tissue windows this demonstrates some fat within and is consistent with a hamartoma. Musculoskeletal: Mild degenerative changes of the thoracic spine are seen. Review of the MIP images confirms the above findings. CTA ABDOMEN AND PELVIS FINDINGS VASCULAR Aorta: Widely patent without aneurysmal dilatation or dissection. Celiac: Widely patent SMA: Widely patent Renals: Patent single renal arteries are noted bilaterally. IMA: Widely patent Iliacs: Widely patent Veins: Within normal limits. Review of the MIP images confirms the above findings. NON-VASCULAR Hepatobiliary: The liver is diffusely decreased in attenuation consistent with fatty infiltration. The gallbladder has been surgically removed. Pancreas: Unremarkable. No pancreatic ductal dilatation or surrounding inflammatory changes. Spleen: Normal in size without focal abnormality. Adrenals/Urinary Tract: Adrenal glands are unremarkable. Kidneys are normal, without renal calculi, focal lesion, or hydronephrosis. Bladder is unremarkable. Stomach/Bowel: Scattered diverticular change of the colon is seen without definitive diverticulitis. No obstructive changes or inflammatory changes are seen in the bowel. The appendix is not well visualized although no inflammatory changes are seen. Lymphatic: No significant lymphadenopathy is noted. Reproductive: Uterus and bilateral adnexa are unremarkable. Other: No abdominal wall hernia or abnormality. No abdominopelvic ascites. Musculoskeletal: Changes of prior left hip replacement  are seen. Some fatty replacement of the left psoas muscle and scattered pelvic muscles are noted Review of the MIP images confirms the above findings. IMPRESSION: No evidence of aortic dissection or aneurysm No evidence of pulmonary emboli. Fatty hypertrophy of the interatrial septum. Fatty infiltration of the liver. 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 Chronic changes in the abdomen as described. Electronically Signed   By: Alcide Clever M.D.   On: 06/19/2016 17:09    EKG: Independently reviewed. NSR, no acute ST T wave changes  Assessment/Plan   1. Atypical chest pain with some typical features EKG without acute changes, troponin 1 negative, CT MG her chest and negative for PE or dissection- -Due to numerous risk factors namely diabetes, hypertension, dyslipidemia, family history future surgery will admit overnight for observation -Cycle troponins, monitor on telemetry, -Nothing by mouth after midnight, possible stress test per cardiology tomorrow -We'll send a staff message to cardiology  2.  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 Hypertension -Resume amlodipine  4. Anxiety/depression - resume Xanax and antidepressants  DVT prophylaxis: lovenox Code Status: Full Code Family Communication: None at bedside Disposition Plan: Home pending workup Consults called: None, sent inbox message to cardiology Admission status: observation   Zannie Cove MD [facility] Pager [phone]  If 7PM-7AM, please contact night-coverage [email or web address] Password [facility]  06/19/2016, 6:27 PM

## 2016-06-19 NOTE — ED Notes (Signed)
Carb mod tray ordered 

## 2016-06-19 NOTE — ED Notes (Signed)
Patient transported to CT 

## 2016-06-19 NOTE — H&P (Signed)
[facility] History and Physical  Anne Bryant UMP:[id number] DOB: Oct 18, 1945 DOA: 06/19/2016  Referring physician: EDP PCP: Etta Quill, PA-C   Chief Complaint:chest pain  HPI: Anne Bryant is a 71 y.o. female with past history of diabetes, hypertension, dyslipidemia, anxiety, depression, asthma, fibromyalgia presents to the ER for the above complaints. Patient reports that she had some chest tightness tightness on Monday along with a low-grade temperature of 99.4 and went to see her primary care doctor who thought she had a cold and felt her lungs were clear and she was advised to take Robitussin. Subsequently all week she's been having some intermittent chest tightness/pressure worse with exertion and sometimes radiating to her left arm and her shoulder blades, history is vague and inconsistent. At this time she denies any cough congestion fevers or chills.  ED course: She was noted to have a normal EKG, troponin 1 and a CT angiogram of her chest which was negative for PE and dissection   Review of Systems: As per history of present illness all other 10 systems reviewed and are negative   Past Medical History:  Diagnosis Date  . Arthritis   . Asthma   . Diabetes mellitus without complication (HCC)   . Diverticulitis   . GERD (gastroesophageal reflux disease)   . Hypertension   . Migraine    Past Surgical History:  Procedure Laterality Date  . CHOLECYSTECTOMY    . JOINT REPLACEMENT     Social History:  reports that she has quit smoking. Her smoking use included Cigarettes. She has never used smokeless tobacco. She reports that she does not drink alcohol or use drugs.  Allergies  Allergen Reactions  . Azithromycin     REACTION: makes pt feel funny  . Escitalopram Oxalate Other (See Comments)    Confusion   . Pregabalin     REACTION: dizzy, weakness  . Sertraline Hcl     Family History  Problem Relation Age of Onset  . Depression Mother     . Anxiety disorder Mother   . Bipolar disorder Neg Hx   . Schizophrenia Neg Hx   . Alcohol abuse Neg Hx   . Drug abuse Neg Hx   . OCD Neg Hx     Prior to Admission medications   Medication Sig Start Date End Date Taking? Authorizing Provider  ALPRAZolam Prudy Feeler) 1 MG tablet Take one-half tablet in the morning, one-half tablet in the afternoon, and one-half tablet in the evening. 05/21/13   Larena Sox, MD  amLODipine (NORVASC) 2.5 MG tablet Take 1 tablet (2.5 mg total) by mouth daily. 06/04/13   Iran Ouch, MD  APAP-Isometheptene-Dichloral (MIDRIN PO) Take by mouth as needed.     Historical Provider, MD  cefdinir (OMNICEF) 300 MG capsule Take 1 capsule (300 mg total) by mouth 2 (two) times daily. 08/04/14   Lattie Haw, MD  dicyclomine (BENTYL) 10 MG capsule Take 10 mg by mouth 4 (four) times daily as needed. 12/17/12   Historical Provider, MD  DULoxetine (CYMBALTA) 60 MG capsule Take 60 mg by mouth 2 (two) times daily. 02/13/13 08/04/14  Larena Sox, MD  fluticasone (FLOVENT HFA) 44 MCG/ACT inhaler Inhale 2 puffs into the lungs 2 (two) times daily. 08/04/14   Lattie Haw, MD  furosemide (LASIX) 20 MG tablet Take 20 mg by mouth as needed.  01/17/13   Historical Provider, MD  glipiZIDE (GLUCOTROL) 5 MG tablet Take by mouth daily before breakfast.    Historical  Provider, MD  guaiFENesin-codeine 100-10 MG/5ML syrup Take 46mL by mouth at bedtime as needed for cough 08/04/14   Lattie Haw, MD  HYDROcodone-acetaminophen (NORCO) 10-325 MG per tablet Take 1 tablet by mouth 3 (three) times daily as needed. Pain 01/17/13   Historical Provider, MD  metoprolol tartrate (LOPRESSOR) 25 MG tablet Take 25 mg by mouth daily.  02/08/13   Historical Provider, MD  omeprazole (PRILOSEC) 20 MG capsule Take 20 mg by mouth daily. 01/17/13   Historical Provider, MD  predniSONE (DELTASONE) 5 MG tablet Take 7.5 mg by mouth daily. 01/24/13   Historical Provider, MD  promethazine (PHENERGAN) 12.5 MG  tablet Take 12.5 mg by mouth as needed. 01/17/13   Historical Provider, MD  topiramate (TOPAMAX) 25 MG tablet Take 50 mg by mouth 2 (two) times daily. 75 MG in the AM and 100 mg in the PM 02/08/13   Historical Provider, MD  venlafaxine XR (EFFEXOR XR) 37.5 MG 24 hr capsule Take 37.5 mg for 14 days, then increase to 75 mg daily. 05/21/13   Larena Sox, MD  VENTOLIN HFA 108 (90 BASE) MCG/ACT inhaler every 6 (six) hours as needed.  03/05/13   Historical Provider, MD   Physical Exam: Vitals:   06/19/16 1515 06/19/16 1600 06/19/16 1615 06/19/16 1750  BP: (!) 127/113 (!) 147/128 147/92 (!) 154/103  Pulse:  88 86 87  Resp: 19 14 13 26   Temp:      TempSrc:      SpO2:  99% 96% 98%    Wt Readings from Last 3 Encounters:  06/19/16 87.5 kg (193 lb)  08/04/14 81.2 kg (179 lb)  06/04/13 80.3 kg (177 lb)    Obese female laying in bed, no distress, AAO 3 Eyes: PERRL, lids and conjunctivae normal ENMT: Mucous membranes are moist.  Neck: normal, supple Respiratory: clear to auscultation bilaterally, no wheezing, no crackles. Normal respiratory effort. No accessory muscle use.  Cardiovascular: Regular rate and rhythm, no murmurs  Abdomen: no tenderness, no masses palpated. Bowel sounds positive.  Musculoskeletal: No edema Skin: no rashes, lesions, ulcers. No induration Neurologic: CN 2-12 grossly intact. Sensation intact, DTR normal. Strength 5/5 in all 4.  Psychiatric: Normal judgment and insight. Alert and oriented x 3. Normal mood.    Labs on Admission: I have personally reviewed following labs and imaging studies  CBC:  Last Labs    Recent Labs Lab 06/19/16 1449  WBC 7.8  NEUTROABS 5.2  HGB 12.9  HCT 40.7  MCV 65.6*  PLT 175     Basic Metabolic Panel:  Last Labs    Recent Labs Lab 06/19/16 1449  NA 138  K 3.7  CL 101  CO2 27  GLUCOSE 236*  BUN 5*  CREATININE 0.76  CALCIUM 9.4     GFR: Estimated Creatinine Clearance: 67.5 mL/min (by C-G formula based on  SCr of 0.76 mg/dL). Liver Function Tests: Last Labs   No results for input(s): AST, ALT, ALKPHOS, BILITOT, PROT, ALBUMIN in the last 168 hours.   Last Labs   No results for input(s): LIPASE, AMYLASE in the last 168 hours.   Last Labs   No results for input(s): AMMONIA in the last 168 hours.   Coagulation Profile: Last Labs   No results for input(s): INR, PROTIME in the last 168 hours.   Cardiac Enzymes: Last Labs   No results for input(s): CKTOTAL, CKMB, CKMBINDEX, TROPONINI in the last 168 hours.   BNP (last 3 results) Recent Labs (within last  365 days)  No results for input(s): PROBNP in the last 8760 hours.   HbA1C: Recent Labs (last 2 labs)   No results for input(s): HGBA1C in the last 72 hours.   CBG: Last Labs   No results for input(s): GLUCAP in the last 168 hours.   Lipid Profile: Recent Labs (last 2 labs)   No results for input(s): CHOL, HDL, LDLCALC, TRIG, CHOLHDL, LDLDIRECT in the last 72 hours.   Thyroid Function Tests: Recent Labs (last 2 labs)   No results for input(s): TSH, T4TOTAL, FREET4, T3FREE, THYROIDAB in the last 72 hours.   Anemia Panel: Recent Labs (last 2 labs)   No results for input(s): VITAMINB12, FOLATE, FERRITIN, TIBC, IRON, RETICCTPCT in the last 72 hours.   Urine analysis: Labs (Brief)          Component Value Date/Time   COLORURINE YELLOW 10/21/2010 1024   APPEARANCEUR CLEAR 10/21/2010 1024   LABSPEC 1.018 10/21/2010 1024   PHURINE 5.5 10/21/2010 1024   GLUCOSEU NEGATIVE 10/21/2010 1024   HGBUR SMALL (A) 10/21/2010 1024   BILIRUBINUR negative 08/04/2014 1349   KETONESUR negative 08/04/2014 1349   KETONESUR NEGATIVE 10/21/2010 1024   PROTEINUR negative 08/04/2014 1349   PROTEINUR NEGATIVE 10/21/2010 1024   UROBILINOGEN 0.2 08/04/2014 1349   UROBILINOGEN 0.2 10/21/2010 1024   NITRITE Negative 08/04/2014 1349   NITRITE NEGATIVE 10/21/2010 1024   LEUKOCYTESUR Trace 08/04/2014 1349     Sepsis  Labs: @LABRCNTIP (procalcitonin:4,lacticidven:4) )No results found for this or any previous visit (from the past 240 hour(s)).   Radiological Exams on Admission:  Imaging Results (Last 48 hours)  Ct Angio Chest/abd/pel For Dissection W And/or W/wo  Result Date: 06/19/2016 CLINICAL DATA:  Chest pain and tightness for 2 days, initial encounter EXAM: CT ANGIOGRAPHY CHEST, ABDOMEN AND PELVIS TECHNIQUE: Multidetector CT imaging through the chest, abdomen and pelvis was performed using the standard protocol during bolus administration of intravenous contrast. Multiplanar reconstructed images and MIPs were obtained and reviewed to evaluate the vascular anatomy. CONTRAST:  100 mL of Isovue 370. COMPARISON:  None. FINDINGS: CTA CHEST FINDINGS Cardiovascular: Thoracic aorta demonstrates mild atherosclerotic calcifications although no aneurysmal dilatation or dissection is seen. The brachiocephalic vessels are within normal limits. The pulmonary artery shows a normal branching pattern without evidence of pulmonary emboli. There is evidence of a lipomatous hypertrophy of the intra-atrial septum. It exerts some mass effect upon the right atrium as well as the IVC as it enters the right atrium. Mediastinum/Nodes: 7 mm right peritracheal lymph node is noted. No other mediastinal or hilar adenopathy is seen. The thoracic inlet is within normal limits. Lungs/Pleura: The lungs are well aerated bilaterally. Patchy left lower lobe infiltrate is seen. Some dependent atelectatic changes are noted as well. In the medial aspect of the the left upper lobe best seen on image number 44 of series 9 there is a small 1 cm nodule identified. On the soft tissue windows this demonstrates some fat within and is consistent with a hamartoma. Musculoskeletal: Mild degenerative changes of the thoracic spine are seen. Review of the MIP images confirms the above findings. CTA ABDOMEN AND PELVIS FINDINGS VASCULAR Aorta: Widely patent without  aneurysmal dilatation or dissection. Celiac: Widely patent SMA: Widely patent Renals: Patent single renal arteries are noted bilaterally. IMA: Widely patent Iliacs: Widely patent Veins: Within normal limits. Review of the MIP images confirms the above findings. NON-VASCULAR Hepatobiliary: The liver is diffusely decreased in attenuation consistent with fatty infiltration. The gallbladder has been surgically removed.  Pancreas: Unremarkable. No pancreatic ductal dilatation or surrounding inflammatory changes. Spleen: Normal in size without focal abnormality. Adrenals/Urinary Tract: Adrenal glands are unremarkable. Kidneys are normal, without renal calculi, focal lesion, or hydronephrosis. Bladder is unremarkable. Stomach/Bowel: Scattered diverticular change of the colon is seen without definitive diverticulitis. No obstructive changes or inflammatory changes are seen in the bowel. The appendix is not well visualized although no inflammatory changes are seen. Lymphatic: No significant lymphadenopathy is noted. Reproductive: Uterus and bilateral adnexa are unremarkable. Other: No abdominal wall hernia or abnormality. No abdominopelvic ascites. Musculoskeletal: Changes of prior left hip replacement are seen. Some fatty replacement of the left psoas muscle and scattered pelvic muscles are noted Review of the MIP images confirms the above findings. IMPRESSION: No evidence of aortic dissection or aneurysm No evidence of pulmonary emboli. Fatty hypertrophy of the interatrial septum. Fatty infiltration of the liver. 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 Chronic changes in the abdomen as described. Electronically Signed   By: Alcide Clever M.D.   On: 06/19/2016 17:09     EKG: Independently reviewed. NSR, no acute ST T wave changes  Assessment/Plan   1. Atypical chest pain with some typical features -EKG without acute changes, troponin 1 negative, CTA chest and negative for PE  or dissection- -Due to numerous cardiac risk factors namely diabetes, hypertension, dyslipidemia, family history future surgery will admit overnight for observation -Cycle troponins, monitor on telemetry, -Nothing by mouth after midnight, possible stress test per cardiology tomorrow -We'll send a staff message to cardiology  2.  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 Hypertension -Resume amlodipine  4. Anxiety/depression - resume Xanax and antidepressants  DVT prophylaxis: lovenox Code Status: Full Code Family Communication: None at bedside Disposition Plan: Home pending workup Consults called: None, sent inbox message to cardiology Admission status: observation   Zannie Cove MD [facility] Pager [phone]   Time spent: 

## 2016-06-19 NOTE — ED Notes (Signed)
Pt returned from CT and connected to the monitor 

## 2016-06-19 NOTE — ED Triage Notes (Signed)
Pt here from Urgent Care in [place] for c.o. Chest pain since Friday. Pt went in for a chest Xray thinking she had a cold. Pt states the chest pain is constant and is anywhere from her chest, to arm and sometimes in her neck. Pt has also had occasional nausea. Nauseated currently. Refused asprin by EMS due to nose bleeds that occurred in the past from taking. EMS was unable to give her Nitro due to inconsistent blood pressures, noted to be 60 points less systolic in the right arm than in the left arm, this was checked and confirmed manually as well as automatically.  Pt has hx of heart cath. 20G PIV placed in LAC. Pain 6/10. Pt also reports a cough and low grade fevers.

## 2016-06-19 NOTE — ED Notes (Signed)
Patient transferred to [facility] ED with CP. Patient transferred via [facility]. EMS. Patient stable alert and oriented. Report given to paramedics. Patient was placed on O2 @2  liters per nasal cannula, and she refused Aspirin.

## 2016-06-19 NOTE — ED Notes (Signed)
Attempted to call report, Ray, RN stated he is going to look thru her chart and give me a call back

## 2016-06-20 ENCOUNTER — Observation Stay (HOSPITAL_BASED_OUTPATIENT_CLINIC_OR_DEPARTMENT_OTHER): Payer: Medicare Other

## 2016-06-20 DIAGNOSIS — F411 Generalized anxiety disorder: Secondary | ICD-10-CM | POA: Diagnosis not present

## 2016-06-20 DIAGNOSIS — I200 Unstable angina: Secondary | ICD-10-CM | POA: Diagnosis not present

## 2016-06-20 DIAGNOSIS — R079 Chest pain, unspecified: Secondary | ICD-10-CM

## 2016-06-20 DIAGNOSIS — E119 Type 2 diabetes mellitus without complications: Secondary | ICD-10-CM | POA: Diagnosis not present

## 2016-06-20 DIAGNOSIS — I10 Essential (primary) hypertension: Secondary | ICD-10-CM

## 2016-06-20 DIAGNOSIS — R0789 Other chest pain: Secondary | ICD-10-CM | POA: Diagnosis not present

## 2016-06-20 LAB — BASIC METABOLIC PANEL
Anion gap: 10 (ref 5–15)
BUN: 10 mg/dL (ref 6–20)
CALCIUM: 9.3 mg/dL (ref 8.9–10.3)
CO2: 28 mmol/L (ref 22–32)
CREATININE: 0.95 mg/dL (ref 0.44–1.00)
Chloride: 99 mmol/L — ABNORMAL LOW (ref 101–111)
GFR calc Af Amer: >60 mL/min (ref >60)
GFR calc non Af Amer: 60 mL/min — ABNORMAL LOW (ref >60)
GLUCOSE: 278 mg/dL — AB (ref 65–99)
Potassium: 3.9 mmol/L (ref 3.5–5.1)
Sodium: 137 mmol/L (ref 135–145)

## 2016-06-20 LAB — GLUCOSE, CAPILLARY
GLUCOSE-CAPILLARY: 221 mg/dL — AB (ref 65–99)
GLUCOSE-CAPILLARY: 194 mg/dL — AB (ref 65–99)
GLUCOSE-CAPILLARY: 231 mg/dL — AB (ref 65–99)

## 2016-06-20 LAB — NM MYOCAR MULTI W/SPECT W/WALL MOTION / EF
CHL RATE OF PERCEIVED EXERTION: 0
CSEPED: 0 min
CSEPEW: 1 METS
Exercise duration (sec): 0 s
MPHR: 151 {beats}/min
Peak HR: 91 {beats}/min
Percent HR: 60 %
Rest HR: 77 {beats}/min

## 2016-06-20 LAB — CBC
HCT: 38.5 % (ref 36.0–46.0)
Hemoglobin: 12.1 g/dL (ref 12.0–15.0)
MCH: 20.9 pg — AB (ref 26.0–34.0)
MCHC: 31.4 g/dL (ref 30.0–36.0)
MCV: 66.5 fL — ABNORMAL LOW (ref 78.0–100.0)
Platelets: 183 10*3/uL (ref 150–400)
RBC: 5.79 MIL/uL — ABNORMAL HIGH (ref 3.87–5.11)
RDW: 15.6 % — AB (ref 11.5–15.5)
WBC: 9.2 10*3/uL (ref 4.0–10.5)

## 2016-06-20 LAB — TROPONIN I: Troponin I: <0.03 ng/mL (ref <0.03)

## 2016-06-20 MED ORDER — REGADENOSON 0.4 MG/5ML IV SOLN
INTRAVENOUS | Status: AC
  Filled 2016-06-20: qty 5

## 2016-06-20 MED ORDER — AMINOPHYLLINE 25 MG/ML IV (NUC MED)
75.0000 mg | Freq: Once | INTRAVENOUS | Status: AC
  Administered 2016-06-20: 75 mg via INTRAVENOUS

## 2016-06-20 MED ORDER — TECHNETIUM TC 99M TETROFOSMIN IV KIT
10.0000 | PACK | Freq: Once | INTRAVENOUS | Status: AC | PRN
  Administered 2016-06-20: 10 via INTRAVENOUS

## 2016-06-20 MED ORDER — AMINOPHYLLINE 25 MG/ML IV SOLN
INTRAVENOUS | Status: AC
  Filled 2016-06-20: qty 10

## 2016-06-20 MED ORDER — MORPHINE SULFATE (PF) 2 MG/ML IV SOLN
2.0000 mg | Freq: Once | INTRAVENOUS | Status: AC
  Administered 2016-06-20: 2 mg via INTRAVENOUS

## 2016-06-20 MED ORDER — MORPHINE SULFATE (PF) 4 MG/ML IV SOLN
INTRAVENOUS | Status: AC
  Filled 2016-06-20: qty 1

## 2016-06-20 MED ORDER — REGADENOSON 0.4 MG/5ML IV SOLN
0.4000 mg | Freq: Once | INTRAVENOUS | Status: AC
  Administered 2016-06-20: 0.4 mg via INTRAVENOUS
  Filled 2016-06-20: qty 5

## 2016-06-20 MED ORDER — TECHNETIUM TC 99M TETROFOSMIN IV KIT
30.0000 | PACK | Freq: Once | INTRAVENOUS | Status: AC | PRN
  Administered 2016-06-20: 30 via INTRAVENOUS

## 2016-06-20 NOTE — Progress Notes (Signed)
Inpatient Diabetes Program Recommendations  AACE/ADA: New Consensus Statement on Inpatient Glycemic Control (2015)  Target Ranges:  Prepandial:   less than 140 mg/dL      Peak postprandial:   less than 180 mg/dL (1-2 hours)      Critically ill patients:  140 - 180 mg/dL   Lab Results  Component Value Date   GLUCAP 221 (H) 06/20/2016    Review of Glycemic Control  Diabetes history: DM2 Outpatient Diabetes medications: Glipizide 5 mg daily Current orders for Inpatient glycemic control: NPO, Sensitive correction scale Novolog 0-9 units [facility]   Inpatient Diabetes Program Recommendations:    Please consider changing frequency of Sensitive correction scale Novolog 0-9 units to Q4H while NPO.    Per ADA recommendations "consider performing an A1C on all patients with diabetes or hyperglycemia admitted to the hospital if not performed in the prior 3 months".  Thank you,  Kristine Linea, RN, MSN Diabetes Coordinator Inpatient Diabetes Program [phone] (Team Pager)

## 2016-06-20 NOTE — Progress Notes (Signed)
MD Jomarie Longs called asked to place verbal discharge order for pt home independently. Pt discharge education went over at bedside with pt and family friend. Pt IV discontinued catheter intact, and telemetry removed. Pt has all belongings and discharge paperwork. Pt discharged via wheelchair with nurse tech   Starlina Lapre Elige Radon

## 2016-06-20 NOTE — Progress Notes (Signed)
Pt seen and examined, still with intermittent atypical chest pain, EKG with Non specific T wave changes, Troponin negative CTA chest negative for PE/dissection Cards consulted, Myoview today DC home if study normal  Zannie Cove MD

## 2016-06-20 NOTE — Consult Note (Signed)
The patient has been seen in conjunction with Vin Bhagat, PAC. All aspects of care have been considered and discussed. The patient has been personally interviewed, examined, and all clinical data has been reviewed.   Prior cardiac evaluations have been unremarkable including a widely patent coronaries and angiography in 2012 and normal stress exercise treadmill test in 2015. Echo done at the same time revealed normal LV function.  Exam is normal  ECG, cardiac markers, and other clinical data have excluded myocardial infarction.  Symptom is new in comparison to prior complaints. Given the significant family history of CAD along with personal history of diabetes mellitus, obstructive coronary disease needs to be excluded. In this situation we recommend pharmacologic Myoview perfusion imaging. If intermediate or high risk, she may again need coronary angiography. CARDIOLOGY CONSULT NOTE   Patient ID: Anne Bryant MRN: [id number] DOB/AGE: 1946-04-14 71 y.o.  Admit date: 06/19/2016  Primary Physician   Etta Quill, PA-C Primary Cardiologist   Dr. Kirke Corin (last seen 05/2013) Reason for Consultation   Chest pain  Requesting Physician  Dr.   HPI: Anne Bryant is a 71 y.o. female with a history of Hypertension, fibromyalgia, hiatal hernia, GERD, diabetes and OSA (nontolerant of CPAP) just for evaluation of chest pain.  She was admitted to [facility] on 05/20/10 with chest pain. Cardiac enzymes were negative. Cardiac cath on 05/21/10 with no evidence of CAD. Her LV function was normal.   Noted dyspnea with exertion January 2015. Follow up echo showed normal LV systolic function with no significant valvular abnormalities. Treadmill stress test was negative for ischemia. However, she was hyprtensive at baseline with hypertensive response with exercise. Blood pressure went up to 220 systolic. She had chest discomfort but no significant EKG changes.  Last seen by Dr.  Kirke Corin 06/04/13.  Friday night patient woke up withsubsternal chest pressure radiating to her left arm. She questioned that she woke up from chest patient or  going to bathroom. Since then patient has a intermittent chest pressure lasting up to 1 hour with occasional radiation to left arm. Also has nausea and shortness of breath and diaphoresis. She has a chronic hiatal hernia with nausea and vomiting due to under control acid reflux. However this episode is different. She went to urgent care yesterday and sent to ER for further evaluation.  Troponin x 3 negative. Scr and electrolyte stable. CTA negative for PE or dissection. EKG on admission shows normal sinus rhythm.  Recent had a episode of left upper chest pressure 5 out of 10 this morning. Pain resolved with SL nitro x 1. Repeat EKG showed nonspecific T-wave changes in anterior lateral leads. Currently chest pain-free.    Past Medical History:  Diagnosis Date  . Arthritis   . Asthma   . Diabetes mellitus without complication (HCC)   . Diverticulitis   . Fibromyalgia 2006  . GERD (gastroesophageal reflux disease)   . Hypertension   . Migraine      Past Surgical History:  Procedure Laterality Date  . CHOLECYSTECTOMY    . JOINT REPLACEMENT      Allergies  Allergen Reactions  . Escitalopram Oxalate Other (See Comments)    Confusion   . Azithromycin Rash and Other (See Comments)    REACTION: makes pt feel funny  . Clarithromycin Nausea And Vomiting  . Doxycycline Diarrhea and Other (See Comments)    Stomach issues  . Fluoxetine Other (See Comments)    Irritability, anger  . Morphine And  Related Other (See Comments)    headaches  . Pregabalin Other (See Comments)    REACTION: dizzy, weakness   . Sertraline Hcl Other (See Comments)    DIDN'T WORK FOR PATIENT  . Tetanus Toxoid Adsorbed Other (See Comments)    I have reviewed the patient's current medications . amLODipine  2.5 mg Oral Daily  . aspirin  324 mg Oral Daily    . enoxaparin (LOVENOX) injection  40 mg Subcutaneous Q24H  . insulin aspart  0-9 Units Subcutaneous TID WC  . metoprolol succinate  25 mg Oral Daily  . pantoprazole  40 mg Oral Daily  . topiramate  100 mg Oral QHS    acetaminophen **OR** acetaminophen, nitroGLYCERIN, ondansetron **OR** ondansetron (ZOFRAN) IV  Prior to Admission medications   Medication Sig Start Date End Date Taking? Authorizing Provider  amitriptyline (ELAVIL) 25 MG tablet Take 50 mg by mouth at bedtime. 05/05/16  Yes Historical Provider, MD  amLODipine (NORVASC) 2.5 MG tablet Take 1 tablet (2.5 mg total) by mouth daily. 06/04/13  Yes Iran Ouch, MD  atorvastatin (LIPITOR) 10 MG tablet Take 10 mg by mouth daily at 6 PM.   Yes Historical Provider, MD  cetirizine (ZYRTEC) 10 MG tablet Take 10 mg by mouth every evening.   Yes Historical Provider, MD  diclofenac (VOLTAREN) 50 MG EC tablet Take 50 mg by mouth daily. 05/19/16  Yes Historical Provider, MD  dicyclomine (BENTYL) 20 MG tablet Take 20 mg by mouth 2 (two) times daily as needed for spasms.  05/05/16  Yes Historical Provider, MD  DULoxetine (CYMBALTA) 30 MG capsule Take 30 mg by mouth daily.   Yes Historical Provider, MD  furosemide (LASIX) 20 MG tablet Take 20 mg by mouth as needed for fluid or edema.  01/17/13  Yes Historical Provider, MD  GLIPIZIDE XL 5 MG 24 hr tablet Take 5 mg by mouth daily before breakfast. 03/14/16  Yes Historical Provider, MD  metoprolol succinate (TOPROL-XL) 25 MG 24 hr tablet Take 25 mg by mouth daily.   Yes Historical Provider, MD  oxyCODONE-acetaminophen (PERCOCET/ROXICET) 5-325 MG tablet Take 1 tablet by mouth every 8 (eight) hours as needed for severe pain.   Yes Historical Provider, MD  pantoprazole (PROTONIX) 40 MG tablet Take 40 mg by mouth 2 (two) times daily.   Yes Historical Provider, MD  predniSONE (DELTASONE) 5 MG tablet Take 7.5 mg by mouth daily. 01/24/13  Yes Historical Provider, MD  sucralfate (CARAFATE) 1 GM/10ML suspension  Take 10 mLs by mouth daily as needed for irritation. 02/09/16  Yes Historical Provider, MD  topiramate (TOPAMAX) 25 MG tablet Take 100 mg by mouth at bedtime.  02/08/13  Yes Historical Provider, MD  VENTOLIN HFA 108 (90 BASE) MCG/ACT inhaler Inhale 2 puffs into the lungs every 6 (six) hours as needed for shortness of breath.  03/05/13  Yes Historical Provider, MD  Vitamin D, Ergocalciferol, (DRISDOL) 50000 units CAPS capsule Take 50,000 Units by mouth every Saturday. 03/23/15  Yes Historical Provider, MD     Social History   Social History  . Marital status: Married    Spouse name: N/A  . Number of children: N/A  . Years of education: N/A   Occupational History  . Not on file.   Social History Main Topics  . Smoking status: Former Smoker    Types: Cigarettes  . Smokeless tobacco: Never Used     Comment: irregular use for months.  . Alcohol use No     Comment: Patient  stopped.  . Drug use: No     Comment: Caffeine: Coffee 1 cup per day. Caffeinated Beverages 32 ounces per day. 8 ounces of soda  . Sexual activity: Not Currently    Partners: Male   Other Topics Concern  . Not on file   Social History Narrative  . No narrative on file    Family Status  Relation Status  . Mother   . Neg Hx      ROS:  Full 14 point review of systems complete and found to be negative unless listed above.  Physical Exam: Blood pressure 112/68, pulse 82, temperature 97.5 F (36.4 C), temperature source Oral, resp. rate 16, height 5' 1.5" (1.562 m), weight 188 lb 11.2 oz (85.6 kg), SpO2 97 %.  General: Well developed, well nourished, female in no acute distress Head: Eyes PERRLA, No xanthomas. Normocephalic and atraumatic, oropharynx without edema or exudate.  Lungs: Resp regular and unlabored, CTA. Heart: RRR no s3, s4, or murmurs. CP is reproducible with palpation.  Neck: No carotid bruits. No lymphadenopathy.  No JVD. Abdomen: Bowel sounds present, abdomen soft and non-tender without masses  or hernias noted. Msk:  No spine or cva tenderness. No weakness, no joint deformities or effusions. Extremities: No clubbing, cyanosis or edema. DP/PT/Radials 2+ and equal bilaterally. Neuro: Alert and oriented X 3. No focal deficits noted. Psych:  Good affect, responds appropriately Skin: No rashes or lesions noted.  Labs:   Lab Results  Component Value Date   WBC 9.2 06/20/2016   HGB 12.1 06/20/2016   HCT 38.5 06/20/2016   MCV 66.5 (L) 06/20/2016   PLT 183 06/20/2016   No results for input(s): INR in the last 72 hours.  Recent Labs Lab 06/20/16 0200  NA 137  K 3.9  CL 99*  CO2 28  BUN 10  CREATININE 0.95  CALCIUM 9.3  GLUCOSE 278*   No results found for: MG  Recent Labs  06/19/16 2110 06/20/16 0200 06/20/16 0907  TROPONINI <0.03 <0.03 <0.03    Recent Labs  06/19/16 1454  TROPIPOC 0.00   No results found for: PROBNP Lab Results  Component Value Date   CHOL  05/21/2010    198        ATP III CLASSIFICATION:  <200     mg/dL   Desirable  034-742  mg/dL   Borderline High  >=595    mg/dL   High          HDL 35 (L) 05/21/2010   LDLCALC  05/21/2010    UNABLE TO CALCULATE IF TRIGLYCERIDE OVER 400 mg/dL        Total Cholesterol/HDL:CHD Risk Coronary Heart Disease Risk Table                     Men   Women  1/2 Average Risk   3.4   3.3  Average Risk       5.0   4.4  2 X Average Risk   9.6   7.1  3 X Average Risk  23.4   11.0        Use the calculated Patient Ratio above and the CHD Risk Table to determine the patient's CHD Risk.        ATP III CLASSIFICATION (LDL):  <100     mg/dL   Optimal  638-756  mg/dL   Near or Above  Optimal  130-159  mg/dL   Borderline  076-226  mg/dL   High  >333     mg/dL   Very High   TRIG 545 (H) 05/21/2010   Lab Results  Component Value Date   DDIMER  05/20/2010    0.45        AT THE INHOUSE ESTABLISHED CUTOFF VALUE OF 0.48 ug/mL FEU, THIS ASSAY HAS BEEN DOCUMENTED IN THE LITERATURE TO HAVE A  SENSITIVITY AND NEGATIVE PREDICTIVE VALUE OF AT LEAST 98 TO 99%.  THE TEST RESULT SHOULD BE CORRELATED WITH AN ASSESSMENT OF THE CLINICAL PROBABILITY OF DVT / VTE.   No results found for: LIPASE, AMYLASE No results found for: TSH, T4TOTAL, T3FREE, THYROIDAB No results found for: VITAMINB12, FOLATE, FERRITIN, TIBC, IRON, RETICCTPCT  Echo: 05/2013 LV EF: 60% -  65%  ------------------------------------------------------------ Indications:   Shortness of breath 786.05.  ------------------------------------------------------------ History:  PMH: Obstructive sleep apnea. Acquired from the patient and from the patient's chart. Chest pain. Bilateral lower extremity edema. Risk factors: Former tobacco use. Hypertension. Diabetes mellitus. Obese. Dyslipidemia.  ------------------------------------------------------------ Study Conclusions  - Left ventricle: The cavity size was normal. Systolic function was normal. The estimated ejection fraction was in the range of 60% to 65%. Wall motion was normal; there were no regional wall motion abnormalities. - Atrial septum: No defect or patent foramen ovale was identified.  Cath 05/2010 FINDINGS: 1. Hemodynamics, LV 146/18, aorta 147/90. 2. Left ventriculography.  EF was estimated at 60%.  There were no     regional wall motion abnormalities. 3. Right coronary artery.  The right coronary artery was a dominant     vessel.  There was no angiographic coronary artery disease. 4. Left main.  The left main had no angiographic coronary artery     disease. 5. Left circumflex.  The left circumflex had no angiographic coronary     artery disease. 6. LAD system.  The LAD has diagonals, had no angiographic coronary     artery disease.  IMPRESSION:  This is a 71 year old who presented with noncardiac chest pain. There is no angiographic coronary artery disease, left ventricular systolic function is normal.  She does need blood  pressure control and she will go home on ramipril and Norvasc.  Radiology:  Ct Angio Chest/abd/pel For Dissection W And/or W/wo  Result Date: 06/19/2016 CLINICAL DATA:  Chest pain and tightness for 2 days, initial encounter EXAM: CT ANGIOGRAPHY CHEST, ABDOMEN AND PELVIS TECHNIQUE: Multidetector CT imaging through the chest, abdomen and pelvis was performed using the standard protocol during bolus administration of intravenous contrast. Multiplanar reconstructed images and MIPs were obtained and reviewed to evaluate the vascular anatomy. CONTRAST:  100 mL of Isovue 370. COMPARISON:  None. FINDINGS: CTA CHEST FINDINGS Cardiovascular: Thoracic aorta demonstrates mild atherosclerotic calcifications although no aneurysmal dilatation or dissection is seen. The brachiocephalic vessels are within normal limits. The pulmonary artery shows a normal branching pattern without evidence of pulmonary emboli. There is evidence of a lipomatous hypertrophy of the intra-atrial septum. It exerts some mass effect upon the right atrium as well as the IVC as it enters the right atrium. Mediastinum/Nodes: 7 mm right peritracheal lymph node is noted. No other mediastinal or hilar adenopathy is seen. The thoracic inlet is within normal limits. Lungs/Pleura: The lungs are well aerated bilaterally. Patchy left lower lobe infiltrate is seen. Some dependent atelectatic changes are noted as well. In the medial aspect of the the left upper lobe best seen on image number 44 of series  9 there is a small 1 cm nodule identified. On the soft tissue windows this demonstrates some fat within and is consistent with a hamartoma. Musculoskeletal: Mild degenerative changes of the thoracic spine are seen. Review of the MIP images confirms the above findings. CTA ABDOMEN AND PELVIS FINDINGS VASCULAR Aorta: Widely patent without aneurysmal dilatation or dissection. Celiac: Widely patent SMA: Widely patent Renals: Patent single renal arteries are noted  bilaterally. IMA: Widely patent Iliacs: Widely patent Veins: Within normal limits. Review of the MIP images confirms the above findings. NON-VASCULAR Hepatobiliary: The liver is diffusely decreased in attenuation consistent with fatty infiltration. The gallbladder has been surgically removed. Pancreas: Unremarkable. No pancreatic ductal dilatation or surrounding inflammatory changes. Spleen: Normal in size without focal abnormality. Adrenals/Urinary Tract: Adrenal glands are unremarkable. Kidneys are normal, without renal calculi, focal lesion, or hydronephrosis. Bladder is unremarkable. Stomach/Bowel: Scattered diverticular change of the colon is seen without definitive diverticulitis. No obstructive changes or inflammatory changes are seen in the bowel. The appendix is not well visualized although no inflammatory changes are seen. Lymphatic: No significant lymphadenopathy is noted. Reproductive: Uterus and bilateral adnexa are unremarkable. Other: No abdominal wall hernia or abnormality. No abdominopelvic ascites. Musculoskeletal: Changes of prior left hip replacement are seen. Some fatty replacement of the left psoas muscle and scattered pelvic muscles are noted Review of the MIP images confirms the above findings. IMPRESSION: No evidence of aortic dissection or aneurysm No evidence of pulmonary emboli. Fatty hypertrophy of the interatrial septum. Fatty infiltration of the liver. 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 Chronic changes in the abdomen as described. Electronically Signed   By: Alcide Clever M.D.   On: 06/19/2016 17:09    ASSESSMENT AND PLAN:     1. Chest pain  - Has both typical and atypical components. Patient has a chronic history of hiatal hernia with GERD and fibromyalgia. Her chest pain is reproducible with palpation however, palpation is different filling from chest pressure. - Chest pain resolved with sublingual nitroglycerin x1 this morning. New EKG  changes. - Will schedule her for [facility] today.  2. Hypertension - Elevated at times. Continue current regimen.  Otherwise per primary  Active Problems:   Diabetes mellitus due to underlying condition (HCC)   Anxiety state   Essential hypertension   Asthma   Fibromyalgia   CHEST PAIN-PRECORDIAL   GAD (generalized anxiety disorder)   Chest pain   Signed: Bhagat,Bhavinkumar, PA 06/20/2016, 11:22 AM Pager [phone]  Co-Sign MD

## 2016-06-20 NOTE — Progress Notes (Signed)
lexiscan myoview stress portion, without complications but + SOB and increase of her chest pain.  IV aminophylline given and then 2 mg IV morphine, she did no want NTG.

## 2016-06-20 NOTE — Progress Notes (Signed)
MD aware of pt EKG and chest pain characteristics. MD stated today's goal is a stress test and she consult cardiology. MD stated to encourage pt to try one nitroglycerin. Pt agreeable. Will continue to monitor  Maeve Debord

## 2016-06-20 NOTE — Progress Notes (Signed)
Patient have pain on her left upper chest states it is a 5/10. Describes it as tightness and steady pain. Pt refusing nitroglycerin at this time. Completed bedside EKG. Awaiting MD call back  Anne Bryant

## 2016-06-20 NOTE — Progress Notes (Signed)
Paged MD regarding diet order. Stress test completed, awaiting results   Lauralynn Loeb Anne Bryant

## 2016-06-20 NOTE — Progress Notes (Signed)
Paged MD regarding pt NPO order. MD stated okay to give all medications with sips of water  Anne Bryant

## 2016-06-20 NOTE — Progress Notes (Signed)
nuc study is low risk, Dr. Katrinka Blazing felt from out standpoint she could be discharged.

## 2016-06-21 NOTE — Discharge Summary (Addendum)
Physician Discharge Summary  Anne Bryant PXT:[id number] DOB: 1946/04/15 DOA: 06/19/2016  PCP: Etta Quill, PA-C  Admit date: 06/19/2016 Discharge date: 06/20/2016  Time spent: 45 minutes  Recommendations for Outpatient Follow-up:  -PCP in 1 week  Discharge Diagnoses:    Atypical chest pain   Diabetes mellitus due to underlying condition [facility])   Anxiety state   Essential hypertension   Asthma   Fibromyalgia   CHEST PAIN-PRECORDIAL   GAD (generalized anxiety disorder)  Discharge Condition: stable  Diet recommendation: DM/heart healthy  Filed Weights   06/19/16 2042 06/20/16 0657  Weight: 86.5 kg (190 lb 11.2 oz) 85.6 kg (188 lb 11.2 oz)    History of present illness:   Anne Bryant is a 71 y.o. female with a history of Hypertension, fibromyalgia, hiatal hernia, GERD, diabetes and OSA (nontolerant of CPAP) just for evaluation of chest pain. Friday night patient woke up withsubsternal chest pressure radiating to her left arm. She questioned that she woke up from chest patient or  going to bathroom. Since then patient has a intermittent chest pressure lasting up to 1 hour .  Hospital Course:  1. Atypical chest pain with some typical features -EKG without acute changes, troponin 3 negative, CTA chest and negative for PE or dissection- -Due to numerous cardiac risk factors namely diabetes, hypertension, dyslipidemia etc she was admitted overnight, seen by cardiology and underwent Myoview this afternoon which was negative for inducible ischemia and low risk study, discharged home in a stable condition to FU with PCP, suspect this may be muscular vs GI in origin, advised pt to take PPI BID for 1 week and then daily  2. Diabetes mellitus due to underlying condition (HCC) -resumed glipizide  3. Hypertension -Resumed amlodipine  4. Anxiety/depression -resumed Xanax and antidepressants  Procedures:  Myoview  Consultations:  Cardiology  Discharge  Exam: Vitals:   06/20/16 1321 06/20/16 1323  BP:    Pulse: 85 85  Resp:    Temp:      General: AAOx3 Cardiovascular: S1S2/RRR Respiratory: CTAB  Discharge Instructions    Discharge Medication List as of 06/20/2016  4:40 PM    CONTINUE these medications which have NOT CHANGED   Details  amitriptyline (ELAVIL) 25 MG tablet Take 50 mg by mouth at bedtime., Starting Thu 05/05/2016, Historical Med    amLODipine (NORVASC) 2.5 MG tablet Take 1 tablet (2.5 mg total) by mouth daily., Starting Tue 06/04/2013, Normal    atorvastatin (LIPITOR) 10 MG tablet Take 10 mg by mouth daily at 6 PM., Historical Med    cetirizine (ZYRTEC) 10 MG tablet Take 10 mg by mouth every evening., Historical Med    dicyclomine (BENTYL) 20 MG tablet Take 20 mg by mouth 2 (two) times daily as needed for spasms. , Starting Thu 05/05/2016, Historical Med    DULoxetine (CYMBALTA) 30 MG capsule Take 30 mg by mouth daily., Historical Med    furosemide (LASIX) 20 MG tablet Take 20 mg by mouth as needed for fluid or edema. , Starting Thu 01/17/2013, Historical Med    GLIPIZIDE XL 5 MG 24 hr tablet Take 5 mg by mouth daily before breakfast., Starting Mon 03/14/2016, Historical Med    metoprolol succinate (TOPROL-XL) 25 MG 24 hr tablet Take 25 mg by mouth daily., Historical Med    oxyCODONE-acetaminophen (PERCOCET/ROXICET) 5-325 MG tablet Take 1 tablet by mouth every 8 (eight) hours as needed for severe pain., Historical Med    pantoprazole (PROTONIX) 40 MG tablet Take 40 mg by mouth  2 (two) times daily., Historical Med    sucralfate (CARAFATE) 1 GM/10ML suspension Take 10 mLs by mouth daily as needed for irritation., Starting Tue 02/09/2016, Historical Med    topiramate (TOPAMAX) 25 MG tablet Take 100 mg by mouth at bedtime. , Starting Fri 02/08/2013, Historical Med    VENTOLIN HFA 108 (90 BASE) MCG/ACT inhaler Inhale 2 puffs into the lungs every 6 (six) hours as needed for shortness of breath. , Starting Tue  03/05/2013, Historical Med    Vitamin D, Ergocalciferol, (DRISDOL) 50000 units CAPS capsule Take 50,000 Units by mouth every Saturday., Starting Mon 03/23/2015, Historical Med      STOP taking these medications     diclofenac (VOLTAREN) 50 MG EC tablet      predniSONE (DELTASONE) 5 MG tablet      omeprazole (PRILOSEC) 20 MG capsule        Allergies  Allergen Reactions  . Escitalopram Oxalate Other (See Comments)    Confusion   . Azithromycin Rash and Other (See Comments)    REACTION: makes pt feel funny  . Clarithromycin Nausea And Vomiting  . Doxycycline Diarrhea and Other (See Comments)    Stomach issues  . Fluoxetine Other (See Comments)    Irritability, anger  . Morphine And Related Other (See Comments)    headaches  . Pregabalin Other (See Comments)    REACTION: dizzy, weakness   . Sertraline Hcl Other (See Comments)    DIDN'T WORK FOR PATIENT  . Tetanus Toxoid Adsorbed Other (See Comments)   Follow-up Information    BRYAN,DANIEL J, PA-C. Schedule an appointment as soon as possible for a visit on 06/29/2016.   Specialty:  Family Medicine Why:  @ 1220 for hospital follow up, office will call if earlier date becomes available Contact information: [address] [place] [address] [phone]            The results of significant diagnostics from this hospitalization (including imaging, microbiology, ancillary and laboratory) are listed below for reference.    Significant Diagnostic Studies: Nm Myocar Multi W/spect W/wall Motion / Ef  Addendum Date: 06/20/2016    The study is normal.  This is a low risk study.  LVEF 74%    Result Date: 06/20/2016  T wave inversion was noted during stress, beginning at 1 minutes of stress, ending at 3 minutes of stress.  There was no ST segment deviation noted during stress.  The study is normal.  This is a low risk study.  The left ventricular ejection fraction is hyperdynamic (>65%).    Ct  Angio Chest/abd/pel For Dissection W And/or W/wo  Result Date: 06/19/2016 CLINICAL DATA:  Chest pain and tightness for 2 days, initial encounter EXAM: CT ANGIOGRAPHY CHEST, ABDOMEN AND PELVIS TECHNIQUE: Multidetector CT imaging through the chest, abdomen and pelvis was performed using the standard protocol during bolus administration of intravenous contrast. Multiplanar reconstructed images and MIPs were obtained and reviewed to evaluate the vascular anatomy. CONTRAST:  100 mL of Isovue 370. COMPARISON:  None. FINDINGS: CTA CHEST FINDINGS Cardiovascular: Thoracic aorta demonstrates mild atherosclerotic calcifications although no aneurysmal dilatation or dissection is seen. The brachiocephalic vessels are within normal limits. The pulmonary artery shows a normal branching pattern without evidence of pulmonary emboli. There is evidence of a lipomatous hypertrophy of the intra-atrial septum. It exerts some mass effect upon the right atrium as well as the IVC as it enters the right atrium. Mediastinum/Nodes: 7 mm right peritracheal lymph node is noted. No  other mediastinal or hilar adenopathy is seen. The thoracic inlet is within normal limits. Lungs/Pleura: The lungs are well aerated bilaterally. Patchy left lower lobe infiltrate is seen. Some dependent atelectatic changes are noted as well. In the medial aspect of the the left upper lobe best seen on image number 44 of series 9 there is a small 1 cm nodule identified. On the soft tissue windows this demonstrates some fat within and is consistent with a hamartoma. Musculoskeletal: Mild degenerative changes of the thoracic spine are seen. Review of the MIP images confirms the above findings. CTA ABDOMEN AND PELVIS FINDINGS VASCULAR Aorta: Widely patent without aneurysmal dilatation or dissection. Celiac: Widely patent SMA: Widely patent Renals: Patent single renal arteries are noted bilaterally. IMA: Widely patent Iliacs: Widely patent Veins: Within normal limits.  Review of the MIP images confirms the above findings. NON-VASCULAR Hepatobiliary: The liver is diffusely decreased in attenuation consistent with fatty infiltration. The gallbladder has been surgically removed. Pancreas: Unremarkable. No pancreatic ductal dilatation or surrounding inflammatory changes. Spleen: Normal in size without focal abnormality. Adrenals/Urinary Tract: Adrenal glands are unremarkable. Kidneys are normal, without renal calculi, focal lesion, or hydronephrosis. Bladder is unremarkable. Stomach/Bowel: Scattered diverticular change of the colon is seen without definitive diverticulitis. No obstructive changes or inflammatory changes are seen in the bowel. The appendix is not well visualized although no inflammatory changes are seen. Lymphatic: No significant lymphadenopathy is noted. Reproductive: Uterus and bilateral adnexa are unremarkable. Other: No abdominal wall hernia or abnormality. No abdominopelvic ascites. Musculoskeletal: Changes of prior left hip replacement are seen. Some fatty replacement of the left psoas muscle and scattered pelvic muscles are noted Review of the MIP images confirms the above findings. IMPRESSION: No evidence of aortic dissection or aneurysm No evidence of pulmonary emboli. Fatty hypertrophy of the interatrial septum. Fatty infiltration of the liver. 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 Chronic changes in the abdomen as described. Electronically Signed   By: Alcide Clever M.D.   On: 06/19/2016 17:09    Microbiology: No results found for this or any previous visit (from the past 240 hour(s)).   Labs: Basic Metabolic Panel:  Recent Labs Lab 06/19/16 1449 06/19/16 2110 06/20/16 0200  NA 138  --  137  K 3.7  --  3.9  CL 101  --  99*  CO2 27  --  28  GLUCOSE 236*  --  278*  BUN 5*  --  10  CREATININE 0.76 0.90 0.95  CALCIUM 9.4  --  9.3   Liver Function Tests: No results for input(s): AST, ALT, ALKPHOS,  BILITOT, PROT, ALBUMIN in the last 168 hours. No results for input(s): LIPASE, AMYLASE in the last 168 hours. No results for input(s): AMMONIA in the last 168 hours. CBC:  Recent Labs Lab 06/19/16 1449 06/19/16 2110 06/20/16 0200  WBC 7.8 9.4 9.2  NEUTROABS 5.2  --   --   HGB 12.9 12.2 12.1  HCT 40.7 39.0 38.5  MCV 65.6* 66.1* 66.5*  PLT 175 198 183   Cardiac Enzymes:  Recent Labs Lab 06/19/16 2110 06/20/16 0200 06/20/16 0907  TROPONINI <0.03 <0.03 <0.03   BNP: BNP (last 3 results) No results for input(s): BNP in the last 8760 hours.  ProBNP (last 3 results) No results for input(s): PROBNP in the last 8760 hours.  CBG:  Recent Labs Lab 06/19/16 2129 06/20/16 0747 06/20/16 1137 06/20/16 1630  GLUCAP 251* 221* 194* 231*  SignedZannie Cove MD.  [facility] 06/21/2016, 1:26 PM

## 2016-06-30 ENCOUNTER — Telehealth: Payer: Self-pay | Admitting: *Deleted

## 2016-06-30 NOTE — Telephone Encounter (Signed)
Encounter created to enter EKG order and result not entered on DOS.

## 2016-10-11 ENCOUNTER — Other Ambulatory Visit (HOSPITAL_COMMUNITY): Payer: Self-pay | Admitting: Orthopedic Surgery

## 2016-10-11 DIAGNOSIS — M25551 Pain in right hip: Secondary | ICD-10-CM

## 2016-10-14 ENCOUNTER — Encounter (HOSPITAL_COMMUNITY): Payer: Medicare Other

## 2016-10-14 ENCOUNTER — Encounter (HOSPITAL_COMMUNITY): Payer: Self-pay

## 2017-10-07 HISTORY — PX: ESOPHAGEAL DILATION: SHX303

## 2018-07-08 DIAGNOSIS — J189 Pneumonia, unspecified organism: Secondary | ICD-10-CM

## 2018-07-08 HISTORY — DX: Pneumonia, unspecified organism: J18.9

## 2018-07-10 ENCOUNTER — Inpatient Hospital Stay: Admit: 2018-07-10 | Payer: Medicare Other | Admitting: Orthopedic Surgery

## 2018-07-10 SURGERY — ARTHROPLASTY, HIP, TOTAL, ANTERIOR APPROACH
Anesthesia: Spinal | Laterality: Right

## 2018-11-29 NOTE — H&P (Signed)
TOTAL HIP ADMISSION H&P  Patient is admitted for right total hip arthroplasty, anterior approach.  Subjective:  Chief Complaint: Right hip primary OA / pain  HPI: Anne Bryant, 73 y.o. female, has a history of pain and functional disability in the right hip(s) due to arthritis and patient has failed non-surgical conservative treatments for greater than 12 weeks to include NSAID's and/or analgesics, corticosteriod injections, use of assistive devices and activity modification.  Onset of symptoms was gradual starting 2+ years ago with gradually worsening course since that time.The patient noted no past surgery on the right hip(s).  Patient currently rates pain in the right hip at 10 out of 10 with activity. Patient has night pain, worsening of pain with activity and weight bearing, trendelenberg gait, pain that interfers with activities of daily living and pain with passive range of motion. Patient has evidence of periarticular osteophytes and joint space narrowing by imaging studies. This condition presents safety issues increasing the risk of falls.   There is no current active infection.1   Risks, benefits and expectations were discussed with the patient.  Risks including but not limited to the risk of anesthesia, blood clots, nerve damage, blood vessel damage, failure of the prosthesis, infection and up to and including death.  Patient understand the risks, benefits and expectations and wishes to proceed with surgery.   PCP: Fransisca Connors, PA-C  D/C Plans:       Home   Post-op Meds:       No Rx given  Tranexamic Acid:      To be given - IV   Decadron:      Is to be given  FYI:      ASA  Oxycodone 10 bid-tid before surgery  DME:   Pt already has equipment   PT:   [facility]  Ph[facility]: [facility]    Patient Active Problem List   Diagnosis Date Noted  . Chest pain 06/19/2016  . Major depressive disorder, recurrent episode, severe, without mention of psychotic behavior  02/15/2013  . GAD (generalized anxiety disorder) 02/15/2013  . DYSPNEA 06/23/2009  . CHEST PAIN-PRECORDIAL 06/23/2009  . Diabetes mellitus due to underlying condition ([place]) 06/19/2009  . HYPERLIPIDEMIA 06/19/2009  . LEUKOCYTOSIS 06/19/2009  . Anxiety state 06/19/2009  . PANIC ATTACK 06/19/2009  . DEPRESSION 06/19/2009  . MIGRAINE HEADACHE 06/19/2009  . Essential hypertension 06/19/2009  . Asthma 06/19/2009  . RHEUMATOID ARTHRITIS 06/19/2009  . OSTEOARTHRITIS 06/19/2009  . Fibromyalgia 06/19/2009  . LEG EDEMA 06/19/2009  . EPISTAXIS 06/19/2009  . DYSPNEA ON EXERTION 06/19/2009  . IMPAIRED FASTING GLUCOSE 06/19/2009   Past Medical History:  Diagnosis Date  . Arthritis   . Asthma   . Diabetes mellitus without complication ([place])   . Diverticulitis   . Fibromyalgia 2006  . GERD (gastroesophageal reflux disease)   . Hypertension   . Migraine     Past Surgical History:  Procedure Laterality Date  . CHOLECYSTECTOMY    . JOINT REPLACEMENT      No current facility-administered medications for this encounter.    Current Outpatient Medications  Medication Sig Dispense Refill Last Dose  . amitriptyline (ELAVIL) 25 MG tablet Take 50 mg by mouth at bedtime.   06/18/2016 at Unknown time  . amLODipine (NORVASC) 2.5 MG tablet Take 1 tablet (2.5 mg total) by mouth daily. 30 tablet 6 06/18/2016 at Unknown time  . atorvastatin (LIPITOR) 10 MG tablet Take 10 mg by mouth daily at 6 PM.   06/18/2016 at  Unknown time  . cetirizine (ZYRTEC) 10 MG tablet Take 10 mg by mouth every evening.   06/18/2016 at Unknown time  . dicyclomine (BENTYL) 20 MG tablet Take 20 mg by mouth 2 (two) times daily as needed for spasms.    06/18/2016 at Unknown time  . DULoxetine (CYMBALTA) 30 MG capsule Take 30 mg by mouth daily.   06/18/2016 at Unknown time  . furosemide (LASIX) 20 MG tablet Take 20 mg by mouth as needed for fluid or edema.    Past Month at Unknown time  . GLIPIZIDE XL 5 MG 24 hr tablet Take 5 mg by mouth  daily before breakfast.   06/18/2016 at Unknown time  . metoprolol succinate (TOPROL-XL) 25 MG 24 hr tablet Take 25 mg by mouth daily.   06/18/2016 at 0100  . oxyCODONE-acetaminophen (PERCOCET/ROXICET) 5-325 MG tablet Take 1 tablet by mouth every 8 (eight) hours as needed for severe pain.   Past Week at Unknown time  . pantoprazole (PROTONIX) 40 MG tablet Take 40 mg by mouth 2 (two) times daily.   06/18/2016 at Unknown time  . sucralfate (CARAFATE) 1 GM/10ML suspension Take 10 mLs by mouth daily as needed for irritation.   06/18/2016 at Unknown time  . topiramate (TOPAMAX) 25 MG tablet Take 100 mg by mouth at bedtime.    06/18/2016 at Unknown time  . VENTOLIN HFA 108 (90 BASE) MCG/ACT inhaler Inhale 2 puffs into the lungs every 6 (six) hours as needed for shortness of breath.    06/18/2016 at Unknown time  . Vitamin D, Ergocalciferol, (DRISDOL) 50000 units CAPS capsule Take 50,000 Units by mouth every Saturday.   Past Week at Unknown time   Allergies  Allergen Reactions  . Escitalopram Oxalate Other (See Comments)    Confusion   . Azithromycin Rash and Other (See Comments)    REACTION: makes pt feel funny  . Clarithromycin Nausea And Vomiting  . Doxycycline Diarrhea and Other (See Comments)    Stomach issues  . Fluoxetine Other (See Comments)    Irritability, anger  . Morphine And Related Other (See Comments)    headaches  . Pregabalin Other (See Comments)    REACTION: dizzy, weakness   . Sertraline Hcl Other (See Comments)    DIDN'T WORK FOR PATIENT  . Tetanus Toxoid Adsorbed Other (See Comments)    Social History   Tobacco Use  . Smoking status: Former Smoker    Types: Cigarettes  . Smokeless tobacco: Never Used  . Tobacco comment: irregular use for months.  Substance Use Topics  . Alcohol use: No    Comment: Patient stopped.    Family History  Problem Relation Age of Onset  . Depression Mother   . Anxiety disorder Mother   . Bipolar disorder Neg Hx   . Schizophrenia Neg Hx    . Alcohol abuse Neg Hx   . Drug abuse Neg Hx   . OCD Neg Hx      Review of Systems  Constitutional: Negative.   HENT: Negative.   Eyes: Negative.   Respiratory: Negative.   Cardiovascular: Negative.   Gastrointestinal: Positive for heartburn.  Genitourinary: Positive for frequency.  Musculoskeletal: Positive for joint pain.  Skin: Negative.   Neurological: Positive for headaches.  Endo/Heme/Allergies: Negative.   Psychiatric/Behavioral: Positive for depression. The patient is nervous/anxious.     Objective:  Physical Exam  Constitutional: She is oriented to person, place, and time. She appears well-developed.  HENT:  Head: Normocephalic.  Eyes: Pupils are  equal, round, and reactive to light.  Neck: Neck supple. No JVD present. No tracheal deviation present. No thyromegaly present.  Cardiovascular: Normal rate, regular rhythm and intact distal pulses.  Respiratory: Effort normal and breath sounds normal. No respiratory distress. She has no wheezes.  GI: Soft. There is no abdominal tenderness. There is no guarding.  Musculoskeletal:     Right hip: She exhibits decreased range of motion, decreased strength, tenderness and bony tenderness. She exhibits no swelling, no deformity and no laceration.  Lymphadenopathy:    She has no cervical adenopathy.  Neurological: She is alert and oriented to person, place, and time. A sensory deficit (mild neuropathy in bialteral feet) is present.  Skin: Skin is warm and dry.  Psychiatric: She has a normal mood and affect.     Labs:  Estimated body mass index is 35.08 kg/m as calculated from the following:   Height as of 06/19/16: 5' 1.5" (1.562 m).   Weight as of 06/20/16: 85.6 kg.   Imaging Review Plain radiographs demonstrate severe degenerative joint disease of the right hip. The bone quality appears to be good for age and reported activity level.      Assessment/Plan:  End stage arthritis, right hip  The patient history,  physical examination, clinical judgement of the provider and imaging studies are consistent with end stage degenerative joint disease of the right hip and total hip arthroplasty is deemed medically necessary. The treatment options including medical management, injection therapy, arthroscopy and arthroplasty were discussed at length. The risks and benefits of total hip arthroplasty were presented and reviewed. The risks due to aseptic loosening, infection, stiffness, dislocation/subluxation,  thromboembolic complications and other imponderables were discussed.  The patient acknowledged the explanation, agreed to proceed with the plan and consent was signed. Patient is being admitted for inpatient treatment for surgery, pain control, PT, OT, prophylactic antibiotics, VTE prophylaxis, progressive ambulation and ADL's and discharge planning.The patient is planning to be discharged home.     Anastasio Auerbach Else Habermann   PA-C  11/29/2018, 9:27 AM

## 2018-12-07 ENCOUNTER — Other Ambulatory Visit (HOSPITAL_COMMUNITY)
Admission: RE | Admit: 2018-12-07 | Discharge: 2018-12-07 | Disposition: A | Discharge status: Home / Self Care | Payer: Medicare Other | Source: Ambulatory Visit | Attending: Obstetrics & Gynecology | Admitting: Obstetrics & Gynecology

## 2018-12-07 DIAGNOSIS — Z20828 Contact with and (suspected) exposure to other viral communicable diseases: Secondary | ICD-10-CM | POA: Diagnosis not present

## 2018-12-07 DIAGNOSIS — Z01812 Encounter for preprocedural laboratory examination: Secondary | ICD-10-CM | POA: Insufficient documentation

## 2018-12-07 LAB — SARS CORONAVIRUS 2 (TAT 6-24 HRS): SARS Coronavirus 2: NEGATIVE

## 2018-12-10 ENCOUNTER — Encounter (HOSPITAL_COMMUNITY)
Admission: RE | Admit: 2018-12-10 | Discharge: 2018-12-10 | Disposition: A | Discharge status: Home / Self Care | Payer: Medicare Other | Source: Ambulatory Visit | Attending: Orthopedic Surgery | Admitting: Orthopedic Surgery

## 2018-12-10 ENCOUNTER — Other Ambulatory Visit: Payer: Self-pay

## 2018-12-10 ENCOUNTER — Encounter (HOSPITAL_COMMUNITY): Payer: Self-pay

## 2018-12-10 HISTORY — DX: Dyspnea, unspecified: R06.00

## 2018-12-10 HISTORY — DX: Panic disorder (episodic paroxysmal anxiety): F41.0

## 2018-12-10 HISTORY — DX: Sleep apnea, unspecified: G47.30

## 2018-12-10 HISTORY — DX: Hypertrophy of tonsils: J35.1

## 2018-12-10 HISTORY — DX: Cardiac arrhythmia, unspecified: I49.9

## 2018-12-10 HISTORY — DX: Personal history of other medical treatment: Z92.89

## 2018-12-10 HISTORY — DX: Disorder of kidney and ureter, unspecified: N28.9

## 2018-12-10 HISTORY — DX: Barrett's esophagus without dysplasia: K22.70

## 2018-12-10 HISTORY — DX: Other complications of anesthesia, initial encounter: T88.59XA

## 2018-12-10 HISTORY — DX: Other specified respiratory disorders: J98.8

## 2018-12-10 LAB — CBC
HCT: 42.8 % (ref 36.0–46.0)
Hemoglobin: 12.9 g/dL (ref 12.0–15.0)
MCH: 21.4 pg — ABNORMAL LOW (ref 26.0–34.0)
MCHC: 30.1 g/dL (ref 30.0–36.0)
MCV: 71.1 fL — ABNORMAL LOW (ref 80.0–100.0)
Platelets: 214 10*3/uL (ref 150–400)
RBC: 6.02 MIL/uL — ABNORMAL HIGH (ref 3.87–5.11)
RDW: 16.8 % — ABNORMAL HIGH (ref 11.5–15.5)
WBC: 9.7 10*3/uL (ref 4.0–10.5)
nRBC: 0 % (ref 0.0–0.2)

## 2018-12-10 LAB — BASIC METABOLIC PANEL
Anion gap: 10 (ref 5–15)
BUN: 12 mg/dL (ref 8–23)
CO2: 27 mmol/L (ref 22–32)
Calcium: 9.5 mg/dL (ref 8.9–10.3)
Chloride: 104 mmol/L (ref 98–111)
Creatinine, Ser: 0.71 mg/dL (ref 0.44–1.00)
GFR calc Af Amer: >60 mL/min (ref >60)
GFR calc non Af Amer: >60 mL/min (ref >60)
Glucose, Bld: 158 mg/dL — ABNORMAL HIGH (ref 70–99)
Potassium: 4.5 mmol/L (ref 3.5–5.1)
Sodium: 141 mmol/L (ref 135–145)

## 2018-12-10 LAB — SURGICAL PCR SCREEN
MRSA, PCR: NEGATIVE
Staphylococcus aureus: NEGATIVE

## 2018-12-10 LAB — GLUCOSE, CAPILLARY: Glucose-Capillary: 172 mg/dL — ABNORMAL HIGH (ref 70–99)

## 2018-12-10 LAB — HEMOGLOBIN A1C
Hgb A1c MFr Bld: 7.5 % — ABNORMAL HIGH (ref 4.8–5.6)
Mean Plasma Glucose: 168.55 mg/dL

## 2018-12-10 NOTE — Progress Notes (Signed)
CARDIAC CLEARANCE/ LOV CARDIOLOGY DR Shaune Pollack. [facility] 11-14-2018 ON CHART   LOV PULMONOLOGY DR [facility] 11-29-2018 Epic CARE EVERYWHERE   CXR 07-21-2018 [place] , ON CHART   ECHO/STRESS TEST [place], 07-03-2018 ON CHART   30 DAY EVENT MONITOR Epic CARE   EVERYWHERE 09-2018

## 2018-12-10 NOTE — Patient Instructions (Addendum)
YOU HAVE COMPLETED YOUR COVID-19 TEST PLEASE BEGIN THE QUARANTINE INSTRUCTIONS AS OUTLINED IN YOUR HANDOUT.                Anne Bryant    Your procedure is scheduled on: 12-11-2018   [facility]    Report to admitting at 6:00AM   1 VISITOR IS ALLOWED TO WAIT IN WAITING ROOM  ONLY DAY OF YOUR SURGERY.    Call this number if you have problems the morning of surgery [phone]   PLEASE BRING BIPAP MASK AND  TUBING ONLY. DEVICE WILL BE PROVIDED!   Remember: BRUSH YOUR TEETH MORNING OF SURGERY AND RINSE YOUR MOUTH OUT, NO CHEWING GUM CANDY OR MINTS.  Do not eat food After Midnight. YOU MAY HAVE CLEAR LIQUIDS FROM MIDNIGHT UNTIL 4:30AM. At 4:30AM Please finish the prescribed Pre-Surgery Gatorade drink. Nothing by mouth after you finish the Gatorade drink !   CLEAR LIQUID DIET   Foods Allowed                                                                     Foods Excluded  Coffee and tea, regular and decaf                             liquids that you cannot  Plain Jell-O any favor except red or purple                                           see through such as: Fruit ices (not with fruit pulp)                                     milk, soups, orange juice  Iced Popsicles                                    All solid food Carbonated beverages, regular and diet                                    Cranberry, grape and apple juices Sports drinks like Gatorade Lightly seasoned clear broth or consume(fat free) Sugar, honey syrup  Sample Menu Breakfast                                Lunch                                     Supper Cranberry juice                    Beef broth  Chicken broth Jell-O                                     Grape juice                           Apple juice Coffee or tea                        Jell-O                                      Popsicle                                                 Coffee or tea                        Coffee or tea  _____________________________________________________________________       Take these medicines the morning of surgery with A SIP OF WATER: DULOXETINE, PREDNISONE, PANTOPRAZOLE, OXYCODONE IF NEEDED, SYMBICORT INHALER, VENTOLIN INHALER IF NEEDED (PLEASE BRING)    DIABETES INSTRUCTIONS TAKE GLIPIZIDE AS NORMAL THE DAY BEFORE SURGERY DO NOT TAKE ANY DIABETES MEDICATION THE DAY OF SURGERY! PLEASE CHECK YOUR BLOOD SUGAR THE DAY OF SURGERY. REPORT TO YOUR NURSE ON ARRIVAL.  IF YOUR BLOOD SUGAR IS LESS THAN 70 THE MORNING SURGERY WHEN YOU CHECK IT AT HOME, DRINK HALF A GLASS OF APPLE JUICE OR TAKE GLUCOSE TABLETS AND RECHECK YOUR BLOOD SUGAR AFTER 15 MINUTES. IF IT IS STILL LESS THAN 70 AFTER TREATMENT, YOU NEED TO CALL THE [phone] FOR FURTHER INSTRUCTIONS!                                 You may not have any metal on your body including hair pins and              piercings  Do not wear jewelry, make-up, lotions, powders or perfumes, deodorant             Do not wear nail polish.  Do not shave  48 hours prior to surgery.              Men may shave face and neck.   Do not bring valuables to the hospital. [facility] IS NOT             RESPONSIBLE   FOR VALUABLES.  Contacts, dentures or bridgework may not be worn into surgery.                Please read over the following fact sheets you were given: _____________________________________________________________________             [facility] - Preparing for Surgery Before surgery, you can play an important role.  Because skin is not sterile, your skin needs to be as free of germs as possible.  You can reduce the number of germs on your skin by washing with CHG (chlorahexidine gluconate) soap before surgery.  CHG is an antiseptic cleaner which kills germs and bonds with the skin to  continue killing germs even after washing. Please DO NOT use if you have an allergy to CHG  or antibacterial soaps.  If your skin becomes reddened/irritated stop using the CHG and inform your nurse when you arrive at Short Stay. Do not shave (including legs and underarms) for at least 48 hours prior to the first CHG shower.  You may shave your face/neck. Please follow these instructions carefully:  1.  Shower with CHG Soap the night before surgery and the  morning of Surgery.  2.  If you choose to wash your hair, wash your hair first as usual with your  normal  shampoo.  3.  After you shampoo, rinse your hair and body thoroughly to remove the  shampoo.                           4.  Use CHG as you would any other liquid soap.  You can apply chg directly  to the skin and wash                       Gently with a scrungie or clean washcloth.  5.  Apply the CHG Soap to your body ONLY FROM THE NECK DOWN.   Do not use on face/ open                           Wound or open sores. Avoid contact with eyes, ears mouth and genitals (private parts).                       Wash face,  Genitals (private parts) with your normal soap.             6.  Wash thoroughly, paying special attention to the area where your surgery  will be performed.  7.  Thoroughly rinse your body with warm water from the neck down.  8.  DO NOT shower/wash with your normal soap after using and rinsing off  the CHG Soap.                9.  Pat yourself dry with a clean towel.            10.  Wear clean pajamas.            11.  Place clean sheets on your bed the night of your first shower and do not  sleep with pets. Day of Surgery : Do not apply any lotions/deodorants the morning of surgery.  Please wear clean clothes to the hospital/surgery center.  FAILURE TO FOLLOW THESE INSTRUCTIONS MAY RESULT IN THE CANCELLATION OF YOUR SURGERY PATIENT SIGNATURE_________________________________  NURSE SIGNATURE__________________________________  ________________________________________________________________________   Anne Bryant  An incentive spirometer is a tool that can help keep your lungs clear and active. This tool measures how well you are filling your lungs with each breath. Taking long deep breaths may help reverse or decrease the chance of developing breathing (pulmonary) problems (especially infection) following:  A long period of time when you are unable to move or be active. BEFORE THE PROCEDURE   If the spirometer includes an indicator to show your best effort, your nurse or respiratory therapist will set it to a desired goal.  If possible, sit up straight or lean slightly forward. Try not to slouch.  Hold the incentive spirometer in an upright position. INSTRUCTIONS FOR USE  1.  Sit on the edge of your bed if possible, or sit up as far as you can in bed or on a chair. 2. Hold the incentive spirometer in an upright position. 3. Breathe out normally. 4. Place the mouthpiece in your mouth and seal your lips tightly around it. 5. Breathe in slowly and as deeply as possible, raising the piston or the ball toward the top of the column. 6. Hold your breath for 3-5 seconds or for as long as possible. Allow the piston or ball to fall to the bottom of the column. 7. Remove the mouthpiece from your mouth and breathe out normally. 8. Rest for a few seconds and repeat Steps 1 through 7 at least 10 times every 1-2 hours when you are awake. Take your time and take a few normal breaths between deep breaths. 9. The spirometer may include an indicator to show your best effort. Use the indicator as a goal to work toward during each repetition. 10. After each set of 10 deep breaths, practice coughing to be sure your lungs are clear. If you have an incision (the cut made at the time of surgery), support your incision when coughing by placing a pillow or rolled up towels firmly against it. Once you are able to get out of bed, walk around indoors and cough well. You may stop using the incentive spirometer when  instructed by your caregiver.  RISKS AND COMPLICATIONS  Take your time so you do not get dizzy or light-headed.  If you are in pain, you may need to take or ask for pain medication before doing incentive spirometry. It is harder to take a deep breath if you are having pain. AFTER USE  Rest and breathe slowly and easily.  It can be helpful to keep track of a log of your progress. Your caregiver can provide you with a simple table to help with this. If you are using the spirometer at home, follow these instructions: [place] IF:   You are having difficultly using the spirometer.  You have trouble using the spirometer as often as instructed.  Your pain medication is not giving enough relief while using the spirometer.  You develop fever of 100.5 F (38.1 C) or higher. SEEK IMMEDIATE MEDICAL CARE IF:   You cough up bloody sputum that had not been present before.  You develop fever of 102 F (38.9 C) or greater.  You develop worsening pain at or near the incision site. MAKE SURE YOU:   Understand these instructions.  Will watch your condition.  Will get help right away if you are not doing well or get worse. Document Released: 09/05/2006 Document Revised: 07/18/2011 Document Reviewed: 11/06/2006 ExitCare Patient Information 2014 ExitCare, [place].   ________________________________________________________________________  WHAT IS A BLOOD TRANSFUSION? Blood Transfusion Information  A transfusion is the replacement of blood or some of its parts. Blood is made up of multiple cells which provide different functions.  Red blood cells carry oxygen and are used for blood loss replacement.  White blood cells fight against infection.  Platelets control bleeding.  Plasma helps clot blood.  Other blood products are available for specialized needs, such as hemophilia or other clotting disorders. BEFORE THE TRANSFUSION  Who gives blood for transfusions?   Healthy  volunteers who are fully evaluated to make sure their blood is safe. This is blood bank blood. Transfusion therapy is the safest it has ever been in the practice of medicine. Before blood is taken from a donor,  a complete history is taken to make sure that person has no history of diseases nor engages in risky social behavior (examples are intravenous drug use or sexual activity with multiple partners). The donor's travel history is screened to minimize risk of transmitting infections, such as malaria. The donated blood is tested for signs of infectious diseases, such as HIV and hepatitis. The blood is then tested to be sure it is compatible with you in order to minimize the chance of a transfusion reaction. If you or a relative donates blood, this is often done in anticipation of surgery and is not appropriate for emergency situations. It takes many days to process the donated blood. RISKS AND COMPLICATIONS Although transfusion therapy is very safe and saves many lives, the main dangers of transfusion include:   Getting an infectious disease.  Developing a transfusion reaction. This is an allergic reaction to something in the blood you were given. Every precaution is taken to prevent this. The decision to have a blood transfusion has been considered carefully by your caregiver before blood is given. Blood is not given unless the benefits outweigh the risks. AFTER THE TRANSFUSION  Right after receiving a blood transfusion, you will usually feel much better and more energetic. This is especially true if your red blood cells have gotten low (anemic). The transfusion raises the level of the red blood cells which carry oxygen, and this usually causes an energy increase.  The nurse administering the transfusion will monitor you carefully for complications. HOME CARE INSTRUCTIONS  No special instructions are needed after a transfusion. You may find your energy is better. Speak with your caregiver about any  limitations on activity for underlying diseases you may have. SEEK MEDICAL CARE IF:   Your condition is not improving after your transfusion.  You develop redness or irritation at the intravenous (IV) site. SEEK IMMEDIATE MEDICAL CARE IF:  Any of the following symptoms occur over the next 12 hours:  Shaking chills.  You have a temperature by mouth above 102 F (38.9 C), not controlled by medicine.  Chest, back, or muscle pain.  People around you feel you are not acting correctly or are confused.  Shortness of breath or difficulty breathing.  Dizziness and fainting.  You get a rash or develop hives.  You have a decrease in urine output.  Your urine turns a dark color or changes to pink, red, or brown. Any of the following symptoms occur over the next 10 days:  You have a temperature by mouth above 102 F (38.9 C), not controlled by medicine.  Shortness of breath.  Weakness after normal activity.  The white part of the eye turns yellow (jaundice).  You have a decrease in the amount of urine or are urinating less often.  Your urine turns a dark color or changes to pink, red, or brown. Document Released: 04/22/2000 Document Revised: 07/18/2011 Document Reviewed: 12/10/2007 [facility] Patient Information 2014 [place].  _______________________________________________________________________

## 2018-12-10 NOTE — Anesthesia Preprocedure Evaluation (Addendum)
Anesthesia Evaluation  Patient identified by MRN, date of birth, ID band Patient awake    Reviewed: Allergy & Precautions, H&P , NPO status , Patient's Chart, lab work & pertinent test results, reviewed documented beta blocker date and time   Airway Mallampati: III  TM Distance: >3 FB Neck ROM: Full  Mouth opening: Limited Mouth Opening  Dental no notable dental hx. (+) Edentulous Upper, Edentulous Lower, Dental Advisory Given   Pulmonary asthma , sleep apnea , former smoker,    Pulmonary exam normal breath sounds clear to auscultation       Cardiovascular Exercise Tolerance: Good hypertension, Pt. on medications and Pt. on home beta blockers  Rhythm:Regular Rate:Normal     Neuro/Psych  Headaches, Anxiety Depression    GI/Hepatic Neg liver ROS, GERD  Medicated and Controlled,  Endo/Other  diabetes, Type 2, Oral Hypoglycemic AgentsMorbid obesity  Renal/GU negative Renal ROS  negative genitourinary   Musculoskeletal  (+) Arthritis , Osteoarthritis,  Fibromyalgia -  Abdominal   Peds  Hematology negative hematology ROS (+)   Anesthesia Other Findings   Reproductive/Obstetrics negative OB ROS                            Anesthesia Physical Anesthesia Plan  ASA: III  Anesthesia Plan: Spinal   Post-op Pain Management:    Induction: Intravenous  PONV Risk Score and Plan: 3 and Ondansetron, Propofol infusion and Dexamethasone  Airway Management Planned: Simple Face Mask  Additional Equipment:   Intra-op Plan:   Post-operative Plan:   Informed Consent: I have reviewed the patients History and Physical, chart, labs and discussed the procedure including the risks, benefits and alternatives for the proposed anesthesia with the patient or authorized representative who has indicated his/her understanding and acceptance.     Dental advisory given  Plan Discussed with: CRNA  Anesthesia  Plan Comments:         Anesthesia Quick Evaluation

## 2018-12-10 NOTE — Progress Notes (Signed)
Anesthesia Chart Review   Case: [id number] Date/Time: 12/11/18 0825   Procedure: TOTAL HIP ARTHROPLASTY ANTERIOR APPROACH (Right )   Anesthesia type: Spinal   Pre-op diagnosis: Right hip osteoarthritis   Location: [facility] ROOM 10 / [facility]   Surgeon: Paralee Cancel, MD      DISCUSSION:73 y.o. former smoker with h/o HTN, asthma, sleep apnea on bipap, GERD, DM II, right hip OA scheduled for above procedure 12/11/2018 with Dr. Paralee Cancel.   Pt last seen by cardiologist, Dr. Clovis Bryant, 11/14/2018 for preoperative evaluation. Per his OV note, "She needs to undergo hip surgery.  I believe she can proceed with no further cardiac work-up given her recent normal stress test."  Anticipate pt can proceed with planned procedure barring acute status change.   VS: BP (!) 155/88 (BP Location: Right Arm)   Pulse 65   Temp 37 C (Oral)   Resp 18   Ht 5' 1.5" (1.562 Bryant)   Wt 86 kg   SpO2 95%   BMI 35.24 kg/Bryant   PROVIDERS: Anne Bryant, Anne M, NP is PCP   Anne Riley, MD is Cardiologist  LABS: labs pending (all labs ordered are listed, but only abnormal results are displayed)  Labs Reviewed  GLUCOSE, CAPILLARY - Abnormal; Notable for the following components:      Result Value   Glucose-Capillary 172 (*)    All other components within normal limits  SURGICAL PCR SCREEN  HEMOGLOBIN A1C  CBC  BASIC METABOLIC PANEL  TYPE AND SCREEN     IMAGES:   EKG: 12/10/2018 Rate 60 bpm Normal sinus rhythm  Minimal voltage criteria for LVH, may be normal variant Cannot rule out anterior infarct, age undetermined  CV:  Past Medical History:  Diagnosis Date  . Arthritis   . Asthma   . Barrett's esophagus    PERSONAL HISTORY PER PATIENT   . Complication of anesthesia   . Diabetes mellitus without complication (HCC)    TYPE 2 , MONITORS CBGS AT HOME   . Diverticulitis   . Dyspnea    pulmonogist [place]man   . Dysrhythmia   . Enlarged tonsils   . Fibromyalgia 2006  . GERD (gastroesophageal reflux  disease)   . History of blood transfusion   . Hypertension   . Migraine   . Narrowing of airway    per patient report   . Panic attack    per patient report  . Pneumonia 07/2018  . Renal insufficiency    per patient report, acute kidney injury when she had pneumonia march 2020 , reports back normal   . Sleep apnea    OSA WITH BIPAP     Past Surgical History:  Procedure Laterality Date  . CATARACTS  Bilateral   . CHOLECYSTECTOMY     WITH NAVEL HERNIA REPAIR   . ESOPHAGEAL DILATION  10/2017   SAME DAY DID COLONOSCOPY   . JOINT REPLACEMENT    . RETINAL DETACHMENT SURGERY Right   . REVISION TOTAL HIP ARTHROPLASTY Left    "THEY HAD TO GO BACK IN A FIX MY FEMUR"   . ROTATOR CUFF REPAIR Left 2004  . TOTAL HIP ARTHROPLASTY Left   . TOTAL KNEE ARTHROPLASTY Bilateral 2007, THEN 2012    MEDICATIONS: . amitriptyline (ELAVIL) 25 MG tablet  . atorvastatin (LIPITOR) 10 MG tablet  . budesonide-formoterol (SYMBICORT) 160-4.5 MCG/ACT inhaler  . cetirizine (ZYRTEC) 10 MG tablet  . Cholecalciferol (VITAMIN D3) 125 MCG (5000 UT) CAPS  . dicyclomine (BENTYL) 20 MG tablet  .  DULoxetine (CYMBALTA) 30 MG capsule  . furosemide (LASIX) 20 MG tablet  . GLIPIZIDE XL 5 MG 24 hr tablet  . metoprolol succinate (TOPROL-XL) 50 MG 24 hr tablet  . oxyCODONE-acetaminophen (PERCOCET) 10-325 MG tablet  . pantoprazole (PROTONIX) 40 MG tablet  . predniSONE (DELTASONE) 5 MG tablet  . SUMAtriptan (IMITREX) 100 MG tablet  . VENTOLIN HFA 108 (90 BASE) MCG/ACT inhaler   No current facility-administered medications for this encounter.     Anne Bryant [facility] Pre-Surgical Testing [phone] 12/10/18  4:20 PM

## 2018-12-11 ENCOUNTER — Inpatient Hospital Stay (HOSPITAL_COMMUNITY): Payer: Medicare Other

## 2018-12-11 ENCOUNTER — Encounter (HOSPITAL_COMMUNITY): Payer: Self-pay | Admitting: *Deleted

## 2018-12-11 ENCOUNTER — Encounter (HOSPITAL_COMMUNITY)
Admission: RE | Disposition: A | Discharge status: Home / Self Care | Payer: Self-pay | Source: Home / Self Care | Attending: Orthopedic Surgery

## 2018-12-11 ENCOUNTER — Inpatient Hospital Stay (HOSPITAL_COMMUNITY)
Admission: RE | Admit: 2018-12-11 | Discharge: 2018-12-12 | DRG: 470 | Disposition: A | Discharge status: Home / Self Care | Payer: Medicare Other | Attending: Orthopedic Surgery | Admitting: Orthopedic Surgery

## 2018-12-11 ENCOUNTER — Inpatient Hospital Stay (HOSPITAL_COMMUNITY): Payer: Medicare Other | Admitting: Anesthesiology

## 2018-12-11 ENCOUNTER — Inpatient Hospital Stay (HOSPITAL_COMMUNITY): Payer: Medicare Other | Admitting: Physician Assistant

## 2018-12-11 DIAGNOSIS — K2270 Barrett's esophagus without dysplasia: Secondary | ICD-10-CM | POA: Diagnosis not present

## 2018-12-11 DIAGNOSIS — I10 Essential (primary) hypertension: Secondary | ICD-10-CM | POA: Diagnosis not present

## 2018-12-11 DIAGNOSIS — M797 Fibromyalgia: Secondary | ICD-10-CM | POA: Diagnosis present

## 2018-12-11 DIAGNOSIS — J45909 Unspecified asthma, uncomplicated: Secondary | ICD-10-CM | POA: Diagnosis present

## 2018-12-11 DIAGNOSIS — Z79899 Other long term (current) drug therapy: Secondary | ICD-10-CM

## 2018-12-11 DIAGNOSIS — F411 Generalized anxiety disorder: Secondary | ICD-10-CM | POA: Diagnosis present

## 2018-12-11 DIAGNOSIS — Z888 Allergy status to other drugs, medicaments and biological substances status: Secondary | ICD-10-CM | POA: Diagnosis not present

## 2018-12-11 DIAGNOSIS — Z87891 Personal history of nicotine dependence: Secondary | ICD-10-CM | POA: Diagnosis not present

## 2018-12-11 DIAGNOSIS — Z881 Allergy status to other antibiotic agents status: Secondary | ICD-10-CM | POA: Diagnosis not present

## 2018-12-11 DIAGNOSIS — G4733 Obstructive sleep apnea (adult) (pediatric): Secondary | ICD-10-CM | POA: Diagnosis not present

## 2018-12-11 DIAGNOSIS — E785 Hyperlipidemia, unspecified: Secondary | ICD-10-CM | POA: Diagnosis present

## 2018-12-11 DIAGNOSIS — Z885 Allergy status to narcotic agent status: Secondary | ICD-10-CM

## 2018-12-11 DIAGNOSIS — E119 Type 2 diabetes mellitus without complications: Secondary | ICD-10-CM | POA: Diagnosis present

## 2018-12-11 DIAGNOSIS — Z818 Family history of other mental and behavioral disorders: Secondary | ICD-10-CM | POA: Diagnosis not present

## 2018-12-11 DIAGNOSIS — Z887 Allergy status to serum and vaccine status: Secondary | ICD-10-CM | POA: Diagnosis not present

## 2018-12-11 DIAGNOSIS — Z7984 Long term (current) use of oral hypoglycemic drugs: Secondary | ICD-10-CM | POA: Diagnosis not present

## 2018-12-11 DIAGNOSIS — Z96649 Presence of unspecified artificial hip joint: Secondary | ICD-10-CM

## 2018-12-11 DIAGNOSIS — M069 Rheumatoid arthritis, unspecified: Secondary | ICD-10-CM | POA: Diagnosis present

## 2018-12-11 DIAGNOSIS — Z419 Encounter for procedure for purposes other than remedying health state, unspecified: Secondary | ICD-10-CM

## 2018-12-11 DIAGNOSIS — K219 Gastro-esophageal reflux disease without esophagitis: Secondary | ICD-10-CM | POA: Diagnosis not present

## 2018-12-11 DIAGNOSIS — Z6835 Body mass index (BMI) 35.0-35.9, adult: Secondary | ICD-10-CM | POA: Diagnosis not present

## 2018-12-11 DIAGNOSIS — M1611 Unilateral primary osteoarthritis, right hip: Principal | ICD-10-CM | POA: Diagnosis present

## 2018-12-11 DIAGNOSIS — M25551 Pain in right hip: Secondary | ICD-10-CM | POA: Diagnosis present

## 2018-12-11 DIAGNOSIS — F339 Major depressive disorder, recurrent, unspecified: Secondary | ICD-10-CM | POA: Diagnosis not present

## 2018-12-11 DIAGNOSIS — Z96641 Presence of right artificial hip joint: Secondary | ICD-10-CM

## 2018-12-11 HISTORY — PX: TOTAL HIP ARTHROPLASTY: SHX124

## 2018-12-11 LAB — GLUCOSE, CAPILLARY
Glucose-Capillary: 140 mg/dL — ABNORMAL HIGH (ref 70–99)
Glucose-Capillary: 195 mg/dL — ABNORMAL HIGH (ref 70–99)
Glucose-Capillary: 250 mg/dL — ABNORMAL HIGH (ref 70–99)
Glucose-Capillary: 234 mg/dL — ABNORMAL HIGH (ref 70–99)

## 2018-12-11 LAB — TYPE AND SCREEN
ABO/RH(D): A POS
Antibody Screen: NEGATIVE

## 2018-12-11 SURGERY — ARTHROPLASTY, HIP, TOTAL, ANTERIOR APPROACH
Anesthesia: Spinal | Site: Hip | Laterality: Right

## 2018-12-11 MED ORDER — METHOCARBAMOL 500 MG PO TABS
500.0000 mg | ORAL_TABLET | Freq: Four times a day (QID) | ORAL | Status: DC | PRN
  Administered 2018-12-12: 500 mg via ORAL
  Filled 2018-12-11: qty 1

## 2018-12-11 MED ORDER — ACETAMINOPHEN 500 MG PO TABS
1000.0000 mg | ORAL_TABLET | Freq: Four times a day (QID) | ORAL | Status: AC
  Administered 2018-12-11 (×3): 1000 mg via ORAL
  Filled 2018-12-11 (×4): qty 2

## 2018-12-11 MED ORDER — DOCUSATE SODIUM 100 MG PO CAPS
100.0000 mg | ORAL_CAPSULE | Freq: Two times a day (BID) | ORAL | Status: DC
  Filled 2018-12-11 (×2): qty 1

## 2018-12-11 MED ORDER — PHENYLEPHRINE 40 MCG/ML (10ML) SYRINGE FOR IV PUSH (FOR BLOOD PRESSURE SUPPORT)
PREFILLED_SYRINGE | INTRAVENOUS | Status: AC
  Filled 2018-12-11: qty 10

## 2018-12-11 MED ORDER — HYDROMORPHONE HCL 1 MG/ML IJ SOLN
0.2500 mg | INTRAMUSCULAR | Status: DC | PRN
  Administered 2018-12-11 (×3): 0.5 mg via INTRAVENOUS

## 2018-12-11 MED ORDER — PROPOFOL 500 MG/50ML IV EMUL
INTRAVENOUS | Status: DC | PRN
  Administered 2018-12-11: 135 ug/kg/min via INTRAVENOUS

## 2018-12-11 MED ORDER — METHOCARBAMOL 500 MG IVPB - SIMPLE MED
500.0000 mg | Freq: Four times a day (QID) | INTRAVENOUS | Status: DC | PRN
  Administered 2018-12-11: 500 mg via INTRAVENOUS
  Filled 2018-12-11: qty 50

## 2018-12-11 MED ORDER — INSULIN ASPART 100 UNIT/ML ~~LOC~~ SOLN
0.0000 [IU] | Freq: Three times a day (TID) | SUBCUTANEOUS | Status: DC
  Administered 2018-12-11: 5 [IU] via SUBCUTANEOUS

## 2018-12-11 MED ORDER — TRANEXAMIC ACID-NACL 1000-0.7 MG/100ML-% IV SOLN
1000.0000 mg | INTRAVENOUS | Status: AC
  Administered 2018-12-11: 1000 mg via INTRAVENOUS
  Filled 2018-12-11: qty 100

## 2018-12-11 MED ORDER — MAGNESIUM CITRATE PO SOLN: 1.0000 | Freq: Once | ORAL | Status: DC | PRN

## 2018-12-11 MED ORDER — PHENOL 1.4 % MT LIQD
1.0000 | OROMUCOSAL | Status: DC | PRN
  Filled 2018-12-11: qty 177

## 2018-12-11 MED ORDER — METOCLOPRAMIDE HCL 5 MG/ML IJ SOLN: 5.0000 mg | Freq: Three times a day (TID) | INTRAMUSCULAR | Status: DC | PRN

## 2018-12-11 MED ORDER — PANTOPRAZOLE SODIUM 40 MG PO TBEC
40.0000 mg | DELAYED_RELEASE_TABLET | Freq: Two times a day (BID) | ORAL | Status: DC
  Administered 2018-12-11 – 2018-12-12 (×2): 40 mg via ORAL
  Filled 2018-12-11 (×2): qty 1

## 2018-12-11 MED ORDER — LORATADINE 10 MG PO TABS
10.0000 mg | ORAL_TABLET | Freq: Every day | ORAL | Status: DC
  Administered 2018-12-11 – 2018-12-12 (×2): 10 mg via ORAL
  Filled 2018-12-11 (×2): qty 1

## 2018-12-11 MED ORDER — CEFAZOLIN SODIUM-DEXTROSE 2-4 GM/100ML-% IV SOLN
2.0000 g | INTRAVENOUS | Status: AC
  Administered 2018-12-11: 2 g via INTRAVENOUS
  Filled 2018-12-11: qty 100

## 2018-12-11 MED ORDER — METOCLOPRAMIDE HCL 5 MG PO TABS: 5.0000 mg | ORAL_TABLET | Freq: Three times a day (TID) | ORAL | Status: DC | PRN

## 2018-12-11 MED ORDER — DEXAMETHASONE SODIUM PHOSPHATE 10 MG/ML IJ SOLN
10.0000 mg | Freq: Once | INTRAMUSCULAR | Status: AC
  Administered 2018-12-11: 10 mg via INTRAVENOUS

## 2018-12-11 MED ORDER — ONDANSETRON HCL 4 MG PO TABS: 4.0000 mg | ORAL_TABLET | Freq: Four times a day (QID) | ORAL | Status: DC | PRN

## 2018-12-11 MED ORDER — METHOCARBAMOL 500 MG IVPB - SIMPLE MED
INTRAVENOUS | Status: AC
  Filled 2018-12-11: qty 50

## 2018-12-11 MED ORDER — PROPOFOL 10 MG/ML IV BOLUS
INTRAVENOUS | Status: AC
  Filled 2018-12-11: qty 80

## 2018-12-11 MED ORDER — MOMETASONE FURO-FORMOTEROL FUM 200-5 MCG/ACT IN AERO
2.0000 | INHALATION_SPRAY | Freq: Two times a day (BID) | RESPIRATORY_TRACT | Status: DC
  Administered 2018-12-11 – 2018-12-12 (×2): 2 via RESPIRATORY_TRACT
  Filled 2018-12-11: qty 8.8

## 2018-12-11 MED ORDER — DULOXETINE HCL 30 MG PO CPEP
30.0000 mg | ORAL_CAPSULE | Freq: Every day | ORAL | Status: DC
  Administered 2018-12-11 – 2018-12-12 (×2): 30 mg via ORAL
  Filled 2018-12-11 (×2): qty 1

## 2018-12-11 MED ORDER — ACETAMINOPHEN 500 MG PO TABS
1000.0000 mg | ORAL_TABLET | Freq: Once | ORAL | Status: AC
  Administered 2018-12-11: 1000 mg via ORAL
  Filled 2018-12-11: qty 2

## 2018-12-11 MED ORDER — PROPOFOL 10 MG/ML IV BOLUS
INTRAVENOUS | Status: AC
  Filled 2018-12-11: qty 20

## 2018-12-11 MED ORDER — DIPHENHYDRAMINE HCL 12.5 MG/5ML PO ELIX: 12.5000 mg | ORAL_SOLUTION | ORAL | Status: DC | PRN

## 2018-12-11 MED ORDER — STERILE WATER FOR IRRIGATION IR SOLN
Status: DC | PRN
  Administered 2018-12-11: 2000 mL

## 2018-12-11 MED ORDER — 0.9 % SODIUM CHLORIDE (POUR BTL) OPTIME
TOPICAL | Status: DC | PRN
  Administered 2018-12-11: 1000 mL

## 2018-12-11 MED ORDER — MIDAZOLAM HCL 2 MG/2ML IJ SOLN
INTRAMUSCULAR | Status: DC | PRN
  Administered 2018-12-11: 2 mg via INTRAVENOUS

## 2018-12-11 MED ORDER — SODIUM CHLORIDE 0.9 % IV SOLN
INTRAVENOUS | Status: DC
  Administered 2018-12-11 (×2): via INTRAVENOUS

## 2018-12-11 MED ORDER — ONDANSETRON HCL 4 MG/2ML IJ SOLN
INTRAMUSCULAR | Status: DC | PRN
  Administered 2018-12-11: 4 mg via INTRAVENOUS

## 2018-12-11 MED ORDER — FERROUS SULFATE 325 (65 FE) MG PO TABS
325.0000 mg | ORAL_TABLET | Freq: Three times a day (TID) | ORAL | Status: DC
  Administered 2018-12-11 – 2018-12-12 (×2): 325 mg via ORAL
  Filled 2018-12-11 (×2): qty 1

## 2018-12-11 MED ORDER — ONDANSETRON HCL 4 MG/2ML IJ SOLN: 4.0000 mg | Freq: Four times a day (QID) | INTRAMUSCULAR | Status: DC | PRN

## 2018-12-11 MED ORDER — ALBUTEROL SULFATE (2.5 MG/3ML) 0.083% IN NEBU: 3.0000 mL | INHALATION_SOLUTION | Freq: Four times a day (QID) | RESPIRATORY_TRACT | Status: DC | PRN

## 2018-12-11 MED ORDER — OXYCODONE HCL 5 MG PO TABS
10.0000 mg | ORAL_TABLET | ORAL | Status: DC | PRN
  Administered 2018-12-11 – 2018-12-12 (×2): 10 mg via ORAL
  Filled 2018-12-11 (×2): qty 2

## 2018-12-11 MED ORDER — AMITRIPTYLINE HCL 50 MG PO TABS
50.0000 mg | ORAL_TABLET | Freq: Every day | ORAL | Status: DC
  Administered 2018-12-11: 50 mg via ORAL
  Filled 2018-12-11 (×2): qty 1

## 2018-12-11 MED ORDER — POLYETHYLENE GLYCOL 3350 17 G PO PACK
17.0000 g | PACK | Freq: Two times a day (BID) | ORAL | Status: DC
  Filled 2018-12-11 (×2): qty 1

## 2018-12-11 MED ORDER — METOPROLOL SUCCINATE ER 50 MG PO TB24
50.0000 mg | ORAL_TABLET | Freq: Every day | ORAL | Status: DC
  Administered 2018-12-11: 50 mg via ORAL
  Filled 2018-12-11: qty 1

## 2018-12-11 MED ORDER — SUMATRIPTAN SUCCINATE 50 MG PO TABS
100.0000 mg | ORAL_TABLET | ORAL | Status: DC | PRN
  Filled 2018-12-11: qty 2

## 2018-12-11 MED ORDER — HYDROMORPHONE HCL 1 MG/ML IJ SOLN
INTRAMUSCULAR | Status: AC
  Filled 2018-12-11: qty 1

## 2018-12-11 MED ORDER — ATORVASTATIN CALCIUM 10 MG PO TABS
10.0000 mg | ORAL_TABLET | Freq: Every day | ORAL | Status: DC
  Administered 2018-12-11: 10 mg via ORAL
  Filled 2018-12-11: qty 1

## 2018-12-11 MED ORDER — FENTANYL CITRATE (PF) 100 MCG/2ML IJ SOLN: 25.0000 ug | INTRAMUSCULAR | Status: DC | PRN

## 2018-12-11 MED ORDER — GLIPIZIDE ER 5 MG PO TB24
5.0000 mg | ORAL_TABLET | Freq: Every day | ORAL | Status: DC
  Administered 2018-12-12: 5 mg via ORAL
  Filled 2018-12-11: qty 1

## 2018-12-11 MED ORDER — ASPIRIN 81 MG PO CHEW
81.0000 mg | CHEWABLE_TABLET | Freq: Two times a day (BID) | ORAL | Status: DC
  Administered 2018-12-11 – 2018-12-12 (×2): 81 mg via ORAL
  Filled 2018-12-11 (×2): qty 1

## 2018-12-11 MED ORDER — MENTHOL 3 MG MT LOZG: 1.0000 | LOZENGE | OROMUCOSAL | Status: DC | PRN

## 2018-12-11 MED ORDER — PHENYLEPHRINE 40 MCG/ML (10ML) SYRINGE FOR IV PUSH (FOR BLOOD PRESSURE SUPPORT)
PREFILLED_SYRINGE | INTRAVENOUS | Status: DC | PRN
  Administered 2018-12-11 (×3): 120 ug via INTRAVENOUS

## 2018-12-11 MED ORDER — DEXAMETHASONE SODIUM PHOSPHATE 10 MG/ML IJ SOLN
10.0000 mg | Freq: Once | INTRAMUSCULAR | Status: AC
  Administered 2018-12-12: 10 mg via INTRAVENOUS
  Filled 2018-12-11: qty 1

## 2018-12-11 MED ORDER — LACTATED RINGERS IV SOLN
INTRAVENOUS | Status: DC
  Administered 2018-12-11: 07:00:00 via INTRAVENOUS

## 2018-12-11 MED ORDER — OXYCODONE HCL 5 MG PO TABS
5.0000 mg | ORAL_TABLET | ORAL | Status: DC | PRN
  Administered 2018-12-12: 5 mg via ORAL
  Filled 2018-12-11: qty 1

## 2018-12-11 MED ORDER — BUPIVACAINE IN DEXTROSE 0.75-8.25 % IT SOLN
INTRATHECAL | Status: DC | PRN
  Administered 2018-12-11: 1.6 mL via INTRATHECAL

## 2018-12-11 MED ORDER — POVIDONE-IODINE 10 % EX SWAB
2.0000 "application " | Freq: Once | CUTANEOUS | Status: AC
  Administered 2018-12-11: 2 via TOPICAL

## 2018-12-11 MED ORDER — ALUM & MAG HYDROXIDE-SIMETH 200-200-20 MG/5ML PO SUSP: 15.0000 mL | ORAL | Status: DC | PRN

## 2018-12-11 MED ORDER — PROPOFOL 10 MG/ML IV BOLUS
INTRAVENOUS | Status: DC | PRN
  Administered 2018-12-11 (×4): 20 mg via INTRAVENOUS

## 2018-12-11 MED ORDER — BISACODYL 10 MG RE SUPP: 10.0000 mg | Freq: Every day | RECTAL | Status: DC | PRN

## 2018-12-11 MED ORDER — FUROSEMIDE 20 MG PO TABS: 20.0000 mg | ORAL_TABLET | Freq: Every day | ORAL | Status: DC | PRN

## 2018-12-11 MED ORDER — TRANEXAMIC ACID-NACL 1000-0.7 MG/100ML-% IV SOLN
1000.0000 mg | Freq: Once | INTRAVENOUS | Status: AC
  Administered 2018-12-11: 1000 mg via INTRAVENOUS
  Filled 2018-12-11: qty 100

## 2018-12-11 MED ORDER — DEXAMETHASONE SODIUM PHOSPHATE 10 MG/ML IJ SOLN
INTRAMUSCULAR | Status: AC
  Filled 2018-12-11: qty 1

## 2018-12-11 MED ORDER — CEFAZOLIN SODIUM-DEXTROSE 2-4 GM/100ML-% IV SOLN
2.0000 g | Freq: Four times a day (QID) | INTRAVENOUS | Status: AC
  Administered 2018-12-11 (×2): 2 g via INTRAVENOUS
  Filled 2018-12-11 (×2): qty 100

## 2018-12-11 MED ORDER — CHLORHEXIDINE GLUCONATE 4 % EX LIQD: 60.0000 mL | Freq: Once | CUTANEOUS | Status: DC

## 2018-12-11 MED ORDER — ONDANSETRON HCL 4 MG/2ML IJ SOLN
INTRAMUSCULAR | Status: AC
  Filled 2018-12-11: qty 2

## 2018-12-11 MED ORDER — PREDNISONE 5 MG PO TABS
7.5000 mg | ORAL_TABLET | Freq: Every day | ORAL | Status: DC
  Administered 2018-12-12: 7.5 mg via ORAL
  Filled 2018-12-11: qty 2

## 2018-12-11 MED ORDER — MIDAZOLAM HCL 2 MG/2ML IJ SOLN
INTRAMUSCULAR | Status: AC
  Filled 2018-12-11: qty 2

## 2018-12-11 MED ORDER — DICYCLOMINE HCL 20 MG PO TABS
20.0000 mg | ORAL_TABLET | Freq: Four times a day (QID) | ORAL | Status: DC | PRN
  Filled 2018-12-11: qty 1

## 2018-12-11 MED ORDER — CELECOXIB 200 MG PO CAPS
200.0000 mg | ORAL_CAPSULE | Freq: Two times a day (BID) | ORAL | Status: DC
  Administered 2018-12-11 – 2018-12-12 (×2): 200 mg via ORAL
  Filled 2018-12-11 (×2): qty 1

## 2018-12-11 SURGICAL SUPPLY — 49 items
BAG DECANTER FOR FLEXI CONT (MISCELLANEOUS) IMPLANT
BAG ZIPLOCK 12X15 (MISCELLANEOUS) IMPLANT
BLADE SAG 18X100X1.27 (BLADE) ×2 IMPLANT
BLADE SURG SZ10 CARB STEEL (BLADE) ×4 IMPLANT
COVER PERINEAL POST (MISCELLANEOUS) ×2 IMPLANT
COVER SURGICAL LIGHT HANDLE (MISCELLANEOUS) ×2 IMPLANT
COVER WAND RF STERILE (DRAPES) IMPLANT
CUP ACET PINNACLE SECTR 50MM (Hips) ×1 IMPLANT
DERMABOND ADVANCED (GAUZE/BANDAGES/DRESSINGS) ×1
DERMABOND ADVANCED .7 DNX12 (GAUZE/BANDAGES/DRESSINGS) ×1 IMPLANT
DRAPE STERI IOBAN 125X83 (DRAPES) ×2 IMPLANT
DRAPE U-SHAPE 47X51 STRL (DRAPES) ×4 IMPLANT
DRESSING AQUACEL AG SP 3.5X10 (GAUZE/BANDAGES/DRESSINGS) ×1 IMPLANT
DRSG AQUACEL AG ADV 3.5X10 (GAUZE/BANDAGES/DRESSINGS) ×2 IMPLANT
DRSG AQUACEL AG SP 3.5X10 (GAUZE/BANDAGES/DRESSINGS) ×2
DURAPREP 26ML APPLICATOR (WOUND CARE) ×2 IMPLANT
ELECT BLADE TIP CTD 4 INCH (ELECTRODE) ×2 IMPLANT
ELECT REM PT RETURN 15FT ADLT (MISCELLANEOUS) ×2 IMPLANT
ELIMINATOR HOLE APEX DEPUY (Hips) ×2 IMPLANT
FEM STEM 12/14 TAPER SZ 4 HIP (Orthopedic Implant) ×2 IMPLANT
FEMORAL STEM 12/14 TPR SZ4 HIP (Orthopedic Implant) ×1 IMPLANT
GLOVE BIO SURGEON STRL SZ 6 (GLOVE) ×4 IMPLANT
GLOVE BIOGEL PI IND STRL 6.5 (GLOVE) ×1 IMPLANT
GLOVE BIOGEL PI IND STRL 7.5 (GLOVE) ×1 IMPLANT
GLOVE BIOGEL PI IND STRL 8.5 (GLOVE) ×1 IMPLANT
GLOVE BIOGEL PI INDICATOR 6.5 (GLOVE) ×1
GLOVE BIOGEL PI INDICATOR 7.5 (GLOVE) ×1
GLOVE BIOGEL PI INDICATOR 8.5 (GLOVE) ×1
GLOVE ECLIPSE 8.0 STRL XLNG CF (GLOVE) ×4 IMPLANT
GLOVE ORTHO TXT STRL SZ7.5 (GLOVE) ×4 IMPLANT
GOWN STRL REUS W/TWL LRG LVL3 (GOWN DISPOSABLE) ×4 IMPLANT
GOWN STRL REUS W/TWL XL LVL3 (GOWN DISPOSABLE) ×2 IMPLANT
HEAD FEMORAL 32 CERAMIC (Hips) ×2 IMPLANT
HOLDER FOLEY CATH W/STRAP (MISCELLANEOUS) ×2 IMPLANT
KIT TURNOVER KIT A (KITS) IMPLANT
LINER ACET PNNCL PLUS4 NEUTRAL (Hips) ×1 IMPLANT
PACK ANTERIOR HIP CUSTOM (KITS) ×2 IMPLANT
PINNACLE PLUS 4 NEUTRAL (Hips) ×2 IMPLANT
PINNACLE SECTOR CUP 50MM (Hips) ×2 IMPLANT
SCREW PINN CAN 6.5X20 (Screw) ×2 IMPLANT
SUT MNCRL AB 4-0 PS2 18 (SUTURE) ×2 IMPLANT
SUT STRATAFIX 0 PDS 27 VIOLET (SUTURE) ×2
SUT VIC AB 1 CT1 36 (SUTURE) ×6 IMPLANT
SUT VIC AB 2-0 CT1 27 (SUTURE) ×2
SUT VIC AB 2-0 CT1 TAPERPNT 27 (SUTURE) ×2 IMPLANT
SUTURE STRATFX 0 PDS 27 VIOLET (SUTURE) ×1 IMPLANT
TRAY FOLEY MTR SLVR 14FR STAT (SET/KITS/TRAYS/PACK) ×2 IMPLANT
WATER STERILE IRR 1000ML POUR (IV SOLUTION) ×2 IMPLANT
YANKAUER SUCT BULB TIP 10FT TU (MISCELLANEOUS) IMPLANT

## 2018-12-11 NOTE — Anesthesia Procedure Notes (Signed)
Spinal  Patient location during procedure: OR Start time: 12/11/2018 8:32 AM End time: 12/11/2018 8:36 AM Staffing Anesthesiologist: Roderic Palau, MD Performed: anesthesiologist  Preanesthetic Checklist Completed: patient identified, surgical consent, pre-op evaluation, timeout performed, IV checked, risks and benefits discussed and monitors and equipment checked Spinal Block Patient position: sitting Prep: DuraPrep Patient monitoring: cardiac monitor, continuous pulse ox and blood pressure Approach: midline Location: L3-4 Injection technique: single-shot Needle Needle type: Pencan  Needle gauge: 24 G Needle length: 9 cm Assessment Sensory level: T8 Additional Notes Functioning IV was confirmed and monitors were applied. Sterile prep and drape, including hand hygiene and sterile gloves were used. The patient was positioned and the spine was prepped. The skin was anesthetized with lidocaine.  Free flow of clear CSF was obtained prior to injecting local anesthetic into the CSF.  The spinal needle aspirated freely following injection.  The needle was carefully withdrawn.  The patient tolerated the procedure well.

## 2018-12-11 NOTE — Anesthesia Procedure Notes (Signed)
Procedure Name: MAC Date/Time: 12/11/2018 8:32 AM Performed by: Niel Hummer, CRNA Pre-anesthesia Checklist: Emergency Drugs available, Patient identified, Suction available and Patient being monitored Patient Re-evaluated:Patient Re-evaluated prior to induction Oxygen Delivery Method: Simple face mask

## 2018-12-11 NOTE — Evaluation (Signed)
Physical Therapy Evaluation Patient Details Name: Anne Bryant MRN: [id number] DOB: [birth year] Today's Date: 12/11/2018   History of Present Illness  s/p  DA R  THA  Clinical Impression  Pt is s/p THA resulting in the deficits listed below (see PT Problem List).  Pt amb 40' with min  Assist, incr time needed d/t pain  Pt will benefit from skilled PT to increase their independence and safety with mobility to allow discharge to the venue listed below.     Follow Up Recommendations Follow surgeon's recommendation for DC plan and follow-up therapies    Equipment Recommendations  None recommended by PT    Recommendations for Other Services       Precautions / Restrictions Precautions Precautions: Fall Restrictions Weight Bearing Restrictions: No Other Position/Activity Restrictions: WBAT      Mobility  Bed Mobility Overal bed mobility: Needs Assistance Bed Mobility: Supine to Sit     Supine to sit: Min assist     General bed mobility comments: assist with RLE and trunk,incr time needed  Transfers Overall transfer level: Needs assistance Equipment used: Rolling walker (2 wheeled) Transfers: Sit to/from Stand Sit to Stand: Min assist         General transfer comment: assist to rise, cues for hand placement and RLE position  Ambulation/Gait Ambulation/Gait assistance: Min assist Gait Distance (Feet): 40 Feet Assistive device: Rolling walker (2 wheeled) Gait Pattern/deviations: Step-to pattern;Decreased stance time - right     General Gait Details: cues for sequence and RW position  Stairs            Wheelchair Mobility    Modified Rankin (Stroke Patients Only)       Balance Overall balance assessment: Needs assistance;History of Falls(reports fall in March where she could not get back up)           Standing balance-Leahy Scale: Poor Standing balance comment: reliant on UEs                             Pertinent  Vitals/Pain Pain Assessment: 0-10 Pain Score: 5  Pain Location: right hip Pain Descriptors / Indicators: Aching;Grimacing;Sore;Operative site guarding Pain Intervention(s): Limited activity within patient's tolerance;Monitored during session    [place] expects to be discharged to:: Private residence Living Arrangements: Alone Available Help at Discharge: Family Type of Home: House Home Access: Stairs to enter   Technical brewer of Steps: 3 Home Layout: One level Home Equipment: Environmental consultant - 2 wheels;Walker - 4 wheels;Cane - single point;Tub bench Additional Comments: pt is going to son's house at d/c, then will return to her apartment    Prior Function Level of Independence: Independent               Hand Dominance        Extremity/Trunk Assessment   Upper Extremity Assessment Upper Extremity Assessment: Overall WFL for tasks assessed    Lower Extremity Assessment Lower Extremity Assessment: RLE deficits/detail RLE Deficits / Details: ankle WFL; knee and hip grossly 2+/5, limited by pain RLE: Unable to fully assess due to pain       Communication   Communication: No difficulties  Cognition Arousal/Alertness: Awake/alert Behavior During Therapy: WFL for tasks assessed/performed Overall Cognitive Status: Within Functional Limits for tasks assessed  General Comments      Exercises     Assessment/Plan    PT Assessment Patient needs continued PT services  PT Problem List Decreased strength;Decreased activity tolerance;Decreased mobility;Decreased knowledge of use of DME;Pain       PT Treatment Interventions DME instruction;Gait training;Functional mobility training;Therapeutic activities;Therapeutic exercise;Patient/family education;Stair training    PT Goals (Current goals can be found in the Care Plan section)  Acute Rehab PT Goals PT Goal Formulation: With patient Time For  Goal Achievement: 12/18/18 Potential to Achieve Goals: Good    Frequency 7X/week   Barriers to discharge        Co-evaluation               AM-PAC PT "6 Clicks" Mobility  Outcome Measure Help needed turning from your back to your side while in a flat bed without using bedrails?: A Little Help needed moving from lying on your back to sitting on the side of a flat bed without using bedrails?: A Little Help needed moving to and from a bed to a chair (including a wheelchair)?: A Little Help needed standing up from a chair using your arms (e.g., wheelchair or bedside chair)?: A Little Help needed to walk in hospital room?: A Little Help needed climbing 3-5 steps with a railing? : A Lot 6 Click Score: 17    End of Session Equipment Utilized During Treatment: Gait belt Activity Tolerance: Patient tolerated treatment well Patient left: with call bell/phone within reach;in chair;with chair alarm set   PT Visit Diagnosis: Difficulty in walking, not elsewhere classified (R26.2)    Time: 6767-2094 PT Time Calculation (min) (ACUTE ONLY): 31 min   Charges:   PT Evaluation $PT Eval Low Complexity: 1 Low PT Treatments $Gait Training: 8-22 mins        Drucilla Chalet, PT  Pager: [phone] Acute Rehab Dept [facility]): [phone]   12/11/2018   [facility] 12/11/2018, 4:26 PM

## 2018-12-11 NOTE — Transfer of Care (Signed)
Immediate Anesthesia Transfer of Care Note  Patient: [place] M Pinder  Procedure(s) Performed: TOTAL HIP ARTHROPLASTY ANTERIOR APPROACH (Right Hip)  Patient Location: PACU  Anesthesia Type:Spinal  Level of Consciousness: awake  Airway & Oxygen Therapy: Patient Spontanous Breathing and Patient connected to face mask oxygen  Post-op Assessment: Report given to RN and Post -op Vital signs reviewed and stable  Post vital signs: Reviewed and stable  Last Vitals:  Vitals Value Taken Time  BP 142/98 12/11/18 1024  Temp    Pulse 58 12/11/18 1025  Resp 18 12/11/18 1025  SpO2 91 % 12/11/18 1025  Vitals shown include unvalidated device data.  Last Pain:  Vitals:   12/11/18 0637  TempSrc: Oral  PainSc:       Patients Stated Pain Goal: 4 (32/12/24 8250)  Complications: No apparent anesthesia complications

## 2018-12-11 NOTE — Discharge Instructions (Signed)

## 2018-12-11 NOTE — Anesthesia Postprocedure Evaluation (Signed)
Anesthesia Post Note  Patient: Marquita M Brilliant  Procedure(s) Performed: TOTAL HIP ARTHROPLASTY ANTERIOR APPROACH (Right Hip)     Patient location during evaluation: PACU Anesthesia Type: Spinal Level of consciousness: oriented and awake and alert Pain management: pain level controlled Vital Signs Assessment: post-procedure vital signs reviewed and stable Respiratory status: spontaneous breathing, respiratory function stable and patient connected to nasal cannula oxygen Cardiovascular status: blood pressure returned to baseline and stable Postop Assessment: no headache, no backache, no apparent nausea or vomiting, spinal receding and patient able to bend at knees Anesthetic complications: no    Last Vitals:  Vitals:   12/11/18 1115 12/11/18 1130  BP: (!) 165/95 (!) 165/93  Pulse: (!) 51 (!) 51  Resp: 16 12  Temp:    SpO2: 100% 100%    Last Pain:  Vitals:   12/11/18 1130  TempSrc:   PainSc: Asleep                 Naysha Sholl,W. EDMOND

## 2018-12-11 NOTE — Op Note (Signed)
NAME:  Anne Bryant                ACCOUNT NO.: [id number]      MEDICAL RECORD NO.: [id number]      FACILITY:  [facility]      PHYSICIAN:  Mauri Pole  DATE OF BIRTH:  1945-08-24     DATE OF PROCEDURE:  12/11/2018                                 OPERATIVE REPORT         PREOPERATIVE DIAGNOSIS: Right  hip osteoarthritis.      POSTOPERATIVE DIAGNOSIS:  Right hip osteoarthritis.      PROCEDURE:  Right total hip replacement through an anterior approach   utilizing DePuy THR system, component size 55mm pinnacle cup, a size 32+4 neutral   Altrex liner, a size 4 Hi Actis stem with a 32+1 delta ceramic   ball.      SURGEON:  Pietro Cassis. Alvan Dame, M.D.      ASSISTANT:  Danae Orleans, PA-C     ANESTHESIA:  Spinal.      SPECIMENS:  None.      COMPLICATIONS:  None.      BLOOD LOSS:  500 cc     DRAINS:  None.      INDICATION OF THE PROCEDURE:  Anne Bryant is a 73 y.o. female who had   presented to office for evaluation of right hip pain.  Radiographs revealed   progressive degenerative changes with bone-on-bone   articulation of the  hip joint, including subchondral cystic changes and osteophytes.  The patient had painful limited range of   motion significantly affecting their overall quality of life and function.  The patient was failing to    respond to conservative measures including medications and/or injections and activity modification and at this point was ready   to proceed with more definitive measures.  Consent was obtained for   benefit of pain relief.  Specific risks of infection, DVT, component   failure, dislocation, neurovascular injury, and need for revision surgery were reviewed in the office as well discussion of   the anterior versus posterior approach were reviewed.     PROCEDURE IN DETAIL:  The patient was brought to operative theater.   Once adequate anesthesia, preoperative antibiotics, 2 gm of Ancef, 1 gm of Tranexamic  Acid, and 10 mg of Decadron were administered, the patient was positioned supine on the [employer] table.  Once the patient was safely positioned with adequate padding of boney prominences we predraped out the hip, and used fluoroscopy to confirm orientation of the pelvis.      The right hip was then prepped and draped from proximal iliac crest to   mid thigh with a shower curtain technique.      Time-out was performed identifying the patient, planned procedure, and the appropriate extremity.     An incision was then made 2 cm lateral to the   anterior superior iliac spine extending over the orientation of the   tensor fascia lata muscle and sharp dissection was carried down to the   fascia of the muscle.      The fascia was then incised.  The muscle belly was identified and swept   laterally and retractor placed along the superior neck.  Following   cauterization of the circumflex vessels and removing some pericapsular  fat, a second cobra retractor was placed on the inferior neck.  A T-capsulotomy was made along the line of the   superior neck to the trochanteric fossa, then extended proximally and   distally.  Tag sutures were placed and the retractors were then placed   intracapsular.  We then identified the trochanteric fossa and   orientation of my neck cut and then made a neck osteotomy with the femur on traction.  The femoral   head was removed without difficulty or complication.  Traction was let   off and retractors were placed posterior and anterior around the   acetabulum.      The labrum and foveal tissue were debrided.  I began reaming with a 45 mm   reamer and reamed up to 49 mm reamer with good bony bed preparation and a 50 mm  cup was chosen.  The final 50 mm Pinnacle cup was then impacted under fluoroscopy to confirm the depth of penetration and orientation with respect to   Abduction and forward flexion.  A screw was placed into the ilium followed by the hole eliminator.   The final   32+4 neutral Altrex liner was impacted with good visualized rim fit.  The cup was positioned anatomically within the acetabular portion of the pelvis.      At this point, the femur was rolled to 100 degrees.  Further capsule was   released off the inferior aspect of the femoral neck.  I then   released the superior capsule proximally.  With the leg in a neutral position the hook was placed laterally   along the femur under the vastus lateralis origin and elevated manually and then held in position using the hook attachment on the bed.  The leg was then extended and adducted with the leg rolled to 100   degrees of external rotation.  Retractors were placed along the medial calcar and posteriorly over the greater trochanter.  Once the proximal femur was fully   exposed, I used a box osteotome to set orientation.  I then began   broaching with the starting chili pepper broach and passed this by hand and then broached up to 4.  With the 4 broach in place I chose a high offset neck and did several trial reductions.  The offset was appropriate, leg lengths   appeared to be equal best matched with the +1 head ball trial confirmed radiographically.   Given these findings, I went ahead and dislocated the hip, repositioned all   retractors and positioned the right hip in the extended and abducted position.  The final 4 Hi Actis stem was   chosen and it was impacted down to the level of neck cut.  Based on this   and the trial reductions, a final 32+1 delta ceramic ball was chosen and   impacted onto a clean and dry trunnion, and the hip was reduced.  The   hip had been irrigated throughout the case again at this point.  I did   reapproximate the superior capsular leaflet to the anterior leaflet   using #1 Vicryl.  The fascia of the   tensor fascia lata muscle was then reapproximated using #1 Vicryl and #0 Stratafix sutures.  The   remaining wound was closed with 2-0 Vicryl and running 4-0  Monocryl.   The hip was cleaned, dried, and dressed sterilely using Dermabond and   Aquacel dressing.  The patient was then brought   to recovery room in  stable condition tolerating the procedure well.    Lanney Gins, PA-C was present for the entirety of the case involved from   preoperative positioning, perioperative retractor management, general   facilitation of the case, as well as primary wound closure as assistant.            Madlyn Frankel Charlann Boxer, M.D.        12/11/2018 9:58 AM

## 2018-12-11 NOTE — Interval H&P Note (Signed)
History and Physical Interval Note:  12/11/2018 7:00 AM  Anne Bryant  has presented today for surgery, with the diagnosis of Right hip osteoarthritis.  The various methods of treatment have been discussed with the patient and family. After consideration of risks, benefits and other options for treatment, the patient has consented to  Procedure(s): TOTAL HIP ARTHROPLASTY ANTERIOR APPROACH (Right) as a surgical intervention.  The patient's history has been reviewed, patient examined, no change in status, stable for surgery.  I have reviewed the patient's chart and labs.  Questions were answered to the patient's satisfaction.     Mauri Pole

## 2018-12-12 ENCOUNTER — Encounter (HOSPITAL_COMMUNITY): Payer: Self-pay | Admitting: Orthopedic Surgery

## 2018-12-12 LAB — CBC
HCT: 33.2 % — ABNORMAL LOW (ref 36.0–46.0)
Hemoglobin: 9.8 g/dL — ABNORMAL LOW (ref 12.0–15.0)
MCH: 21.2 pg — ABNORMAL LOW (ref 26.0–34.0)
MCHC: 29.5 g/dL — ABNORMAL LOW (ref 30.0–36.0)
MCV: 71.9 fL — ABNORMAL LOW (ref 80.0–100.0)
Platelets: 166 10*3/uL (ref 150–400)
RBC: 4.62 MIL/uL (ref 3.87–5.11)
RDW: 15.7 % — ABNORMAL HIGH (ref 11.5–15.5)
WBC: 10.7 10*3/uL — ABNORMAL HIGH (ref 4.0–10.5)
nRBC: 0 % (ref 0.0–0.2)

## 2018-12-12 LAB — GLUCOSE, CAPILLARY
Glucose-Capillary: 117 mg/dL — ABNORMAL HIGH (ref 70–99)
Glucose-Capillary: 179 mg/dL — ABNORMAL HIGH (ref 70–99)

## 2018-12-12 LAB — BASIC METABOLIC PANEL
Anion gap: 10 (ref 5–15)
BUN: 16 mg/dL (ref 8–23)
CO2: 25 mmol/L (ref 22–32)
Calcium: 8.2 mg/dL — ABNORMAL LOW (ref 8.9–10.3)
Chloride: 104 mmol/L (ref 98–111)
Creatinine, Ser: 0.89 mg/dL (ref 0.44–1.00)
GFR calc Af Amer: >60 mL/min (ref >60)
GFR calc non Af Amer: >60 mL/min (ref >60)
Glucose, Bld: 148 mg/dL — ABNORMAL HIGH (ref 70–99)
Potassium: 4.2 mmol/L (ref 3.5–5.1)
Sodium: 139 mmol/L (ref 135–145)

## 2018-12-12 MED ORDER — DOCUSATE SODIUM 100 MG PO CAPS: 100.0000 mg | ORAL_CAPSULE | Freq: Two times a day (BID) | ORAL | 0 refills | Status: DC

## 2018-12-12 MED ORDER — FERROUS SULFATE 325 (65 FE) MG PO TABS: 325.0000 mg | ORAL_TABLET | Freq: Three times a day (TID) | ORAL | 0 refills | Status: DC

## 2018-12-12 MED ORDER — ASPIRIN 81 MG PO CHEW: 81.0000 mg | CHEWABLE_TABLET | Freq: Two times a day (BID) | ORAL | 0 refills | Status: DC

## 2018-12-12 MED ORDER — OXYCODONE HCL 5 MG PO TABS: 5.0000 mg | ORAL_TABLET | ORAL | 0 refills | Status: DC | PRN

## 2018-12-12 MED ORDER — POLYETHYLENE GLYCOL 3350 17 G PO PACK: 17.0000 g | PACK | Freq: Two times a day (BID) | ORAL | 0 refills | Status: DC

## 2018-12-12 MED ORDER — METHOCARBAMOL 500 MG PO TABS: 500.0000 mg | ORAL_TABLET | Freq: Four times a day (QID) | ORAL | 0 refills | Status: DC | PRN

## 2018-12-12 NOTE — Progress Notes (Signed)
The pt was provided with d/c instructions. After discussing the pt's plan of care, the pt reported no further questions or concerns.Pt waiting for their ride to arrive.

## 2018-12-12 NOTE — Progress Notes (Signed)
Physical Therapy Treatment Patient Details Name: Anne Bryant MRN: [id number] DOB: 10-18-1945 Today's Date: 12/12/2018    History of Present Illness s/p  DA R  THA    PT Comments    Pt progressing; ready to d/c home with family assist;    Follow Up Recommendations  Follow surgeon's recommendation for DC plan and follow-up therapies Recommend supervision for mobility     Equipment Recommendations  None recommended by PT    Recommendations for Other Services       Precautions / Restrictions Precautions Precautions: Fall Restrictions Weight Bearing Restrictions: No Other Position/Activity Restrictions: WBAT    Mobility  Bed Mobility Overal bed mobility: Needs Assistance Bed Mobility: Supine to Sit     Supine to sit: Supervision;HOB elevated     General bed mobility comments: incr time  Transfers Overall transfer level: Needs assistance Equipment used: Rolling walker (2 wheeled) Transfers: Sit to/from Stand Sit to Stand: Supervision         General transfer comment: cues for hand placement, repeated x 3  Ambulation/Gait Ambulation/Gait assistance: Min guard;Supervision Gait Distance (Feet): 100 Feet Assistive device: Rolling walker (2 wheeled) Gait Pattern/deviations: Step-to pattern;Decreased stance time - right     General Gait Details: cues for sequence and RW position   Stairs Stairs: Yes Stairs assistance: Min assist Stair Management: Two rails;Step to pattern;Forwards Number of Stairs: 3 General stair comments: cues for sequence, pt felt it was easier to go up with her operated LE, incr time and assist to boost up on to step   Wheelchair Mobility    Modified Rankin (Stroke Patients Only)       Balance                                            Cognition Arousal/Alertness: Awake/alert Behavior During Therapy: WFL for tasks assessed/performed Overall Cognitive Status: Within Functional Limits for tasks  assessed                                        Exercises Total Joint Exercises Ankle Circles/Pumps: AROM;15 reps;Both Quad Sets: AROM;Both;10 reps Short Arc Quad: AROM;Right;10 reps Heel Slides: AAROM;AROM;Right;10 reps Hip ABduction/ADduction: AROM;AAROM;Right;10 reps    General Comments        Pertinent Vitals/Pain Pain Assessment: 0-10 Pain Score: 4  Pain Location: right hip Pain Descriptors / Indicators: Aching;Grimacing;Sore;Operative site guarding Pain Intervention(s): Limited activity within patient's tolerance;Monitored during session;Premedicated before session;Repositioned;Ice applied    Home Living                      Prior Function            PT Goals (current goals can now be found in the care plan section) Acute Rehab PT Goals PT Goal Formulation: With patient Time For Goal Achievement: 12/18/18 Potential to Achieve Goals: Good Progress towards PT goals: Progressing toward goals    Frequency    7X/week      PT Plan Current plan remains appropriate    Co-evaluation              AM-PAC PT "6 Clicks" Mobility   Outcome Measure  Help needed turning from your back to your side while in a flat bed without using bedrails?: A Little Help needed  moving from lying on your back to sitting on the side of a flat bed without using bedrails?: A Little Help needed moving to and from a bed to a chair (including a wheelchair)?: A Little Help needed standing up from a chair using your arms (e.g., wheelchair or bedside chair)?: A Little Help needed to walk in hospital room?: A Little Help needed climbing 3-5 steps with a railing? : A Little 6 Click Score: 18    End of Session Equipment Utilized During Treatment: Gait belt Activity Tolerance: Patient tolerated treatment well Patient left: in chair;with call bell/phone within reach;with chair alarm set   PT Visit Diagnosis: Difficulty in walking, not elsewhere classified  (R26.2)     Time: 9371-6967 PT Time Calculation (min) (ACUTE ONLY): 36 min  Charges:  $Gait Training: 8-22 mins $Therapeutic Exercise: 8-22 mins                     Kenyon Ana, PT  Pager: [phone] Acute Rehab Dept [facility]): [phone]   12/12/2018    [facility] 12/12/2018, 12:08 PM

## 2018-12-12 NOTE — TOC Transition Note (Signed)
Transition of Care [facility]) - CM/SW Discharge Note   Patient Details  Name: Anne Bryant MRN: [id number] Date of Birth: [birth year]  [facility] CM/SW Contact:  Leeroy Cha, RN Phone Number: 12/12/2018, 10:36 AM   Clinical Narrative:    DCD to home with HEP & equipment  Final next level of care: Home/Self Care Barriers to Discharge: No Barriers Identified   Patient Goals and CMS Choice Patient states their goals for this hospitalization and ongoing recovery are:: to go home CMS Medicare.gov Compare Post Acute Care list provided to:: Patient    Discharge Placement                       Discharge Plan and Services   Discharge Planning Services: CM Consult Post Acute Care Choice: Durable Medical Equipment          DME Arranged: Walker rolling, 3-N-1 DME Agency: [employer] Date DME Agency Contacted: 12/12/18 Time DME Agency Contacted: 0900 Representative spoke with at DME Agency: nathan            Social Determinants of Health ([place]) Interventions     Readmission Risk Interventions No flowsheet data found.

## 2018-12-12 NOTE — Progress Notes (Signed)
   Subjective: 1 Day Post-Op Procedure(s) (LRB): TOTAL HIP ARTHROPLASTY ANTERIOR APPROACH (Right) Patient reports pain as mild.   Patient seen in rounds by Dr. Alvan Dame. Patient is well, and has had no acute complaints or problems other than discomfort in the right hip. No acute events overnight. Patient states she is ready to go home today. Denies CP, SHOB. We will start therapy today.   Objective: Vital signs in last 24 hours: Temp:  [97.5 F (36.4 C)-98.3 F (36.8 C)] 98 F (36.7 C) (08/05 0359) Pulse Rate:  [51-66] 56 (08/05 0359) Resp:  [6-17] 17 (08/05 0359) BP: (97-168)/(70-123) 109/77 (08/05 0359) SpO2:  [90 %-100 %] 95 % (08/05 0808)  Intake/Output from previous day:  Intake/Output Summary (Last 24 hours) at 12/12/2018 0834 Last data filed at 12/12/2018 0600 Gross per 24 hour  Intake 4091.6 ml  Output 2425 ml  Net 1666.6 ml     Intake/Output this shift: No intake/output data recorded.  Labs: Recent Labs    12/10/18 1528 12/12/18 0313  HGB 12.9 9.8*   Recent Labs    12/10/18 1528 12/12/18 0313  WBC 9.7 10.7*  RBC 6.02* 4.62  HCT 42.8 33.2*  PLT 214 166   Recent Labs    12/10/18 1528 12/12/18 0313  NA 141 139  K 4.5 4.2  CL 104 104  CO2 27 25  BUN 12 16  CREATININE 0.71 0.89  GLUCOSE 158* 148*  CALCIUM 9.5 8.2*   No results for input(s): LABPT, INR in the last 72 hours.  Exam: General - Patient is Alert and Oriented Extremity - Neurologically intact Sensation intact distally Intact pulses distally Dorsiflexion/Plantar flexion intact Dressing - dressing C/D/I Motor Function - intact, moving foot and toes well on exam.   Past Medical History:  Diagnosis Date  . Arthritis   . Asthma   . Barrett's esophagus    PERSONAL HISTORY PER PATIENT   . Complication of anesthesia   . Diabetes mellitus without complication (HCC)    TYPE 2 , MONITORS CBGS AT HOME   . Diverticulitis   . Dyspnea    pulmonogist Br[place]man   . Dysrhythmia   . Enlarged  tonsils   . Fibromyalgia 2006  . GERD (gastroesophageal reflux disease)   . History of blood transfusion   . Hypertension   . Migraine   . Narrowing of airway    per patient report   . Panic attack    per patient report  . Pneumonia 07/2018  . Renal insufficiency    per patient report, acute kidney injury when she had pneumonia march 2020 , reports back normal   . Sleep apnea    OSA WITH BIPAP     Assessment/Plan: 1 Day Post-Op Procedure(s) (LRB): TOTAL HIP ARTHROPLASTY ANTERIOR APPROACH (Right) Active Problems:   Status post right hip replacement  Estimated body mass index is 35.24 kg/m as calculated from the following:   Height as of this encounter: 5' 1.5" (1.562 m).   Weight as of this encounter: 86 kg. Advance diet Up with therapy D/C IV fluids  DVT Prophylaxis - Aspirin Weight bearing as tolerated. D/C O2 and pulse ox and try on room air.  Plan is to go Home after hospital stay with HEP. Plan for discharge today after 1-2 sessions of physical therapy as long as she is meeting goals. Follow up in the office in 2 weeks with Dr. Alvan Dame.   Griffith Citron, PA-C Orthopedic Surgery 12/12/2018, 8:34 AM

## 2018-12-16 NOTE — Discharge Summary (Signed)
Physician Discharge Summary   Patient ID: Anne Bryant MRN: [id number] DOB/AGE: 72-09-1945 72 y.o.  Admit date: 12/11/2018 Discharge date: 12/12/2018  Primary Diagnosis: Right  hip osteoarthritis   Admission Diagnoses:  Past Medical History:  Diagnosis Date  . Arthritis   . Asthma   . Barrett's esophagus    PERSONAL HISTORY PER PATIENT   . Complication of anesthesia   . Diabetes mellitus without complication (HCC)    TYPE 2 , MONITORS CBGS AT HOME   . Diverticulitis   . Dyspnea    pulmonogist [place]   . Dysrhythmia   . Enlarged tonsils   . Fibromyalgia 2006  . GERD (gastroesophageal reflux disease)   . History of blood transfusion   . Hypertension   . Migraine   . Narrowing of airway    per patient report   . Panic attack    per patient report  . Pneumonia 07/2018  . Renal insufficiency    per patient report, acute kidney injury when she had pneumonia march 2020 , reports back normal   . Sleep apnea    OSA WITH BIPAP    Discharge Diagnoses:   Active Problems:   Status post right hip replacement  Estimated body mass index is 35.24 kg/m as calculated from the following:   Height as of this encounter: 5' 1.5" (1.562 m).   Weight as of this encounter: 86 kg.  Procedure:  Procedure(s) (LRB): TOTAL HIP ARTHROPLASTY ANTERIOR APPROACH (Right)   Consults: None  HPI:  Anne Bryant is a 73 y.o. female who had   presented to office for evaluation of right hip pain.  Radiographs revealed   progressive degenerative changes with bone-on-bone   articulation of the  hip joint, including subchondral cystic changes and osteophytes.  The patient had painful limited range of   motion significantly affecting their overall quality of life and function.  The patient was failing to    respond to conservative measures including medications and/or injections and activity modification and at this point was ready   to proceed with more definitive measures.  Consent was  obtained for   benefit of pain relief.  Specific risks of infection, DVT, component   failure, dislocation, neurovascular injury, and need for revision surgery were reviewed in the office as well discussion of   the anterior versus posterior approach were reviewed.  Laboratory Data: Admission on 12/11/2018, Discharged on 12/12/2018  Component Date Value Ref Range Status  . Glucose-Capillary 12/11/2018 140* 70 - 99 mg/dL Final  . Comment 1 40/98/119108/08/2018 Notify RN   Final  . Comment 2 12/11/2018 Document in Chart   Final  . Glucose-Capillary 12/11/2018 195* 70 - 99 mg/dL Final  . Glucose-Capillary 12/11/2018 250* 70 - 99 mg/dL Final  . WBC 47/82/956208/09/2018 10.7* 4.0 - 10.5 K/uL Final  . RBC 12/12/2018 4.62  3.87 - 5.11 MIL/uL Final  . Hemoglobin 12/12/2018 9.8* 12.0 - 15.0 g/dL Final  . HCT 13/08/657808/09/2018 33.2* 36.0 - 46.0 % Final  . MCV 12/12/2018 71.9* 80.0 - 100.0 fL Final  . MCH 12/12/2018 21.2* 26.0 - 34.0 pg Final  . MCHC 12/12/2018 29.5* 30.0 - 36.0 g/dL Final  . RDW 46/96/295208/09/2018 15.7* 11.5 - 15.5 % Final  . Platelets 12/12/2018 166  150 - 400 K/uL Final  . nRBC 12/12/2018 0.0  0.0 - 0.2 % Final   Performed at [facility], [address] [place], [place] [id number]  . Sodium 12/12/2018 139  135 -  145 mmol/L Final  . Potassium 12/12/2018 4.2  3.5 - 5.1 mmol/L Final  . Chloride 12/12/2018 104  98 - 111 mmol/L Final  . CO2 12/12/2018 25  22 - 32 mmol/L Final  . Glucose, Bld 12/12/2018 148* 70 - 99 mg/dL Final  . BUN 16/02/9603 16  8 - 23 mg/dL Final  . Creatinine, Ser 12/12/2018 0.89  0.44 - 1.00 mg/dL Final  . Calcium 54/01/8118 8.2* 8.9 - 10.3 mg/dL Final  . GFR calc non Af Amer 12/12/2018 >60  >60 mL/min Final  . GFR calc Af Amer 12/12/2018 >60  >60 mL/min Final  . Anion gap 12/12/2018 10  5 - 15 Final   Performed at [facility], [address] [place], [place] [address]  . Glucose-Capillary 12/11/2018 234* 70 - 99 mg/dL Final  . Comment 1  95/62/1308 Notify RN   Final  . Comment 2 12/11/2018 Document in Chart   Final  . Glucose-Capillary 12/12/2018 117* 70 - 99 mg/dL Final  . Glucose-Capillary 12/12/2018 179* 70 - 99 mg/dL Final  Hospital Outpatient Visit on 12/10/2018  Component Date Value Ref Range Status  . Hgb A1c MFr Bld 12/10/2018 7.5* 4.8 - 5.6 % Final   Comment: (NOTE) Pre diabetes:          5.7%-6.4% Diabetes:              >6.4% Glycemic control for   <7.0% adults with diabetes   . Mean Plasma Glucose 12/10/2018 168.55  mg/dL Final   Performed at [facility], [address], [place], [place] [address]  . WBC 12/10/2018 9.7  4.0 - 10.5 K/uL Final  . RBC 12/10/2018 6.02* 3.87 - 5.11 MIL/uL Final  . Hemoglobin 12/10/2018 12.9  12.0 - 15.0 g/dL Final  . HCT 69/62/9528 42.8  36.0 - 46.0 % Final  . MCV 12/10/2018 71.1* 80.0 - 100.0 fL Final  . MCH 12/10/2018 21.4* 26.0 - 34.0 pg Final  . MCHC 12/10/2018 30.1  30.0 - 36.0 g/dL Final  . RDW 41/32/4401 16.8* 11.5 - 15.5 % Final  . Platelets 12/10/2018 214  150 - 400 K/uL Final  . nRBC 12/10/2018 0.0  0.0 - 0.2 % Final   Performed at [facility], [address] [place], [place] [address]  . Sodium 12/10/2018 141  135 - 145 mmol/L Final  . Potassium 12/10/2018 4.5  3.5 - 5.1 mmol/L Final  . Chloride 12/10/2018 104  98 - 111 mmol/L Final  . CO2 12/10/2018 27  22 - 32 mmol/L Final  . Glucose, Bld 12/10/2018 158* 70 - 99 mg/dL Final  . BUN 36/64/4034 12  8 - 23 mg/dL Final  . Creatinine, Ser 12/10/2018 0.71  0.44 - 1.00 mg/dL Final  . Calcium 74/25/9563 9.5  8.9 - 10.3 mg/dL Final  . GFR calc non Af Amer 12/10/2018 >60  >60 mL/min Final  . GFR calc Af Amer 12/10/2018 >60  >60 mL/min Final  . Anion gap 12/10/2018 10  5 - 15 Final   Performed at [facility], [address] [place], [place] [address]  . ABO/RH(D) 12/10/2018 A POS   Final  . Antibody Screen 12/10/2018 NEG   Final  . Sample Expiration 12/10/2018  12/14/2018,2359   Final  . Extend sample reason 12/10/2018    Final                   Value:NO TRANSFUSIONS OR PREGNANCY IN THE PAST 3  MONTHS Performed at [facility], [address], [place], [place] [address]   . MRSA, PCR 12/10/2018 NEGATIVE  NEGATIVE Final  . Staphylococcus aureus 12/10/2018 NEGATIVE  NEGATIVE Final   Comment: (NOTE) The Xpert SA Assay (FDA approved for NASAL specimens in patients 41 years of age and older), is one component of a comprehensive surveillance program. It is not intended to diagnose infection nor to guide or monitor treatment. Performed at [facility], [address] [place], [place] [address]   . Glucose-Capillary 12/10/2018 172* 70 - 99 mg/dL Final  Hospital Outpatient Visit on 12/07/2018  Component Date Value Ref Range Status  . SARS Coronavirus 2 12/07/2018 NEGATIVE  NEGATIVE Final   Comment: (NOTE) SARS-CoV-2 target nucleic acids are NOT DETECTED. The SARS-CoV-2 RNA is generally detectable in upper and lower respiratory specimens during the acute phase of infection. Negative results do not preclude SARS-CoV-2 infection, do not rule out co-infections with other pathogens, and should not be used as the sole basis for treatment or other patient management decisions. Negative results must be combined with clinical observations, patient history, and epidemiological information. The expected result is Negative. Fact Sheet for Patients: HairSlick.no Fact Sheet for Healthcare Providers: quierodirigir.com This test is not yet approved or cleared by the [place] FDA and  has been authorized for detection and/or diagnosis of SARS-CoV-2 by FDA under an Emergency Use Authorization (EUA). This EUA will remain  in effect (meaning this test can be used) for the duration of the COVID-19 declaration under Section 56                          4(b)(1) of the Act,  21 U.S.C. section 360bbb-3(b)(1), unless the authorization is terminated or revoked sooner. Performed at [facility], [address] [place], [place] [address]      X-Rays:Dg Pelvis Portable  Result Date: 12/11/2018 CLINICAL DATA:  73 year old female status post right hip replacement. EXAM: PORTABLE PELVIS 1-2 VIEWS COMPARISON:  CT Abdomen and Pelvis 06/19/2016. FINDINGS: Portable AP supine view at 1110 hours. Preexisting left total hip arthroplasty. New right total hip arthroplasty hardware in place. Normal alignment in the AP view. No unexpected osseous changes. Mild regional postoperative soft tissue gas. Negative visible bowel gas pattern. IMPRESSION: Right total hip arthroplasty with no adverse features. Electronically Signed   By: Odessa Fleming M.D.   On: 12/11/2018 11:23   Dg C-arm 1-60 Min-no Report  Result Date: 12/11/2018 Fluoroscopy was utilized by the requesting physician.  No radiographic interpretation.   Dg Hip Operative Unilat W Or W/o Pelvis Right  Result Date: 12/11/2018 CLINICAL DATA:  73 year old female undergoing hip arthroplasty. EXAM: OPERATIVE RIGHT HIP (WITH PELVIS IF PERFORMED) 2 VIEWS TECHNIQUE: Fluoroscopic spot image(s) were submitted for interpretation post-operatively. COMPARISON:  [facility] 02/09/2011. FINDINGS: 2 intraoperative fluoroscopic spot views at 0901 hours. Preexisting left total hip arthroplasty. Right total hip arthroplasty underway, with normal alignment in the AP plane. No unexpected osseous changes. FLUOROSCOPY TIME:  0 minutes 9 seconds IMPRESSION: Right total hip arthroplasty with no adverse features. Electronically Signed   By: Odessa Fleming M.D.   On: 12/11/2018 10:28    EKG: Orders placed or performed during the hospital encounter of 12/10/18  . EKG 12 lead  . EKG 12 lead     Hospital Course: Anne Bryant is a 73 y.o. who was admitted to [facility]. They were brought to the  operating room on 12/11/2018 and  underwent Procedure(s): TOTAL HIP ARTHROPLASTY ANTERIOR APPROACH.  Patient tolerated the procedure well and was later transferred to the recovery room and then to the orthopaedic floor for postoperative care. They were given PO and IV analgesics for pain control following their surgery. They were given 24 hours of postoperative antibiotics of  Anti-infectives (From admission, onward)   Start     Dose/Rate Route Frequency Ordered Stop   12/11/18 1500  ceFAZolin (ANCEF) IVPB 2g/100 mL premix     2 g 200 mL/hr over 30 Minutes Intravenous Every 6 hours 12/11/18 1301 12/11/18 2325   12/11/18 0615  ceFAZolin (ANCEF) IVPB 2g/100 mL premix     2 g 200 mL/hr over 30 Minutes Intravenous On call to O.R. 12/11/18 [id number] 12/11/18 0906     and started on DVT prophylaxis in the form of Aspirin.   PT and OT were ordered for total joint protocol. Discharge planning consulted to help with postop disposition and equipment needs.  Patient had a good night on the evening of surgery. They started to get up OOB with therapy on POD #0. Pt was seen during rounds and was ready to go home pending progress with therapy. She worked with therapy on POD #1 and was meeting her goals. Pt was discharged to home later that day in stable condition.  Diet: Diabetic diet Activity: WBAT Follow-up: in 2 weeks Disposition: Home Discharged Condition: good   Discharge Instructions    Call MD / Call 911   Complete by: As directed    If you experience chest pain or shortness of breath, CALL 911 and be transported to the hospital emergency room.  If you develope a fever above 101 F, pus (white drainage) or increased drainage or redness at the wound, or calf pain, call your surgeon's office.   Change dressing   Complete by: As directed    Maintain surgical dressing until follow up in the clinic. If the edges start to pull up, may reinforce with tape. If the dressing is no longer working, may remove and cover with gauze and tape, but must  keep the area dry and clean.  Call with any questions or concerns.   Constipation Prevention   Complete by: As directed    Drink plenty of fluids.  Prune juice may be helpful.  You may use a stool softener, such as Colace (over the counter) 100 mg twice a day.  Use MiraLax (over the counter) for constipation as needed.   Diet - low sodium heart healthy   Complete by: As directed    Discharge instructions   Complete by: As directed    Maintain surgical dressing until follow up in the clinic. If the edges start to pull up, may reinforce with tape. If the dressing is no longer working, may remove and cover with gauze and tape, but must keep the area dry and clean.  Follow [facility] in 2 weeks at [facility]. Call with any questions or concerns.   Increase activity slowly as tolerated   Complete by: As directed    Weight bearing as tolerated with assist device (walker, cane, etc) as directed, use it as long as suggested by your surgeon or therapist, typically at least 4-6 weeks.   TED hose   Complete by: As directed    Use stockings (TED hose) for 2 weeks on both leg(s).  You may remove them at night for sleeping.     Allergies as of 12/12/2018  Reactions   Escitalopram Oxalate Other (See Comments)   Confusion    Azithromycin Rash, Other (See Comments)   REACTION: makes pt feel funny   Clarithromycin Nausea And Vomiting   Doxycycline Diarrhea, Other (See Comments)   Stomach issues   Fluoxetine Other (See Comments)   Irritability, anger   Morphine And Related Other (See Comments)   headaches   Pregabalin Other (See Comments)   REACTION: dizzy, weakness   Sertraline Hcl Other (See Comments)   DIDN'T WORK FOR PATIENT   Tetanus Toxoid Adsorbed Other (See Comments)      Medication List    STOP taking these medications   oxyCODONE-acetaminophen 10-325 MG tablet Commonly known as: PERCOCET     TAKE these medications   amitriptyline 25 MG tablet Commonly known as: ELAVIL  Take 50 mg by mouth at bedtime.   aspirin 81 MG chewable tablet Commonly known as: Aspirin Childrens Chew 1 tablet (81 mg total) by mouth 2 (two) times daily. Take for 4 weeks, then resume regular dose.   atorvastatin 10 MG tablet Commonly known as: LIPITOR Take 10 mg by mouth daily at 6 PM.   budesonide-formoterol 160-4.5 MCG/ACT inhaler Commonly known as: SYMBICORT Inhale 2 puffs into the lungs 2 (two) times daily.   cetirizine 10 MG tablet Commonly known as: ZYRTEC Take 10 mg by mouth every evening.   dicyclomine 20 MG tablet Commonly known as: BENTYL Take 20 mg by mouth 4 (four) times daily as needed for spasms.   docusate sodium 100 MG capsule Commonly known as: Colace Take 1 capsule (100 mg total) by mouth 2 (two) times daily.   DULoxetine 30 MG capsule Commonly known as: CYMBALTA Take 30 mg by mouth daily.   ferrous sulfate 325 (65 FE) MG tablet Commonly known as: FerrouSul Take 1 tablet (325 mg total) by mouth 3 (three) times daily with meals for 14 days.   furosemide 20 MG tablet Commonly known as: LASIX Take 20 mg by mouth daily as needed for fluid or edema.   glipiZIDE XL 5 MG 24 hr tablet Generic drug: glipiZIDE Take 5 mg by mouth daily before breakfast.   methocarbamol 500 MG tablet Commonly known as: Robaxin Take 1 tablet (500 mg total) by mouth every 6 (six) hours as needed for muscle spasms.   metoprolol succinate 50 MG 24 hr tablet Commonly known as: TOPROL-XL Take 50 mg by mouth at bedtime.   oxyCODONE 5 MG immediate release tablet Commonly known as: Oxy IR/ROXICODONE Take 1-2 tablets (5-10 mg total) by mouth every 4 (four) hours as needed for moderate pain or severe pain.   pantoprazole 40 MG tablet Commonly known as: PROTONIX Take 40 mg by mouth 2 (two) times daily.   polyethylene glycol 17 g packet Commonly known as: MIRALAX / GLYCOLAX Take 17 g by mouth 2 (two) times daily.   predniSONE 5 MG tablet Commonly known as: DELTASONE  Take 7.5 mg by mouth daily with breakfast.   SUMAtriptan 100 MG tablet Commonly known as: IMITREX Take 100 mg by mouth every 2 (two) hours as needed for migraine. May repeat in 2 hours if headache persists or recurs.   Ventolin HFA 108 (90 Base) MCG/ACT inhaler Generic drug: albuterol Inhale 2 puffs into the lungs every 6 (six) hours as needed for shortness of breath.   Vitamin D3 125 MCG (5000 UT) Caps Take 5,000 Units by mouth daily.            Discharge Care Instructions  (From admission,  onward)         Start     Ordered   12/12/18 0000  Change dressing    Comments: Maintain surgical dressing until follow up in the clinic. If the edges start to pull up, may reinforce with tape. If the dressing is no longer working, may remove and cover with gauze and tape, but must keep the area dry and clean.  Call with any questions or concerns.   12/12/18 0840         Follow-up Information    Durene Romans, MD. Schedule an appointment as soon as possible for a visit in 2 weeks.   Specialty: Orthopedic Surgery Contact information: [address] [place] [address] [phone]           Signed: Dennie Bible, PA-C Orthopedic Surgery 12/16/2018, 8:06 PM

## 2018-12-28 ENCOUNTER — Other Ambulatory Visit (HOSPITAL_COMMUNITY)
Admission: RE | Admit: 2018-12-28 | Discharge: 2018-12-28 | Disposition: A | Discharge status: Home / Self Care | Payer: Medicare Other | Source: Ambulatory Visit | Attending: Orthopedic Surgery | Admitting: Orthopedic Surgery

## 2018-12-28 DIAGNOSIS — Z20828 Contact with and (suspected) exposure to other viral communicable diseases: Secondary | ICD-10-CM | POA: Insufficient documentation

## 2018-12-28 DIAGNOSIS — Z01812 Encounter for preprocedural laboratory examination: Secondary | ICD-10-CM | POA: Insufficient documentation

## 2018-12-28 LAB — SARS CORONAVIRUS 2 (TAT 6-24 HRS): SARS Coronavirus 2: NEGATIVE

## 2018-12-31 ENCOUNTER — Encounter (HOSPITAL_COMMUNITY): Payer: Self-pay | Admitting: Emergency Medicine

## 2018-12-31 ENCOUNTER — Other Ambulatory Visit: Payer: Self-pay

## 2018-12-31 NOTE — Progress Notes (Signed)
lvmm for scheduler Anne Bryant requesting orders for surgery

## 2018-12-31 NOTE — Progress Notes (Signed)
Same day pre-op call completed.   PCP - Anne Bryant Cardiologist - Anne Bryant , lov see media 12-13-2018, cardiac clearance see media 12-13-2018  Chest x-ray - see media 12-13-2018 EKG - see media 12-13-2018 Stress Test - see media 12-13-2018 ECHO - see media 12-13-2018 Cardiac Cath -   Sleep Study -  CPAP - bipap , to bring mask and tubing   Fasting Blood Sugar - unaware Checks Blood Sugar __0___ times a day  Blood Thinner Instructions: patient reports she met with Dr  Anne Bryant on 8-19 and he is aware she is still taking her ASA 81 once daily   Aspirin Instructions:see above Last Dose: 12-30-2018  Anesthesia review:    Patient denies shortness of breath, fever, cough and chest pain at time of pre-op phone call    Patient verbalized understanding of instructions that were given to them at the PAT appointment. Patient was also instructed that they will need to review over the PAT instructions again at home before surgery.

## 2019-01-01 ENCOUNTER — Inpatient Hospital Stay (HOSPITAL_COMMUNITY): Payer: Medicare Other

## 2019-01-01 ENCOUNTER — Encounter (HOSPITAL_COMMUNITY): Payer: Self-pay | Admitting: *Deleted

## 2019-01-01 ENCOUNTER — Encounter (HOSPITAL_COMMUNITY)
Admission: RE | Disposition: A | Discharge status: Skilled Nursing Facility | Payer: Self-pay | Source: Home / Self Care | Attending: Orthopedic Surgery

## 2019-01-01 ENCOUNTER — Inpatient Hospital Stay (HOSPITAL_COMMUNITY): Payer: Medicare Other | Admitting: Certified Registered Nurse Anesthetist

## 2019-01-01 ENCOUNTER — Inpatient Hospital Stay (HOSPITAL_COMMUNITY)
Admission: RE | Admit: 2019-01-01 | Discharge: 2019-01-11 | DRG: 467 | Disposition: A | Discharge status: Skilled Nursing Facility | Payer: Medicare Other | Attending: Orthopedic Surgery | Admitting: Orthopedic Surgery

## 2019-01-01 DIAGNOSIS — Z96649 Presence of unspecified artificial hip joint: Secondary | ICD-10-CM

## 2019-01-01 DIAGNOSIS — Z96643 Presence of artificial hip joint, bilateral: Secondary | ICD-10-CM | POA: Diagnosis present

## 2019-01-01 DIAGNOSIS — Z96641 Presence of right artificial hip joint: Secondary | ICD-10-CM

## 2019-01-01 DIAGNOSIS — E785 Hyperlipidemia, unspecified: Secondary | ICD-10-CM | POA: Diagnosis present

## 2019-01-01 DIAGNOSIS — I10 Essential (primary) hypertension: Secondary | ICD-10-CM | POA: Diagnosis present

## 2019-01-01 DIAGNOSIS — T84020A Dislocation of internal right hip prosthesis, initial encounter: Secondary | ICD-10-CM | POA: Diagnosis not present

## 2019-01-01 DIAGNOSIS — M96661 Fracture of femur following insertion of orthopedic implant, joint prosthesis, or bone plate, right leg: Secondary | ICD-10-CM | POA: Diagnosis present

## 2019-01-01 DIAGNOSIS — Y92239 Unspecified place in hospital as the place of occurrence of the external cause: Secondary | ICD-10-CM | POA: Diagnosis not present

## 2019-01-01 DIAGNOSIS — Z7984 Long term (current) use of oral hypoglycemic drugs: Secondary | ICD-10-CM | POA: Diagnosis not present

## 2019-01-01 DIAGNOSIS — F419 Anxiety disorder, unspecified: Secondary | ICD-10-CM | POA: Diagnosis present

## 2019-01-01 DIAGNOSIS — Y792 Prosthetic and other implants, materials and accessory orthopedic devices associated with adverse incidents: Secondary | ICD-10-CM | POA: Diagnosis not present

## 2019-01-01 DIAGNOSIS — M069 Rheumatoid arthritis, unspecified: Secondary | ICD-10-CM | POA: Diagnosis present

## 2019-01-01 DIAGNOSIS — Z881 Allergy status to other antibiotic agents status: Secondary | ICD-10-CM | POA: Diagnosis not present

## 2019-01-01 DIAGNOSIS — K219 Gastro-esophageal reflux disease without esophagitis: Secondary | ICD-10-CM | POA: Diagnosis not present

## 2019-01-01 DIAGNOSIS — Z9841 Cataract extraction status, right eye: Secondary | ICD-10-CM

## 2019-01-01 DIAGNOSIS — Z20828 Contact with and (suspected) exposure to other viral communicable diseases: Secondary | ICD-10-CM | POA: Diagnosis not present

## 2019-01-01 DIAGNOSIS — M1990 Unspecified osteoarthritis, unspecified site: Secondary | ICD-10-CM | POA: Diagnosis present

## 2019-01-01 DIAGNOSIS — G4733 Obstructive sleep apnea (adult) (pediatric): Secondary | ICD-10-CM | POA: Diagnosis present

## 2019-01-01 DIAGNOSIS — Z7982 Long term (current) use of aspirin: Secondary | ICD-10-CM

## 2019-01-01 DIAGNOSIS — Z888 Allergy status to other drugs, medicaments and biological substances status: Secondary | ICD-10-CM | POA: Diagnosis not present

## 2019-01-01 DIAGNOSIS — F411 Generalized anxiety disorder: Secondary | ICD-10-CM | POA: Diagnosis present

## 2019-01-01 DIAGNOSIS — Z885 Allergy status to narcotic agent status: Secondary | ICD-10-CM | POA: Diagnosis not present

## 2019-01-01 DIAGNOSIS — D62 Acute posthemorrhagic anemia: Secondary | ICD-10-CM | POA: Diagnosis not present

## 2019-01-01 DIAGNOSIS — Z9049 Acquired absence of other specified parts of digestive tract: Secondary | ICD-10-CM

## 2019-01-01 DIAGNOSIS — M9701XA Periprosthetic fracture around internal prosthetic right hip joint, initial encounter: Secondary | ICD-10-CM | POA: Diagnosis not present

## 2019-01-01 DIAGNOSIS — Z9842 Cataract extraction status, left eye: Secondary | ICD-10-CM | POA: Diagnosis not present

## 2019-01-01 DIAGNOSIS — M25351 Other instability, right hip: Secondary | ICD-10-CM | POA: Diagnosis not present

## 2019-01-01 DIAGNOSIS — Z419 Encounter for procedure for purposes other than remedying health state, unspecified: Secondary | ICD-10-CM

## 2019-01-01 DIAGNOSIS — Z79891 Long term (current) use of opiate analgesic: Secondary | ICD-10-CM

## 2019-01-01 DIAGNOSIS — J45909 Unspecified asthma, uncomplicated: Secondary | ICD-10-CM | POA: Diagnosis present

## 2019-01-01 DIAGNOSIS — E119 Type 2 diabetes mellitus without complications: Secondary | ICD-10-CM | POA: Diagnosis not present

## 2019-01-01 DIAGNOSIS — E669 Obesity, unspecified: Secondary | ICD-10-CM | POA: Diagnosis not present

## 2019-01-01 DIAGNOSIS — Z87891 Personal history of nicotine dependence: Secondary | ICD-10-CM

## 2019-01-01 DIAGNOSIS — Z7951 Long term (current) use of inhaled steroids: Secondary | ICD-10-CM

## 2019-01-01 DIAGNOSIS — Z96653 Presence of artificial knee joint, bilateral: Secondary | ICD-10-CM | POA: Diagnosis present

## 2019-01-01 DIAGNOSIS — Z6835 Body mass index (BMI) 35.0-35.9, adult: Secondary | ICD-10-CM | POA: Diagnosis not present

## 2019-01-01 DIAGNOSIS — F329 Major depressive disorder, single episode, unspecified: Secondary | ICD-10-CM | POA: Diagnosis present

## 2019-01-01 DIAGNOSIS — M62838 Other muscle spasm: Secondary | ICD-10-CM | POA: Diagnosis not present

## 2019-01-01 DIAGNOSIS — Z79899 Other long term (current) drug therapy: Secondary | ICD-10-CM

## 2019-01-01 HISTORY — PX: TOTAL HIP REVISION: SHX763

## 2019-01-01 LAB — BASIC METABOLIC PANEL
Anion gap: 11 (ref 5–15)
BUN: 14 mg/dL (ref 8–23)
CO2: 22 mmol/L (ref 22–32)
Calcium: 8.9 mg/dL (ref 8.9–10.3)
Chloride: 107 mmol/L (ref 98–111)
Creatinine, Ser: 0.89 mg/dL (ref 0.44–1.00)
GFR calc Af Amer: >60 mL/min (ref >60)
GFR calc non Af Amer: >60 mL/min (ref >60)
Glucose, Bld: 129 mg/dL — ABNORMAL HIGH (ref 70–99)
Potassium: 5.1 mmol/L (ref 3.5–5.1)
Sodium: 140 mmol/L (ref 135–145)

## 2019-01-01 LAB — CBC
HCT: 41.1 % (ref 36.0–46.0)
Hemoglobin: 11.7 g/dL — ABNORMAL LOW (ref 12.0–15.0)
MCH: 21.5 pg — ABNORMAL LOW (ref 26.0–34.0)
MCHC: 28.5 g/dL — ABNORMAL LOW (ref 30.0–36.0)
MCV: 75.7 fL — ABNORMAL LOW (ref 80.0–100.0)
Platelets: 272 10*3/uL (ref 150–400)
RBC: 5.43 MIL/uL — ABNORMAL HIGH (ref 3.87–5.11)
RDW: 19 % — ABNORMAL HIGH (ref 11.5–15.5)
WBC: 9.2 10*3/uL (ref 4.0–10.5)
nRBC: 0 % (ref 0.0–0.2)

## 2019-01-01 LAB — TYPE AND SCREEN
ABO/RH(D): A POS
Antibody Screen: NEGATIVE

## 2019-01-01 LAB — GLUCOSE, CAPILLARY
Glucose-Capillary: 109 mg/dL — ABNORMAL HIGH (ref 70–99)
Glucose-Capillary: 119 mg/dL — ABNORMAL HIGH (ref 70–99)
Glucose-Capillary: 227 mg/dL — ABNORMAL HIGH (ref 70–99)

## 2019-01-01 SURGERY — TOTAL HIP REVISION
Anesthesia: Spinal | Site: Hip | Laterality: Right

## 2019-01-01 MED ORDER — POLYETHYLENE GLYCOL 3350 17 G PO PACK
17.0000 g | PACK | Freq: Two times a day (BID) | ORAL | Status: DC
  Administered 2019-01-05 – 2019-01-10 (×2): 17 g via ORAL
  Filled 2019-01-01 (×14): qty 1

## 2019-01-01 MED ORDER — PROPOFOL 10 MG/ML IV BOLUS
INTRAVENOUS | Status: AC
  Filled 2019-01-01: qty 20

## 2019-01-01 MED ORDER — DICYCLOMINE HCL 10 MG PO CAPS: 10.0000 mg | ORAL_CAPSULE | Freq: Four times a day (QID) | ORAL | Status: DC

## 2019-01-01 MED ORDER — MIDAZOLAM HCL 5 MG/5ML IJ SOLN
INTRAMUSCULAR | Status: DC | PRN
  Administered 2019-01-01: 2 mg via INTRAVENOUS

## 2019-01-01 MED ORDER — MIDAZOLAM HCL 2 MG/2ML IJ SOLN
INTRAMUSCULAR | Status: AC
  Filled 2019-01-01: qty 2

## 2019-01-01 MED ORDER — TRANEXAMIC ACID-NACL 1000-0.7 MG/100ML-% IV SOLN
1000.0000 mg | INTRAVENOUS | Status: AC
  Administered 2019-01-01: 14:00:00 1000 mg via INTRAVENOUS
  Filled 2019-01-01: qty 100

## 2019-01-01 MED ORDER — FERROUS SULFATE 325 (65 FE) MG PO TABS
325.0000 mg | ORAL_TABLET | Freq: Three times a day (TID) | ORAL | Status: DC
  Administered 2019-01-02 – 2019-01-11 (×28): 325 mg via ORAL
  Filled 2019-01-01 (×28): qty 1

## 2019-01-01 MED ORDER — METHOCARBAMOL 500 MG PO TABS
500.0000 mg | ORAL_TABLET | Freq: Four times a day (QID) | ORAL | Status: DC | PRN
  Administered 2019-01-01 – 2019-01-02 (×3): 500 mg via ORAL
  Filled 2019-01-01 (×3): qty 1

## 2019-01-01 MED ORDER — INSULIN ASPART 100 UNIT/ML ~~LOC~~ SOLN
0.0000 [IU] | Freq: Three times a day (TID) | SUBCUTANEOUS | Status: DC
  Administered 2019-01-02: 3 [IU] via SUBCUTANEOUS
  Administered 2019-01-02: 2 [IU] via SUBCUTANEOUS
  Administered 2019-01-02 – 2019-01-06 (×7): 3 [IU] via SUBCUTANEOUS
  Administered 2019-01-07: 16:00:00 2 [IU] via SUBCUTANEOUS
  Administered 2019-01-08 (×2): 3 [IU] via SUBCUTANEOUS
  Administered 2019-01-08: 5 [IU] via SUBCUTANEOUS
  Administered 2019-01-09: 3 [IU] via SUBCUTANEOUS
  Administered 2019-01-09: 2 [IU] via SUBCUTANEOUS
  Administered 2019-01-09: 3 [IU] via SUBCUTANEOUS
  Administered 2019-01-10 – 2019-01-11 (×2): 2 [IU] via SUBCUTANEOUS

## 2019-01-01 MED ORDER — EPHEDRINE 5 MG/ML INJ
INTRAVENOUS | Status: AC
  Filled 2019-01-01: qty 10

## 2019-01-01 MED ORDER — DOCUSATE SODIUM 100 MG PO CAPS
100.0000 mg | ORAL_CAPSULE | Freq: Two times a day (BID) | ORAL | Status: DC
  Administered 2019-01-01 – 2019-01-06 (×3): 100 mg via ORAL
  Filled 2019-01-01 (×11): qty 1

## 2019-01-01 MED ORDER — METOPROLOL SUCCINATE ER 50 MG PO TB24
50.0000 mg | ORAL_TABLET | Freq: Every day | ORAL | Status: DC
  Administered 2019-01-01 – 2019-01-10 (×9): 50 mg via ORAL
  Filled 2019-01-01 (×10): qty 1

## 2019-01-01 MED ORDER — PROPOFOL 10 MG/ML IV BOLUS
INTRAVENOUS | Status: DC | PRN
  Administered 2019-01-01: 20 mg via INTRAVENOUS
  Administered 2019-01-01: 30 mg via INTRAVENOUS

## 2019-01-01 MED ORDER — PRIMIDONE 50 MG PO TABS
100.0000 mg | ORAL_TABLET | Freq: Every day | ORAL | Status: DC
  Administered 2019-01-01 – 2019-01-10 (×9): 100 mg via ORAL
  Filled 2019-01-01 (×11): qty 2

## 2019-01-01 MED ORDER — STERILE WATER FOR IRRIGATION IR SOLN
Status: DC | PRN
  Administered 2019-01-01: 2000 mL

## 2019-01-01 MED ORDER — METOCLOPRAMIDE HCL 5 MG PO TABS: 5.0000 mg | ORAL_TABLET | Freq: Three times a day (TID) | ORAL | Status: DC | PRN

## 2019-01-01 MED ORDER — METOCLOPRAMIDE HCL 5 MG/ML IJ SOLN: 5.0000 mg | Freq: Three times a day (TID) | INTRAMUSCULAR | Status: DC | PRN

## 2019-01-01 MED ORDER — HYDROMORPHONE HCL 1 MG/ML IJ SOLN
0.5000 mg | INTRAMUSCULAR | Status: DC | PRN
  Administered 2019-01-02 – 2019-01-03 (×2): 1 mg via INTRAVENOUS
  Filled 2019-01-01 (×2): qty 1

## 2019-01-01 MED ORDER — LACTATED RINGERS IV SOLN
INTRAVENOUS | Status: DC
  Administered 2019-01-01 (×2): via INTRAVENOUS

## 2019-01-01 MED ORDER — DEXAMETHASONE SODIUM PHOSPHATE 10 MG/ML IJ SOLN
10.0000 mg | Freq: Once | INTRAMUSCULAR | Status: AC
  Administered 2019-01-01: 14:00:00 10 mg via INTRAVENOUS

## 2019-01-01 MED ORDER — OXYCODONE HCL 5 MG PO TABS
15.0000 mg | ORAL_TABLET | ORAL | Status: DC | PRN
  Administered 2019-01-01 – 2019-01-11 (×35): 15 mg via ORAL
  Filled 2019-01-01 (×36): qty 3

## 2019-01-01 MED ORDER — CEFAZOLIN SODIUM-DEXTROSE 2-4 GM/100ML-% IV SOLN
2.0000 g | Freq: Four times a day (QID) | INTRAVENOUS | Status: AC
  Administered 2019-01-01 – 2019-01-02 (×2): 2 g via INTRAVENOUS
  Filled 2019-01-01 (×2): qty 100

## 2019-01-01 MED ORDER — CELECOXIB 200 MG PO CAPS
200.0000 mg | ORAL_CAPSULE | Freq: Two times a day (BID) | ORAL | Status: DC
  Administered 2019-01-01 – 2019-01-11 (×19): 200 mg via ORAL
  Filled 2019-01-01 (×19): qty 1

## 2019-01-01 MED ORDER — BUPIVACAINE IN DEXTROSE 0.75-8.25 % IT SOLN
INTRATHECAL | Status: DC | PRN
  Administered 2019-01-01: 2 mL via INTRATHECAL

## 2019-01-01 MED ORDER — CHLORHEXIDINE GLUCONATE 4 % EX LIQD: 60.0000 mL | Freq: Once | CUTANEOUS | Status: DC

## 2019-01-01 MED ORDER — ACETAMINOPHEN 500 MG PO TABS
1000.0000 mg | ORAL_TABLET | Freq: Four times a day (QID) | ORAL | Status: AC
  Administered 2019-01-01 – 2019-01-02 (×3): 1000 mg via ORAL
  Filled 2019-01-01 (×3): qty 2

## 2019-01-01 MED ORDER — MENTHOL 3 MG MT LOZG: 1.0000 | LOZENGE | OROMUCOSAL | Status: DC | PRN

## 2019-01-01 MED ORDER — LORATADINE 10 MG PO TABS
10.0000 mg | ORAL_TABLET | Freq: Every day | ORAL | Status: DC
  Administered 2019-01-02 – 2019-01-11 (×10): 10 mg via ORAL
  Filled 2019-01-01 (×10): qty 1

## 2019-01-01 MED ORDER — ASPIRIN 81 MG PO CHEW
81.0000 mg | CHEWABLE_TABLET | Freq: Two times a day (BID) | ORAL | Status: DC
  Administered 2019-01-01 – 2019-01-11 (×19): 81 mg via ORAL
  Filled 2019-01-01 (×20): qty 1

## 2019-01-01 MED ORDER — PANTOPRAZOLE SODIUM 40 MG PO TBEC
40.0000 mg | DELAYED_RELEASE_TABLET | Freq: Two times a day (BID) | ORAL | Status: DC
  Administered 2019-01-01 – 2019-01-11 (×19): 40 mg via ORAL
  Filled 2019-01-01 (×20): qty 1

## 2019-01-01 MED ORDER — PROPOFOL 10 MG/ML IV BOLUS
INTRAVENOUS | Status: AC
  Filled 2019-01-01: qty 40

## 2019-01-01 MED ORDER — OXYCODONE HCL 5 MG PO TABS: 20.0000 mg | ORAL_TABLET | ORAL | Status: DC | PRN

## 2019-01-01 MED ORDER — PROPOFOL 500 MG/50ML IV EMUL
INTRAVENOUS | Status: DC | PRN
  Administered 2019-01-01: 100 ug/kg/min via INTRAVENOUS

## 2019-01-01 MED ORDER — GLIPIZIDE ER 5 MG PO TB24
5.0000 mg | ORAL_TABLET | Freq: Every day | ORAL | Status: DC
  Administered 2019-01-02 – 2019-01-11 (×10): 5 mg via ORAL
  Filled 2019-01-01 (×10): qty 1

## 2019-01-01 MED ORDER — ONDANSETRON HCL 4 MG/2ML IJ SOLN: 4.0000 mg | Freq: Four times a day (QID) | INTRAMUSCULAR | Status: DC | PRN

## 2019-01-01 MED ORDER — ONDANSETRON HCL 4 MG/2ML IJ SOLN
INTRAMUSCULAR | Status: DC | PRN
  Administered 2019-01-01: 4 mg via INTRAVENOUS

## 2019-01-01 MED ORDER — HYDROMORPHONE HCL 1 MG/ML IJ SOLN
0.2500 mg | INTRAMUSCULAR | Status: DC | PRN
  Administered 2019-01-01 (×2): 0.5 mg via INTRAVENOUS

## 2019-01-01 MED ORDER — MAGNESIUM CITRATE PO SOLN: 1.0000 | Freq: Once | ORAL | Status: DC | PRN

## 2019-01-01 MED ORDER — FENTANYL CITRATE (PF) 100 MCG/2ML IJ SOLN
INTRAMUSCULAR | Status: DC | PRN
  Administered 2019-01-01 (×2): 50 ug via INTRAVENOUS

## 2019-01-01 MED ORDER — ONDANSETRON HCL 4 MG PO TABS: 4.0000 mg | ORAL_TABLET | Freq: Four times a day (QID) | ORAL | Status: DC | PRN

## 2019-01-01 MED ORDER — SODIUM CHLORIDE 0.9 % IV SOLN
INTRAVENOUS | Status: DC | PRN
  Administered 2019-01-01: 14:00:00 40 ug/min via INTRAVENOUS

## 2019-01-01 MED ORDER — ATORVASTATIN CALCIUM 10 MG PO TABS
10.0000 mg | ORAL_TABLET | Freq: Every day | ORAL | Status: DC
  Administered 2019-01-01 – 2019-01-10 (×9): 10 mg via ORAL
  Filled 2019-01-01 (×10): qty 1

## 2019-01-01 MED ORDER — HYDROMORPHONE HCL 1 MG/ML IJ SOLN
INTRAMUSCULAR | Status: AC
  Filled 2019-01-01: qty 1

## 2019-01-01 MED ORDER — ALBUMIN HUMAN 5 % IV SOLN
INTRAVENOUS | Status: DC | PRN
  Administered 2019-01-01: 14:00:00 via INTRAVENOUS

## 2019-01-01 MED ORDER — EPHEDRINE SULFATE-NACL 50-0.9 MG/10ML-% IV SOSY
PREFILLED_SYRINGE | INTRAVENOUS | Status: DC | PRN
  Administered 2019-01-01 (×2): 15 mg via INTRAVENOUS

## 2019-01-01 MED ORDER — PREDNISONE 5 MG PO TABS
7.5000 mg | ORAL_TABLET | Freq: Every day | ORAL | Status: DC
  Administered 2019-01-02 – 2019-01-11 (×10): 7.5 mg via ORAL
  Filled 2019-01-01 (×10): qty 2

## 2019-01-01 MED ORDER — PHENOL 1.4 % MT LIQD: 1.0000 | OROMUCOSAL | Status: DC | PRN

## 2019-01-01 MED ORDER — DEXAMETHASONE SODIUM PHOSPHATE 10 MG/ML IJ SOLN
10.0000 mg | Freq: Once | INTRAMUSCULAR | Status: AC
  Administered 2019-01-07: 10 mg via INTRAVENOUS
  Filled 2019-01-01: qty 1

## 2019-01-01 MED ORDER — OXYCODONE HCL 5 MG PO TABS
10.0000 mg | ORAL_TABLET | ORAL | Status: DC | PRN
  Administered 2019-01-06: 5 mg via ORAL
  Administered 2019-01-06 – 2019-01-08 (×3): 10 mg via ORAL
  Filled 2019-01-01 (×4): qty 2

## 2019-01-01 MED ORDER — MEPERIDINE HCL 50 MG/ML IJ SOLN: 6.2500 mg | INTRAMUSCULAR | Status: DC | PRN

## 2019-01-01 MED ORDER — ALBUTEROL SULFATE (2.5 MG/3ML) 0.083% IN NEBU: 2.5000 mg | INHALATION_SOLUTION | Freq: Four times a day (QID) | RESPIRATORY_TRACT | Status: DC | PRN

## 2019-01-01 MED ORDER — PHENYLEPHRINE 40 MCG/ML (10ML) SYRINGE FOR IV PUSH (FOR BLOOD PRESSURE SUPPORT)
PREFILLED_SYRINGE | INTRAVENOUS | Status: DC | PRN
  Administered 2019-01-01: 120 ug via INTRAVENOUS
  Administered 2019-01-01: 80 ug via INTRAVENOUS

## 2019-01-01 MED ORDER — TRANEXAMIC ACID-NACL 1000-0.7 MG/100ML-% IV SOLN
1000.0000 mg | Freq: Once | INTRAVENOUS | Status: AC
  Administered 2019-01-01: 1000 mg via INTRAVENOUS
  Filled 2019-01-01: qty 100

## 2019-01-01 MED ORDER — FUROSEMIDE 20 MG PO TABS: 20.0000 mg | ORAL_TABLET | Freq: Every day | ORAL | Status: DC | PRN

## 2019-01-01 MED ORDER — MOMETASONE FURO-FORMOTEROL FUM 200-5 MCG/ACT IN AERO
2.0000 | INHALATION_SPRAY | Freq: Two times a day (BID) | RESPIRATORY_TRACT | Status: DC
  Administered 2019-01-01 – 2019-01-11 (×20): 2 via RESPIRATORY_TRACT
  Filled 2019-01-01: qty 8.8

## 2019-01-01 MED ORDER — BUPROPION HCL ER (XL) 150 MG PO TB24
150.0000 mg | ORAL_TABLET | Freq: Every day | ORAL | Status: DC
  Administered 2019-01-02 – 2019-01-11 (×10): 150 mg via ORAL
  Filled 2019-01-01 (×10): qty 1

## 2019-01-01 MED ORDER — SODIUM CHLORIDE 0.9 % IR SOLN
Status: DC | PRN
  Administered 2019-01-01: 1000 mL

## 2019-01-01 MED ORDER — BISACODYL 10 MG RE SUPP: 10.0000 mg | Freq: Every day | RECTAL | Status: DC | PRN

## 2019-01-01 MED ORDER — SUMATRIPTAN SUCCINATE 50 MG PO TABS
100.0000 mg | ORAL_TABLET | ORAL | Status: DC | PRN
  Filled 2019-01-01: qty 2

## 2019-01-01 MED ORDER — ALUM & MAG HYDROXIDE-SIMETH 200-200-20 MG/5ML PO SUSP: 15.0000 mL | ORAL | Status: DC | PRN

## 2019-01-01 MED ORDER — CEFAZOLIN SODIUM-DEXTROSE 2-4 GM/100ML-% IV SOLN
2.0000 g | INTRAVENOUS | Status: AC
  Administered 2019-01-01: 2 g via INTRAVENOUS
  Filled 2019-01-01: qty 100

## 2019-01-01 MED ORDER — ONDANSETRON HCL 4 MG/2ML IJ SOLN
INTRAMUSCULAR | Status: AC
  Filled 2019-01-01: qty 2

## 2019-01-01 MED ORDER — SODIUM CHLORIDE 0.9 % IV SOLN
INTRAVENOUS | Status: DC
  Administered 2019-01-01: 20:00:00 via INTRAVENOUS

## 2019-01-01 MED ORDER — DIPHENHYDRAMINE HCL 12.5 MG/5ML PO ELIX
12.5000 mg | ORAL_SOLUTION | ORAL | Status: DC | PRN
  Filled 2019-01-01: qty 10

## 2019-01-01 MED ORDER — ONDANSETRON HCL 4 MG/2ML IJ SOLN: 4.0000 mg | Freq: Once | INTRAMUSCULAR | Status: DC | PRN

## 2019-01-01 MED ORDER — FENTANYL CITRATE (PF) 100 MCG/2ML IJ SOLN
INTRAMUSCULAR | Status: AC
  Filled 2019-01-01: qty 2

## 2019-01-01 MED ORDER — METHOCARBAMOL 500 MG IVPB - SIMPLE MED
500.0000 mg | Freq: Four times a day (QID) | INTRAVENOUS | Status: DC | PRN
  Administered 2019-01-01: 16:00:00 500 mg via INTRAVENOUS
  Filled 2019-01-01: qty 50

## 2019-01-01 MED ORDER — METHOCARBAMOL 500 MG IVPB - SIMPLE MED
INTRAVENOUS | Status: AC
  Filled 2019-01-01: qty 50

## 2019-01-01 MED ORDER — DEXAMETHASONE SODIUM PHOSPHATE 10 MG/ML IJ SOLN
INTRAMUSCULAR | Status: AC
  Filled 2019-01-01: qty 1

## 2019-01-01 SURGICAL SUPPLY — 64 items
BAG DECANTER FOR FLEXI CONT (MISCELLANEOUS) ×2 IMPLANT
BAG ZIPLOCK 12X15 (MISCELLANEOUS) ×2 IMPLANT
BALL HIP CERAMIC (Hips) ×1 IMPLANT
BLADE SAW SGTL 11.0X1.19X90.0M (BLADE) IMPLANT
BLADE SAW SGTL 18X1.27X75 (BLADE) ×2 IMPLANT
BRUSH FEMORAL CANAL (MISCELLANEOUS) IMPLANT
CABLE CERLAGE W/CRIMP 1.8 (Cable) ×2 IMPLANT
COVER SURGICAL LIGHT HANDLE (MISCELLANEOUS) ×2 IMPLANT
COVER WAND RF STERILE (DRAPES) IMPLANT
DERMABOND ADVANCED (GAUZE/BANDAGES/DRESSINGS) ×1
DERMABOND ADVANCED .7 DNX12 (GAUZE/BANDAGES/DRESSINGS) ×1 IMPLANT
DRAPE ORTHO SPLIT 77X108 STRL (DRAPES) ×2
DRAPE POUCH INSTRU U-SHP 10X18 (DRAPES) ×2 IMPLANT
DRAPE SURG 17X11 SM STRL (DRAPES) ×2 IMPLANT
DRAPE SURG ORHT 6 SPLT 77X108 (DRAPES) ×2 IMPLANT
DRAPE U-SHAPE 47X51 STRL (DRAPES) ×2 IMPLANT
DRESSING AQUACEL AG SP 3.5X10 (GAUZE/BANDAGES/DRESSINGS) IMPLANT
DRSG AQUACEL AG ADV 3.5X10 (GAUZE/BANDAGES/DRESSINGS) IMPLANT
DRSG AQUACEL AG ADV 3.5X14 (GAUZE/BANDAGES/DRESSINGS) ×2 IMPLANT
DRSG AQUACEL AG SP 3.5X10 (GAUZE/BANDAGES/DRESSINGS)
DURAPREP 26ML APPLICATOR (WOUND CARE) ×2 IMPLANT
ELECT BLADE TIP CTD 4 INCH (ELECTRODE) ×2 IMPLANT
ELECT REM PT RETURN 15FT ADLT (MISCELLANEOUS) ×2 IMPLANT
FACESHIELD WRAPAROUND (MASK) ×8 IMPLANT
GAUZE SPONGE 2X2 8PLY STRL LF (GAUZE/BANDAGES/DRESSINGS) ×1 IMPLANT
GLOVE BIOGEL M 7.0 STRL (GLOVE) IMPLANT
GLOVE BIOGEL PI IND STRL 7.5 (GLOVE) ×1 IMPLANT
GLOVE BIOGEL PI IND STRL 8.5 (GLOVE) ×1 IMPLANT
GLOVE BIOGEL PI INDICATOR 7.5 (GLOVE) ×1
GLOVE BIOGEL PI INDICATOR 8.5 (GLOVE) ×1
GLOVE ECLIPSE 8.0 STRL XLNG CF (GLOVE) ×4 IMPLANT
GOWN STRL REUS W/TWL 2XL LVL3 (GOWN DISPOSABLE) ×2 IMPLANT
GOWN STRL REUS W/TWL LRG LVL3 (GOWN DISPOSABLE) ×2 IMPLANT
HANDPIECE INTERPULSE COAX TIP (DISPOSABLE)
HIP BALL CERAMIC (Hips) ×2 IMPLANT
KIT TURNOVER KIT A (KITS) IMPLANT
LINER PINN ALTRX ACE P4 HIP (Liner) ×2 IMPLANT
MANIFOLD NEPTUNE II (INSTRUMENTS) ×2 IMPLANT
MARKER SKIN DUAL TIP RULER LAB (MISCELLANEOUS) ×2 IMPLANT
NDL SAFETY ECLIPSE 18X1.5 (NEEDLE) ×1 IMPLANT
NEEDLE HYPO 18GX1.5 SHARP (NEEDLE) ×1
NS IRRIG 1000ML POUR BTL (IV SOLUTION) ×4 IMPLANT
PACK TOTAL JOINT (CUSTOM PROCEDURE TRAY) ×2 IMPLANT
PADDING CAST COTTON 6X4 STRL (CAST SUPPLIES) ×2 IMPLANT
PRESSURIZER FEMORAL UNIV (MISCELLANEOUS) IMPLANT
PROTECTOR NERVE ULNAR (MISCELLANEOUS) ×2 IMPLANT
SET HNDPC FAN SPRY TIP SCT (DISPOSABLE) IMPLANT
SPONGE GAUZE 2X2 STER 10/PKG (GAUZE/BANDAGES/DRESSINGS) ×1
SPONGE LAP 18X18 RF (DISPOSABLE) ×2 IMPLANT
SPONGE LAP 4X18 RFD (DISPOSABLE) ×2 IMPLANT
STAPLER VISISTAT 35W (STAPLE) ×2 IMPLANT
STEM FEM CMNTLSS SM AML 15.0 (Hips) ×2 IMPLANT
SUCTION FRAZIER HANDLE 10FR (MISCELLANEOUS) ×1
SUCTION TUBE FRAZIER 10FR DISP (MISCELLANEOUS) ×1 IMPLANT
SUT STRATAFIX PDS+ 0 24IN (SUTURE) ×2 IMPLANT
SUT VIC AB 1 CT1 36 (SUTURE) ×2 IMPLANT
SUT VIC AB 2-0 CT1 27 (SUTURE) ×3
SUT VIC AB 2-0 CT1 TAPERPNT 27 (SUTURE) ×3 IMPLANT
TOWEL OR 17X26 10 PK STRL BLUE (TOWEL DISPOSABLE) ×4 IMPLANT
TOWER CARTRIDGE SMART MIX (DISPOSABLE) IMPLANT
TRAY FOLEY MTR SLVR 14FR STAT (SET/KITS/TRAYS/PACK) ×2 IMPLANT
TUBE KAMVAC SUCTION (TUBING) IMPLANT
WATER STERILE IRR 1000ML POUR (IV SOLUTION) ×2 IMPLANT
YANKAUER SUCT BULB TIP 10FT TU (MISCELLANEOUS) ×2 IMPLANT

## 2019-01-01 NOTE — Progress Notes (Signed)
Patient noted to have home mask and tubing for nocturnal PAP use. She declines the use at this time and prefers to not utilize it but agrees to wear the supplemental oxygen for tonight. She states she is claustrophobic and usually has difficulty tolerating her home machine at night as well. She is aware that she may notify a member of her care team at any time if she wishes to use PAP tonight and the equipment will be brought to her bedside. However, she states she would like to have a machine tomorrow night when she feels a little more comfortable and will attempt to tolerate it for as long as she can. Order placed for nocturnal BiPAP as at home. RT will continue to follow.

## 2019-01-01 NOTE — Interval H&P Note (Signed)
History and Physical Interval Note:  01/01/2019 12:39 PM  Anne Bryant  has presented today for surgery, with the diagnosis of Right periprostetic femur fracture.  The various methods of treatment have been discussed with the patient and family. After consideration of risks, benefits and other options for treatment, the patient has consented to  Procedure(s) with comments: TOTAL HIP REVISION, [place] (Right) - 90 MINS as a surgical intervention.  The patient's history has been reviewed, patient examined, no change in status, stable for surgery.  I have reviewed the patient's chart and labs.  Questions were answered to the patient's satisfaction.     Mauri Pole

## 2019-01-01 NOTE — Anesthesia Postprocedure Evaluation (Signed)
Anesthesia Post Note  Patient: Jaymee M Bye  Procedure(s) Performed: TOTAL HIP REVISION, FEMORAL REVISION, POSTERIOR APPROACH (Right Hip)     Patient location during evaluation: PACU Anesthesia Type: Spinal Level of consciousness: oriented and awake and alert Pain management: pain level controlled Vital Signs Assessment: post-procedure vital signs reviewed and stable Respiratory status: spontaneous breathing, respiratory function stable and patient connected to nasal cannula oxygen Cardiovascular status: blood pressure returned to baseline and stable Postop Assessment: no headache, no backache and no apparent nausea or vomiting Anesthetic complications: no    Last Vitals:  Vitals:   01/01/19 1630 01/01/19 1645  BP: (!) 142/74 115/87  Pulse: 62 (!) 59  Resp: 14 13  Temp:  36.6 C  SpO2: 100% 100%    Last Pain:  Vitals:   01/01/19 1645  TempSrc:   PainSc: 3                  Samika Vetsch DAVID

## 2019-01-01 NOTE — Transfer of Care (Signed)
Immediate Anesthesia Transfer of Care Note  Patient: Anne Bryant  Procedure(s) Performed: TOTAL HIP REVISION, FEMORAL REVISION, POSTERIOR APPROACH (Right Hip)  Patient Location: PACU  Anesthesia Type:MAC and Spinal  Level of Consciousness: awake, alert , oriented and patient cooperative  Airway & Oxygen Therapy: Patient Spontanous Breathing and Patient connected to face mask oxygen  Post-op Assessment: Report given to RN and Post -op Vital signs reviewed and stable  Post vital signs: Reviewed and stable  Last Vitals:  Vitals Value Taken Time  BP 120/65 01/01/19 1600  Temp    Pulse 62 01/01/19 1603  Resp 11 01/01/19 1603  SpO2 100 % 01/01/19 1603  Vitals shown include unvalidated device data.  Last Pain:  Vitals:   01/01/19 1213  TempSrc:   PainSc: 4       Patients Stated Pain Goal: 4 (33/43/56 8616)  Complications: No apparent anesthesia complications

## 2019-01-01 NOTE — Anesthesia Preprocedure Evaluation (Signed)
Anesthesia Evaluation  Patient identified by MRN, date of birth, ID band Patient awake    Reviewed: Allergy & Precautions, NPO status , Patient's Chart, lab work & pertinent test results  Airway Mallampati: I  TM Distance: >3 FB Neck ROM: Full    Dental   Pulmonary sleep apnea , former smoker,    Pulmonary exam normal        Cardiovascular hypertension, Pt. on medications Normal cardiovascular exam     Neuro/Psych Anxiety Depression    GI/Hepatic GERD  Medicated and Controlled,  Endo/Other  diabetes, Type 2, Oral Hypoglycemic Agents  Renal/GU      Musculoskeletal   Abdominal   Peds  Hematology   Anesthesia Other Findings   Reproductive/Obstetrics                             Anesthesia Physical Anesthesia Plan  ASA: II  Anesthesia Plan: Spinal   Post-op Pain Management:    Induction: Intravenous  PONV Risk Score and Plan: 2 and Ondansetron and Midazolam  Airway Management Planned: Simple Face Mask  Additional Equipment:   Intra-op Plan:   Post-operative Plan:   Informed Consent: I have reviewed the patients History and Physical, chart, labs and discussed the procedure including the risks, benefits and alternatives for the proposed anesthesia with the patient or authorized representative who has indicated his/her understanding and acceptance.       Plan Discussed with: CRNA and Surgeon  Anesthesia Plan Comments:         Anesthesia Quick Evaluation

## 2019-01-01 NOTE — Anesthesia Procedure Notes (Signed)
Spinal  Patient location during procedure: OR Start time: 01/01/2019 1:30 PM End time: 01/01/2019 1:32 PM Staffing Anesthesiologist: Lillia Abed, MD Performed: anesthesiologist  Preanesthetic Checklist Completed: patient identified, surgical consent, pre-op evaluation, timeout performed, IV checked, risks and benefits discussed and monitors and equipment checked Spinal Block Patient position: sitting Prep: DuraPrep Patient monitoring: blood pressure, continuous pulse ox, cardiac monitor and heart rate Approach: right paramedian Location: L3-4 Injection technique: single-shot Needle Needle type: Pencan  Needle gauge: 24 G Needle length: 9 cm Needle insertion depth: 5 cm

## 2019-01-01 NOTE — H&P (Signed)
TOTAL HIP REVISION ADMISSION H&P  Patient is admitted for right revision total hip arthroplasty, posterior approach.  Subjective:  Chief Complaint:   Right hip periprosthetic fracture  HPI: Anne Bryant, 73 y.o. female, has a history of pain and functional disability in the right hip 2 weeks after her index THA.  She denies any trauma.  X-rays after the index surgery look good, but upon return to the clinic x-rays were obtained which reveal the periprosthetic fracture.  Dr. Charlann Boxer discussed options with the patient and it was decided to return to the OR to revise the femoral component and probably using cable to reduce the fracture.   Onset of symptoms have been since surgery and the patient though they were associated with muscle spasms. Prior procedures on the right hip include arthroplasty per Dr. Charlann Boxer on 12/11/2018.   Patient currently rates pain in the right hip at 10 out of 10 with activity.  There is night pain, worsening of pain with activity and weight bearing, trendelenberg gait, pain that interfers with activities of daily living and pain with passive range of motion. Patient has evidence of periprosthetic fracture by imaging studies.  This condition presents safety issues increasing the risk of falls.    There is no current active infection.  Risks, benefits and expectations were discussed with the patient.  Risks including but not limited to the risk of anesthesia, blood clots, nerve damage, blood vessel damage, failure of the prosthesis, infection and up to and including death.  Patient understand the risks, benefits and expectations and wishes to proceed with surgery.    PCP:    Etta Quill, PA-C  D/C Plans:       Home   Post-op Meds:       No Rx given  Tranexamic Acid:      To be given - IV   Decadron:      Is to be given  FYI:      ASA             Oxycodone 10 bid-tid before surgery  DME:     Pt already has equipment   PT:        HEP  Pharmacy:       [facility]  Patient Active Problem List   Diagnosis Date Noted  . Status post right hip replacement 12/11/2018  . Chest pain 06/19/2016  . Major depressive disorder, recurrent episode, severe, without mention of psychotic behavior 02/15/2013  . GAD (generalized anxiety disorder) 02/15/2013  . DYSPNEA 06/23/2009  . CHEST PAIN-PRECORDIAL 06/23/2009  . Diabetes mellitus due to underlying condition (HCC) 06/19/2009  . HYPERLIPIDEMIA 06/19/2009  . LEUKOCYTOSIS 06/19/2009  . Anxiety state 06/19/2009  . PANIC ATTACK 06/19/2009  . DEPRESSION 06/19/2009  . MIGRAINE HEADACHE 06/19/2009  . Essential hypertension 06/19/2009  . Asthma 06/19/2009  . RHEUMATOID ARTHRITIS 06/19/2009  . OSTEOARTHRITIS 06/19/2009  . Fibromyalgia 06/19/2009  . LEG EDEMA 06/19/2009  . EPISTAXIS 06/19/2009  . DYSPNEA ON EXERTION 06/19/2009  . IMPAIRED FASTING GLUCOSE 06/19/2009   Past Medical History:  Diagnosis Date  . Arthritis   . Asthma   . Barrett's esophagus    PERSONAL HISTORY PER PATIENT   . Complication of anesthesia   . Diabetes mellitus without complication (HCC)    TYPE 2 , MONITORS CBGS AT HOME ; 12-31-2018  i dstopped checking it now , denies 3ps   . Diverticulitis   . Dyspnea    pulmonogist [facility]   . Dysrhythmia   .  Enlarged tonsils   . Fibromyalgia 2006  . GERD (gastroesophageal reflux disease)   . History of blood transfusion   . Hypertension   . Migraine   . Narrowing of airway    per patient report ; 12-31-2018 " i had a spina"   . Panic attack    per patient report  . Pneumonia 07/2018  . Renal insufficiency    per patient report, acute kidney injury when she had pneumonia march 2020 , reports back normal   . Sleep apnea    OSA WITH BIPAP     Past Surgical History:  Procedure Laterality Date  . CATARACTS  Bilateral   . CHOLECYSTECTOMY     WITH NAVEL HERNIA REPAIR   . ESOPHAGEAL DILATION  10/2017   SAME DAY DID COLONOSCOPY   . JOINT REPLACEMENT    . RETINAL  DETACHMENT SURGERY Right   . REVISION TOTAL HIP ARTHROPLASTY Left    "THEY HAD TO GO BACK IN A FIX MY FEMUR"   . ROTATOR CUFF REPAIR Left 2004  . TOTAL HIP ARTHROPLASTY Left   . TOTAL HIP ARTHROPLASTY Right 12/11/2018   Procedure: TOTAL HIP ARTHROPLASTY ANTERIOR APPROACH;  Surgeon: Paralee Cancel, MD;  Location: [facility];  Service: Orthopedics;  Laterality: Right;  . TOTAL KNEE ARTHROPLASTY Bilateral 2007, THEN 2012    No current facility-administered medications for this encounter.    Current Outpatient Medications  Medication Sig Dispense Refill Last Dose  . aspirin EC 81 MG tablet Take 81 mg by mouth every evening.     Marland Kitchen atorvastatin (LIPITOR) 10 MG tablet Take 10 mg by mouth at bedtime.      . budesonide-formoterol (SYMBICORT) 160-4.5 MCG/ACT inhaler Inhale 2 puffs into the lungs 2 (two) times daily.     Marland Kitchen buPROPion (WELLBUTRIN XL) 150 MG 24 hr tablet Take 150 mg by mouth daily.     . cetirizine (ZYRTEC) 10 MG tablet Take 10 mg by mouth at bedtime.      . dicyclomine (BENTYL) 10 MG capsule Take 10 mg by mouth 4 (four) times daily.     . ferrous sulfate (FERROUSUL) 325 (65 FE) MG tablet Take 1 tablet (325 mg total) by mouth 3 (three) times daily with meals for 14 days. 42 tablet 0   . furosemide (LASIX) 20 MG tablet Take 20 mg by mouth daily as needed for fluid or edema.      Marland Kitchen GLIPIZIDE XL 5 MG 24 hr tablet Take 5 mg by mouth daily before breakfast.     . methocarbamol (ROBAXIN) 500 MG tablet Take 1 tablet (500 mg total) by mouth every 6 (six) hours as needed for muscle spasms. 40 tablet 0   . metoprolol succinate (TOPROL-XL) 50 MG 24 hr tablet Take 50 mg by mouth at bedtime.      Marland Kitchen oxyCODONE-acetaminophen (PERCOCET) 10-325 MG tablet Take 1 tablet by mouth 2 (two) times daily.     . pantoprazole (PROTONIX) 40 MG tablet Take 40 mg by mouth 2 (two) times daily.     . predniSONE (DELTASONE) 5 MG tablet Take 7.5 mg by mouth daily with breakfast.     . primidone (MYSOLINE) 50 MG tablet Take 100  mg by mouth at bedtime.     . promethazine (PHENERGAN) 25 MG tablet Take 25 mg by mouth daily as needed for nausea/vomiting.     . SUMAtriptan (IMITREX) 100 MG tablet Take 100 mg by mouth every 2 (two) hours as needed for migraine. May repeat in  2 hours if headache persists or recurs.     . VENTOLIN HFA 108 (90 BASE) MCG/ACT inhaler Inhale 2 puffs into the lungs every 6 (six) hours as needed for shortness of breath.      Marland Kitchen aspirin (ASPIRIN CHILDRENS) 81 MG chewable tablet Chew 1 tablet (81 mg total) by mouth 2 (two) times daily. Take for 4 weeks, then resume regular dose. (Patient not taking: Reported on 12/28/2018) 60 tablet 0 Not Taking at Unknown time  . Cholecalciferol (VITAMIN D3) 125 MCG (5000 UT) CAPS Take 5,000 Units by mouth daily.     Marland Kitchen docusate sodium (COLACE) 100 MG capsule Take 1 capsule (100 mg total) by mouth 2 (two) times daily. (Patient not taking: Reported on 12/28/2018) 28 capsule 0 Not Taking at Unknown time  . oxyCODONE (OXY IR/ROXICODONE) 5 MG immediate release tablet Take 1-2 tablets (5-10 mg total) by mouth every 4 (four) hours as needed for moderate pain or severe pain. (Patient not taking: Reported on 12/28/2018) 60 tablet 0 Not Taking at Unknown time  . polyethylene glycol (MIRALAX / GLYCOLAX) 17 g packet Take 17 g by mouth 2 (two) times daily. (Patient not taking: Reported on 12/28/2018) 28 packet 0 Not Taking at Unknown time   Allergies  Allergen Reactions  . Escitalopram Oxalate Other (See Comments)    Confusion   . Azithromycin Rash and Other (See Comments)    makes pt feel funny  . Clarithromycin Nausea And Vomiting  . Doxycycline Diarrhea and Other (See Comments)    Stomach issues  . Fluoxetine Other (See Comments)    Irritability, anger  . Morphine And Related Other (See Comments)    headaches  . Pregabalin Other (See Comments)    dizzy, weakness   . Sertraline Hcl Other (See Comments)    DIDN'T WORK FOR PATIENT  . Tetanus Toxoid Adsorbed Other (See Comments)     Social History   Tobacco Use  . Smoking status: Former Smoker    Types: Cigarettes  . Smokeless tobacco: Never Used  . Tobacco comment: irregular use for months.  Substance Use Topics  . Alcohol use: No    Comment: Patient stopped.    Family History  Problem Relation Age of Onset  . Depression Mother   . Anxiety disorder Mother   . Bipolar disorder Neg Hx   . Schizophrenia Neg Hx   . Alcohol abuse Neg Hx   . Drug abuse Neg Hx   . OCD Neg Hx       Review of Systems  Constitutional: Negative.   HENT: Negative.   Eyes: Negative.   Respiratory: Negative.   Cardiovascular: Negative.   Gastrointestinal: Negative.   Genitourinary: Negative.   Musculoskeletal: Positive for joint pain.  Skin: Negative.   Neurological: Positive for headaches.  Endo/Heme/Allergies: Negative.   Psychiatric/Behavioral: Positive for depression. The patient is nervous/anxious.     Objective:  Physical Exam  Constitutional: She is oriented to person, place, and time. She appears well-developed.  HENT:  Head: Normocephalic.  Eyes: Pupils are equal, round, and reactive to light.  Neck: Neck supple. No JVD present. No tracheal deviation present. No thyromegaly present.  Cardiovascular: Normal rate, regular rhythm and intact distal pulses.  Respiratory: Effort normal and breath sounds normal. No respiratory distress. She has no wheezes.  GI: Soft. There is no abdominal tenderness. There is no guarding.  Musculoskeletal:     Right hip: She exhibits decreased range of motion, decreased strength, tenderness, bony tenderness, swelling and laceration (healing  previous incision). She exhibits no deformity.  Lymphadenopathy:    She has no cervical adenopathy.  Neurological: She is alert and oriented to person, place, and time.  Skin: Skin is warm and dry.  Psychiatric: She has a normal mood and affect.      Labs:  Estimated body mass index is 34.39 kg/m as calculated from the following:    Height as of this encounter: 5' 1.5" (1.562 m).   Weight as of this encounter: 83.9 kg.  Imaging Review:  Plain radiographs demonstrate periprosthetic proximal femur fx  of the right hip.  There is a previous THA.    Assessment/Plan:  Right hip periprosthetic femur fracture 2 weeks s/p THA.  The patient history, physical examination, clinical judgement of the provider and imaging studies are consistent with periprosthetic fx of the right hip(s), previous total hip arthroplasty. Revision total hip arthroplasty is deemed medically necessary. The treatment options including medical management, injection therapy, arthroscopy and arthroplasty were discussed at length. The risks and benefits of total hip arthroplasty were presented and reviewed. The risks due to aseptic loosening, infection, stiffness, dislocation/subluxation,  thromboembolic complications and other imponderables were discussed.  The patient acknowledged the explanation, agreed to proceed with the plan and consent was signed. Patient is being admitted for inpatient treatment for surgery, pain control, PT, OT, prophylactic antibiotics, VTE prophylaxis, progressive ambulation and ADL's and discharge planning. The patient is planning to be discharged home.    Anastasio AuerbachMatthew S. Jazmin Ley   PA-C  01/01/2019, 9:51 AM

## 2019-01-01 NOTE — Op Note (Signed)
NAME: Anne Bryant, [place] MEDICAL RECORD LD:[id number] ACCOUNT [id number] DATE OF BIRTH:1945/05/23 FACILITY: [facility] LOCATION: [facility] PHYSICIAN:Nancie Bocanegra DCharlann Boxer, MD  OPERATIVE REPORT  DATE OF PROCEDURE:  01/01/2019  PREOPERATIVE DIAGNOSIS:  Right periprosthetic femur fracture with failed femoral component with subsidence.  POSTOPERATIVE DIAGNOSIS:  Right periprosthetic femur fracture with failed femoral component with subsidence.  PROCEDURE:  Revision right total hip arthroplasty utilizing a DePuy AML stem, 15 mm small stature stem with a 32+5 delta ceramic ball, a single Zimmer cable around the lesser trochanter and below the lesser trochanter, as well as a new liner, a 50 x 32  mm +4 10-degree face-changing liner.  SURGEON:  Durene Romans, MD  ASSISTANT:  Dennie Bible, PA-C.  Note that Ms. Adrian Blackwater was present for the entirety of the case from preoperative positioning, perioperative management of the operative extremity, general facilitation of the case, and primary wound closure.  ANESTHESIA:  Spinal.  BLOOD LOSS:  700 mL.  DRAINS:  None.  COMPLICATIONS:  None apparent.  INDICATIONS:  The patient is a pleasant 73 year old female who underwent a primary total hip arthroplasty just 3 weeks ago.  Her initial postoperative course appeared to be uncomplicated.  She only reported perhaps feeling 1 twinge of pain when she  rolled over in bed one time.  Her postoperative radiographs looked like she had a very stable component.  However, when she came into the office and was complaining of a lot of anterior pain, we radiographed her at 2-week visit.  This indicated a  fracture that extended through the medial portion of her femoral neck into the subtrochanteric region.  Additionally, the femoral component was noted to have subsided.  I reviewed with her these findings as well as her postoperative x-rays.  It is not  certain as I cannot recall any specific event with her index surgery  and her postoperative radiographs that would indicate that there were any issues.  Nonetheless, we discussed the need for revision of the femoral component and attempt at placing a  cable around the lesser trochanteric segment to hold it in place.  Consent was obtained for management of the fracture pain relief and getting her a stable hip.    PROCEDURE IN DETAIL:  The patient was brought to the operative theater.  Once adequate anesthesia, preoperative antibiotics, and Ancef administered, she was positioned in the left lateral decubitus position with right hip up.  The right lower extremity  was then prepped and draped in sterile fashion.  A timeout was performed identifying the patient, the planned procedure and extremity.  A posterior approach with the hip was made.  Posterior exposure was obtained down to the iliotibial band and gluteal  fascia, allowing for exposure of the proximal femur for the cable placement.  The fascia was split.  The previous seroma from the anterior aspect of the hip was evacuated.  The posterior aspect of the hip was exposed.  The femoral head was removed.  I  then used the S-ROM loop extractor and removed the femoral stem.  The femur was then positioned in an internally rotated position and flexed, and retractors were placed to expose the proximal femur.  I began reaming with a 10 mm reamer and reamed up to  14.5 mm with good purchase distally.  I then broached with a 13.5 small body stem broach and then a 15 small body.  We did a trial reduction.  During this trial reduction, there was some evidence of impingement of anterior capsule, as  well as some  realization that the combined anteversion was about 30 degrees or so.  Given this, I elected to remove the acetabular liner and replace it with a 32+4, 10-degree face-changing liner with the lip portion at 9 o'clock for this right hip.  This was impacted  with good visualized rim fit.  The final 15 small stature stem was opened.   Then utilizing a reamer handle as a guide, it was impacted to a level where the broach was.  I did retrial reduction and selected a 32+5 ball.  The final 32+5 delta ceramic ball  was impacted on a clean and dried trunnion, and then the hip was reduced.  At this point, attention was turned to placement of a cable in the subtrochanteric region.  The cable was passed in routine fashion crimped.  This was an attempt to keep this  subtrochanteric segment in a near-anatomic position to allow for healing.  The hip had been irrigated throughout the case and again at this point.  We reapproximated the iliotibial band and gluteal fascia using #1 Vicryl and Stratafix suture.  The  remainder of the wound was closed with 2-0 Vicryl and a running Monocryl stitch.  The hip was cleaned, dried, and dressed sterilely using surgical glue and an Aquacel dressing.  She will be transferred to the recovery room and then to the floor.   Initially, we will allow her to be weightbearing as tolerated with posterior hip precautions.  We will assess her recovery status in the hospital over the next 2-3 days with inpatient physical therapy before determining whether or not she would need a  nursing facility or can be discharged home with home health physical therapy.  Findings were reviewed with her son.  LN/NUANCE  D:01/01/2019 T:01/01/2019 JOB:[id number]

## 2019-01-01 NOTE — Brief Op Note (Signed)
01/01/2019  1:32 PM  PATIENT:  Maanvi M Delone  73 y.o. female  PRE-OPERATIVE DIAGNOSIS:  Right periprostetic femur fracture  POST-OPERATIVE DIAGNOSIS:  Right periprostetic femur fracture  PROCEDURE:  Procedure(s) with comments: TOTAL HIP REVISION, FEMORAL REVISION, POSTERIOR APPROACH (Right) - 90 MINS  SURGEON:  Surgeon(s) and Role:    Paralee Cancel, MD - Primary  PHYSICIAN ASSISTANT: Griffith Citron, PA-C  ANESTHESIA:   spinal  EBL:  700 cc  BLOOD ADMINISTERED:none  DRAINS: none   LOCAL MEDICATIONS USED:  NONE  SPECIMEN:  No Specimen  DISPOSITION OF SPECIMEN:  N/A  COUNTS:  YES  TOURNIQUET:  * No tourniquets in log *  DICTATION: .Other Dictation: Dictation Number [id number]  PLAN OF CARE: Admit to inpatient   PATIENT DISPOSITION:  PACU - hemodynamically stable.   Delay start of Pharmacological VTE agent (>24hrs) due to surgical blood loss or risk of bleeding: no

## 2019-01-01 NOTE — Progress Notes (Addendum)
PT Cancellation Note  Patient Details Name: Anne Bryant MRN: [id number] DOB: 1945/05/30   Cancelled Treatment:    Reason Eval/Treat Not Completed: Medical issues which prohibited therapy - Pt on bedrest for this evening due to hip dislocation in PACU, since has been reduced. MD to reassess tomorrow. PT to check back tomorrow am.  Julien Girt, PT Acute Rehabilitation Services Pager [phone]  Office [place] 01/01/2019, 6:26 PM

## 2019-01-02 ENCOUNTER — Encounter (HOSPITAL_COMMUNITY): Payer: Self-pay | Admitting: Orthopedic Surgery

## 2019-01-02 DIAGNOSIS — E669 Obesity, unspecified: Secondary | ICD-10-CM | POA: Diagnosis present

## 2019-01-02 LAB — BASIC METABOLIC PANEL
Anion gap: 8 (ref 5–15)
BUN: 14 mg/dL (ref 8–23)
CO2: 25 mmol/L (ref 22–32)
Calcium: 8.5 mg/dL — ABNORMAL LOW (ref 8.9–10.3)
Chloride: 104 mmol/L (ref 98–111)
Creatinine, Ser: 0.68 mg/dL (ref 0.44–1.00)
GFR calc Af Amer: >60 mL/min (ref >60)
GFR calc non Af Amer: >60 mL/min (ref >60)
Glucose, Bld: 240 mg/dL — ABNORMAL HIGH (ref 70–99)
Potassium: 3.9 mmol/L (ref 3.5–5.1)
Sodium: 137 mmol/L (ref 135–145)

## 2019-01-02 LAB — GLUCOSE, CAPILLARY
Glucose-Capillary: 145 mg/dL — ABNORMAL HIGH (ref 70–99)
Glucose-Capillary: 177 mg/dL — ABNORMAL HIGH (ref 70–99)
Glucose-Capillary: 185 mg/dL — ABNORMAL HIGH (ref 70–99)
Glucose-Capillary: 179 mg/dL — ABNORMAL HIGH (ref 70–99)

## 2019-01-02 LAB — CBC
HCT: 30 % — ABNORMAL LOW (ref 36.0–46.0)
Hemoglobin: 8.8 g/dL — ABNORMAL LOW (ref 12.0–15.0)
MCH: 22 pg — ABNORMAL LOW (ref 26.0–34.0)
MCHC: 29.3 g/dL — ABNORMAL LOW (ref 30.0–36.0)
MCV: 75 fL — ABNORMAL LOW (ref 80.0–100.0)
Platelets: 211 10*3/uL (ref 150–400)
RBC: 4 MIL/uL (ref 3.87–5.11)
RDW: 17.7 % — ABNORMAL HIGH (ref 11.5–15.5)
WBC: 9 10*3/uL (ref 4.0–10.5)
nRBC: 0.2 % (ref 0.0–0.2)

## 2019-01-02 MED ORDER — CYCLOBENZAPRINE HCL 5 MG PO TABS
5.0000 mg | ORAL_TABLET | Freq: Three times a day (TID) | ORAL | Status: DC | PRN
  Administered 2019-01-02 – 2019-01-07 (×11): 10 mg via ORAL
  Administered 2019-01-07: 07:00:00 5 mg via ORAL
  Administered 2019-01-08 – 2019-01-11 (×8): 10 mg via ORAL
  Filled 2019-01-02 (×15): qty 2
  Filled 2019-01-02: qty 1
  Filled 2019-01-02 (×5): qty 2

## 2019-01-02 NOTE — Evaluation (Signed)
Physical Therapy Evaluation Patient Details Name: Anne Bryant MRN: [id number] DOB: March 26, 1946 Today's Date: 01/02/2019   History of Present Illness  Pt is a 73 year old female s/p Right DA THA 3 weeks ago and currently s/p right posterior approach femoral hip revision 01/01/19 due to Right periprosthetic femur fracture with failed femoral component with subsidence.  Clinical Impression  Patient is s/p above surgery resulting in functional limitations due to the deficits listed below (see PT Problem List).  Patient will benefit from skilled PT to increase their independence and safety with mobility to allow discharge to the venue listed below.  Pt requiring mod assist for OOB POD #1 and reports pain limiting mobility.  Pt also with posterior hip precautions.  Pt reports she was able to d/c to son's home last time however this is not a possibility this time. Pt may benefit from SNF upon d/c as she lives alone.     Follow Up Recommendations SNF    Equipment Recommendations  None recommended by PT    Recommendations for Other Services       Precautions / Restrictions Precautions Precautions: Fall;Posterior Hip Precaution Comments: provided handout and reviewed posterior hip precautions Required Braces or Orthoses: Knee Immobilizer - Right Knee Immobilizer - Right: (can be removed per PA notes, pt wished to leave on for first time OOB) Restrictions Weight Bearing Restrictions: Yes RLE Weight Bearing: Weight bearing as tolerated      Mobility  Bed Mobility Overal bed mobility: Needs Assistance Bed Mobility: Supine to Sit     Supine to sit: Min assist     General bed mobility comments: verbal cues for technique, assist for R LE  Transfers Overall transfer level: Needs assistance Equipment used: Rolling walker (2 wheeled) Transfers: Sit to/from [employer] Sit to Stand: Mod assist Stand pivot transfers: Min assist       General transfer comment:  verbal cues for UE and LE positioning; assist to rise and steady (presented with initial posterior lean), cues for small steps/turning to recliner while maintaining posterior hip precautions  Ambulation/Gait                Stairs            Wheelchair Mobility    Modified Rankin (Stroke Patients Only)       Balance                                             Pertinent Vitals/Pain Pain Assessment: 0-10 Pain Score: 8  Pain Location: right upper anterior thigh Pain Descriptors / Indicators: Squeezing("twisting muscle pain") Pain Intervention(s): Limited activity within patient's tolerance;Monitored during session;Repositioned;Premedicated before session;Heat applied(pt aware for heat only to thigh and not over any incisions)    Home Living Family/patient expects to be discharged to:: Private residence Living Arrangements: Alone           Home Layout: One level Home Equipment: Environmental consultant - 2 wheels;Walker - 4 wheels;Cane - single point;Tub bench Additional Comments: pt stayed at son's house upon d/c last admission however reports she must return home    Prior Function Level of Independence: Independent with assistive device(s);Needs assistance         Comments: pt reports gradually requiring more assist from family upon last d/c due to pain     Hand Dominance        Extremity/Trunk  Assessment        Lower Extremity Assessment Lower Extremity Assessment: RLE deficits/detail RLE Deficits / Details: ankle WFL; knee and hip grossly 2+/5, limited by pain; maintained precautions       Communication   Communication: No difficulties  Cognition Arousal/Alertness: Awake/alert Behavior During Therapy: WFL for tasks assessed/performed Overall Cognitive Status: Within Functional Limits for tasks assessed                                        General Comments      Exercises     Assessment/Plan    PT Assessment  Patient needs continued PT services  PT Problem List Decreased strength;Decreased activity tolerance;Decreased mobility;Decreased knowledge of use of DME;Pain;Decreased knowledge of precautions       PT Treatment Interventions DME instruction;Gait training;Functional mobility training;Therapeutic activities;Therapeutic exercise;Patient/family education;Stair training    PT Goals (Current goals can be found in the Care Plan section)  Acute Rehab PT Goals Patient Stated Goal: have right hip/thigh pain go away PT Goal Formulation: With patient Time For Goal Achievement: 01/16/19 Potential to Achieve Goals: Good    Frequency 7X/week   Barriers to discharge        Co-evaluation               AM-PAC PT "6 Clicks" Mobility  Outcome Measure Help needed turning from your back to your side while in a flat bed without using bedrails?: A Little Help needed moving from lying on your back to sitting on the side of a flat bed without using bedrails?: A Little Help needed moving to and from a bed to a chair (including a wheelchair)?: A Lot Help needed standing up from a chair using your arms (e.g., wheelchair or bedside chair)?: A Lot Help needed to walk in hospital room?: Total Help needed climbing 3-5 steps with a railing? : Total 6 Click Score: 12    End of Session Equipment Utilized During Treatment: Gait belt;Right knee immobilizer Activity Tolerance: Patient limited by pain Patient left: in chair;with call bell/phone within reach Nurse Communication: Mobility status PT Visit Diagnosis: Difficulty in walking, not elsewhere classified (R26.2)    Time: 1610-9604 PT Time Calculation (min) (ACUTE ONLY): 26 min   Charges:   PT Evaluation $PT Eval Low Complexity: 1 Low PT Treatments $Therapeutic Activity: 8-22 mins   Carmelia Bake, PT, DPT Acute Rehabilitation Services Office: [phone] Pager: [phone]  Trena Platt 01/02/2019, 1:09 PM

## 2019-01-02 NOTE — Progress Notes (Signed)
Physical Therapy Treatment Patient Details Name: Anne Bryant MRN: [id number] DOB: Nov 17, 1945 Today's Date: 01/02/2019    History of Present Illness Pt is a 73 year old female s/p Right DA THA 3 weeks ago and currently s/p right posterior approach femoral hip revision 01/01/19 due to Right periprosthetic femur fracture with failed femoral component with subsidence.    PT Comments    Pt premedicated and assisted with very short distance ambulation.  Pt reports warm packs to thigh was helpful earlier so reapplied upon return to bed.  Pt with increased pain/"digging/twisting" at anterior thigh and groin with returning to bed.  Pt cued and maintained posterior hip precautions during session.     Follow Up Recommendations  SNF;Follow surgeon's recommendation for DC plan and follow-up therapies(per pt MD wants home, pt alone, if not SNF, will need increased home care)     Equipment Recommendations  None recommended by PT    Recommendations for Other Services       Precautions / Restrictions Precautions Precautions: Fall;Posterior Hip Precaution Comments: reviewed posterior hip precautions Required Braces or Orthoses: Knee Immobilizer - Right Knee Immobilizer - Right: (can be removed per PA notes, pt wished to leave on for first time OOB) Restrictions RLE Weight Bearing: Weight bearing as tolerated    Mobility  Bed Mobility Overal bed mobility: Needs Assistance Bed Mobility: Sit to Supine     Supine to sit: Min assist Sit to supine: Mod assist   General bed mobility comments: verbal cues for technique, assist for LEs due to pain  Transfers Overall transfer level: Needs assistance Equipment used: Rolling walker (2 wheeled) Transfers: Sit to/from Stand Sit to Stand: Min assist Stand pivot transfers: Min assist       General transfer comment: verbal cues for UE and LE positioning; assist to rise and steady, cues for precautions  Ambulation/Gait Ambulation/Gait  assistance: Min assist;+2 safety/equipment Gait Distance (Feet): 18 Feet Assistive device: Rolling walker (2 wheeled) Gait Pattern/deviations: Step-to pattern;Decreased stance time - right;Decreased weight shift to right     General Gait Details: cues for sequence and RW position, pt with difficulty advancing R LE, cues for maintaining posterior hip precautions   Stairs             Wheelchair Mobility    Modified Rankin (Stroke Patients Only)       Balance                                            Cognition Arousal/Alertness: Awake/alert Behavior During Therapy: WFL for tasks assessed/performed Overall Cognitive Status: Within Functional Limits for tasks assessed                                        Exercises      General Comments        Pertinent Vitals/Pain Pain Assessment: 0-10 Pain Score: 8  Pain Location: right upper anterior thigh and groin only with returning to bed Pain Descriptors / Indicators: Squeezing("digging, twisting") Pain Intervention(s): Monitored during session;Repositioned;Premedicated before session;Heat applied    Home Living Family/patient expects to be discharged to:: Private residence Living Arrangements: Alone         Home Layout: One [place]: Environmental consultant - 2 wheels;Walker - 4 wheels;Cane - single point;Tub bench Additional Comments:  pt stayed at son's house upon d/c last admission however reports she must return home    Prior Function Level of Independence: Independent with assistive device(s);Needs assistance      Comments: pt reports gradually requiring more assist from family upon last d/c due to pain   PT Goals (current goals can now be found in the care plan section) Acute Rehab PT Goals Patient Stated Goal: have right hip/thigh pain go away PT Goal Formulation: With patient Time For Goal Achievement: 01/16/19 Potential to Achieve Goals: Good Progress towards PT goals:  Progressing toward goals    Frequency    7X/week      PT Plan Current plan remains appropriate    Co-evaluation              AM-PAC PT "6 Clicks" Mobility   Outcome Measure  Help needed turning from your back to your side while in a flat bed without using bedrails?: A Little Help needed moving from lying on your back to sitting on the side of a flat bed without using bedrails?: A Lot Help needed moving to and from a bed to a chair (including a wheelchair)?: A Lot Help needed standing up from a chair using your arms (e.g., wheelchair or bedside chair)?: A Little Help needed to walk in hospital room?: A Little Help needed climbing 3-5 steps with a railing? : A Lot 6 Click Score: 15    End of Session Equipment Utilized During Treatment: Gait belt;Right knee immobilizer Activity Tolerance: Patient limited by pain Patient left: with call bell/phone within reach;in bed Nurse Communication: Mobility status PT Visit Diagnosis: Difficulty in walking, not elsewhere classified (R26.2)     Time: 2355-7322 PT Time Calculation (min) (ACUTE ONLY): 21 min  Charges:  $Gait Training: 8-22 mins                     Zenovia Jarred, PT, DPT Acute Rehabilitation Services Office: [phone] Pager: [phone]   [name] 01/02/2019, 3:50 PM

## 2019-01-02 NOTE — Progress Notes (Signed)
     Subjective: 1 Day Post-Op Procedure(s) (LRB): TOTAL HIP REVISION, FEMORAL REVISION, POSTERIOR APPROACH (Right)   Patient reports pain as moderate, pain controlled with medication.  No reported events throughout the night.  Dr. Alvan Dame discussed the procedure, findings and expectations moving forward.  Discharge disposition to be determined with regards to progress with physical therapy.  Plan on the patient be admitted for at least a couple days to see how she progresses to a point of safe discharge.  Plan for discharge tomorrow due to underlying medical co-morbidities, pain control and need for inpatient therapy to meet goal of being discharged home safely with family/caregiver.  Past Medical History:  Diagnosis Date  . Arthritis   . Asthma   . Barrett's esophagus    PERSONAL HISTORY PER PATIENT   . Complication of anesthesia   . Diabetes mellitus without complication ([place])    TYPE 2 , MONITORS CBGS AT HOME ; 12-31-2018  i dstopped checking it now , denies 3ps   . Diverticulitis   . Dyspnea    pulmonogist [place]   . Dysrhythmia   . Enlarged tonsils   . Fibromyalgia 2006  . GERD (gastroesophageal reflux disease)   . History of blood transfusion   . Hypertension   . Migraine   . Narrowing of airway    per patient report ; 12-31-2018 " i had a spina"   . Panic attack    per patient report  . Pneumonia 07/2018  . Renal insufficiency    per patient report, acute kidney injury when she had pneumonia march 2020 , reports back normal   . Sleep apnea    OSA WITH BIPAP         Objective:   VITALS:   Vitals:   01/02/19 0046 01/02/19 0613  BP: 125/74 125/66  Pulse: 68 68  Resp: 18 18  Temp: 97.6 F (36.4 C) 98.1 F (36.7 C)  SpO2: 99% 100%    Dorsiflexion/Plantar flexion intact Incision: dressing C/D/I No cellulitis present Compartment soft  LABS Recent Labs    01/01/19 1200 01/02/19 0224  HGB 11.7* 8.8*  HCT 41.1 30.0*  WBC 9.2 9.0  PLT 272 211    Recent  Labs    01/01/19 1200 01/02/19 0224  NA 140 137  K 5.1 3.9  BUN 14 14  CREATININE 0.89 0.68  GLUCOSE 129* 240*     Assessment/Plan: 1 Day Post-Op Procedure(s) (LRB): TOTAL HIP REVISION, FEMORAL REVISION, POSTERIOR APPROACH (Right) Foley cath d/c'ed Advance diet KI immobilizer removed and may be WBAT Up with therapy D/C IV fluids Discharge disposition to be determined  Obese (BMI 30-39.9) Estimated body mass index is 35.38 kg/m as calculated from the following:   Height as of this encounter: 5' 1.5" (1.562 m).   Weight as of this encounter: 86.3 kg. Patient also counseled that weight may inhibit the healing process Patient counseled that losing weight will help with future health issues      [place] Pugh. Deniah Saia   PAC  01/02/2019, 8:31 AM

## 2019-01-02 NOTE — Plan of Care (Signed)
Plan of care reviewed and discussed with the patient. 

## 2019-01-02 NOTE — Plan of Care (Signed)
Continue current POC 

## 2019-01-03 LAB — CBC
HCT: 27.4 % — ABNORMAL LOW (ref 36.0–46.0)
Hemoglobin: 8 g/dL — ABNORMAL LOW (ref 12.0–15.0)
MCH: 21.9 pg — ABNORMAL LOW (ref 26.0–34.0)
MCHC: 29.2 g/dL — ABNORMAL LOW (ref 30.0–36.0)
MCV: 74.9 fL — ABNORMAL LOW (ref 80.0–100.0)
Platelets: 198 10*3/uL (ref 150–400)
RBC: 3.66 MIL/uL — ABNORMAL LOW (ref 3.87–5.11)
RDW: 18.2 % — ABNORMAL HIGH (ref 11.5–15.5)
WBC: 9.2 10*3/uL (ref 4.0–10.5)
nRBC: 0.2 % (ref 0.0–0.2)

## 2019-01-03 LAB — BASIC METABOLIC PANEL
Anion gap: 10 (ref 5–15)
BUN: 16 mg/dL (ref 8–23)
CO2: 24 mmol/L (ref 22–32)
Calcium: 8.4 mg/dL — ABNORMAL LOW (ref 8.9–10.3)
Chloride: 104 mmol/L (ref 98–111)
Creatinine, Ser: 0.69 mg/dL (ref 0.44–1.00)
GFR calc Af Amer: >60 mL/min (ref >60)
GFR calc non Af Amer: >60 mL/min (ref >60)
Glucose, Bld: 138 mg/dL — ABNORMAL HIGH (ref 70–99)
Potassium: 3.9 mmol/L (ref 3.5–5.1)
Sodium: 138 mmol/L (ref 135–145)

## 2019-01-03 LAB — GLUCOSE, CAPILLARY
Glucose-Capillary: 112 mg/dL — ABNORMAL HIGH (ref 70–99)
Glucose-Capillary: 150 mg/dL — ABNORMAL HIGH (ref 70–99)
Glucose-Capillary: 188 mg/dL — ABNORMAL HIGH (ref 70–99)
Glucose-Capillary: 110 mg/dL — ABNORMAL HIGH (ref 70–99)

## 2019-01-03 NOTE — Progress Notes (Signed)
Pt. has own hose/mask at bedside, wants to just wear n/c for this evening, aware to notify if wanting CPAP set up.

## 2019-01-03 NOTE — Progress Notes (Signed)
Physical Therapy Treatment Patient Details Name: Anne Bryant MRN: [id number] DOB: [birth year] Today's Date: 01/03/2019    History of Present Illness Pt is a 73 year old female s/p Right DA THA 3 weeks ago and currently s/p right posterior approach femoral hip revision 01/01/19 due to Right periprosthetic femur fracture with failed femoral component with subsidence.    PT Comments    Pt assisted into bathroom and then ambulated in hallway.  Pt reports adjustment in her meds is allowing her to tolerate mobility better today.  Pt still requiring min assist for safe mobility.  Pt states her son can stay with her at night however she would be alone during the day.  If home, pt will need increased home care.   Follow Up Recommendations  Follow surgeon's recommendation for DC plan and follow-up therapies(if not SNF and home, pt will need increased home care)     Equipment Recommendations       Recommendations for Other Services       Precautions / Restrictions Precautions Precautions: Fall;Posterior Hip Precaution Comments: reviewed posterior hip precautions Restrictions Other Position/Activity Restrictions: WBAT    Mobility  Bed Mobility Overal bed mobility: Needs Assistance Bed Mobility: Supine to Sit     Supine to sit: Min guard     General bed mobility comments: verbal cues for technique, increased time and effort due to pain  Transfers Overall transfer level: Needs assistance Equipment used: Rolling walker (2 wheeled) Transfers: Sit to/from Stand Sit to Stand: Min assist         General transfer comment: verbal cues for UE and LE positioning; assist to rise and steady, cues for precautions  Ambulation/Gait Ambulation/Gait assistance: Min assist Gait Distance (Feet): 100 Feet Assistive device: Rolling walker (2 wheeled) Gait Pattern/deviations: Step-to pattern;Decreased stance time - right;Decreased weight shift to right     General Gait Details: cues  for sequence and RW position, pt with difficulty advancing R LE, cues for maintaining posterior hip precautions; assist for occasional LOB due to pain   Stairs             Wheelchair Mobility    Modified Rankin (Stroke Patients Only)       Balance                                            Cognition Arousal/Alertness: Awake/alert Behavior During Therapy: WFL for tasks assessed/performed Overall Cognitive Status: Within Functional Limits for tasks assessed                                        Exercises      General Comments        Pertinent Vitals/Pain Pain Assessment: 0-10 Pain Score: 7  Pain Location: right upper anterior thigh and groin Pain Descriptors / Indicators: Squeezing("twisting/digging") Pain Intervention(s): Monitored during session;Repositioned;Premedicated before session    Home Living                      Prior Function            PT Goals (current goals can now be found in the care plan section) Progress towards PT goals: Progressing toward goals    Frequency    7X/week      PT Plan Current  plan remains appropriate    Co-evaluation              AM-PAC PT "6 Clicks" Mobility   Outcome Measure  Help needed turning from your back to your side while in a flat bed without using bedrails?: A Little Help needed moving from lying on your back to sitting on the side of a flat bed without using bedrails?: A Little Help needed moving to and from a bed to a chair (including a wheelchair)?: A Little Help needed standing up from a chair using your arms (e.g., wheelchair or bedside chair)?: A Little Help needed to walk in hospital room?: A Little Help needed climbing 3-5 steps with a railing? : A Lot 6 Click Score: 17    End of Session Equipment Utilized During Treatment: Gait belt Activity Tolerance: Patient tolerated treatment well Patient left: in chair;with call bell/phone within  reach Nurse Communication: Mobility status PT Visit Diagnosis: Difficulty in walking, not elsewhere classified (R26.2)     Time: 0174-9449 PT Time Calculation (min) (ACUTE ONLY): 40 min  Charges:  $Gait Training: 23-37 mins           Anne Bryant, PT, DPT Acute Rehabilitation Services Office: [phone] Pager: [phone]  Anne Bryant 01/03/2019, 2:20 PM

## 2019-01-03 NOTE — Progress Notes (Signed)
Physical Therapy Treatment Patient Details Name: Anne Bryant MRN: [id number] DOB: December 27, 1945 Today's Date: 01/03/2019    History of Present Illness Pt is a 73 year old female s/p Right DA THA 3 weeks ago and currently s/p right posterior approach femoral hip revision 01/01/19 due to Right periprosthetic femur fracture with failed femoral component with subsidence.    PT Comments    Pt had pain medication prior to session however unable to tolerate even standing due to spasms and "twisting/digging" pain in right thigh.  Pt repositioned in recliner and provided warm pack to thigh and ice pack to new incision.  RN aware of pain and to bring muscle relaxer.    Follow Up Recommendations  Follow surgeon's recommendation for DC plan and follow-up therapies(if not SNF and home, pt will need increased home care)     Equipment Recommendations  None recommended by PT    Recommendations for Other Services       Precautions / Restrictions Precautions Precautions: Fall;Posterior Hip Precaution Comments: reviewed posterior hip precautions Restrictions Other Position/Activity Restrictions: WBAT    Mobility  Bed Mobility   Transfers Overall transfer level: Needs assistance Equipment used: Rolling walker (2 wheeled) Transfers: Sit to/from Stand Sit to Stand: Mod assist         General transfer comment: verbal cues for UE and LE positioning; assist to rise and steady, cues for precautions; increased assist this afternoon due to pain; pt assisted with donning mesh panties/pad (had urinary incontinence episode with nurse tech on arrival and didn't make it into bathroom)  Ambulation/Gait   Stairs             Wheelchair Mobility    Modified Rankin (Stroke Patients Only)       Balance                                            Cognition Arousal/Alertness: Awake/alert Behavior During Therapy: WFL for tasks assessed/performed Overall Cognitive  Status: Within Functional Limits for tasks assessed                                        Exercises      General Comments        Pertinent Vitals/Pain Pain Assessment: 0-10 Pain Score: 10-Worst pain ever Pain Location: right upper anterior thigh and groin Pain Descriptors / Indicators: Squeezing(twisting/digging) Pain Intervention(s): Monitored during session;Repositioned;Premedicated before session;Ice applied;Heat applied(pt had pain meds, requested muscle relaxer from RN)    Home Living                      Prior Function            PT Goals (current goals can now be found in the care plan section) Progress towards PT goals: Not progressing toward goals - comment(pain limiting)    Frequency    7X/week      PT Plan Current plan remains appropriate    Co-evaluation              AM-PAC PT "6 Clicks" Mobility   Outcome Measure  Help needed turning from your back to your side while in a flat bed without using bedrails?: A Little Help needed moving from lying on your back to sitting on the side  of a flat bed without using bedrails?: A Little Help needed moving to and from a bed to a chair (including a wheelchair)?: A Lot Help needed standing up from a chair using your arms (e.g., wheelchair or bedside chair)?: A Lot Help needed to walk in hospital room?: A Lot Help needed climbing 3-5 steps with a railing? : A Lot 6 Click Score: 14    End of Session Equipment Utilized During Treatment: Gait belt Activity Tolerance: Patient limited by pain Patient left: in chair;with call bell/phone within reach;with nursing/sitter in room Nurse Communication: Mobility status PT Visit Diagnosis: Difficulty in walking, not elsewhere classified (R26.2)     Time: 4315-4008 PT Time Calculation (min) (ACUTE ONLY): 15 min  Charges:   $Therapeutic Activity: 8-22 mins                    Zenovia Jarred, PT, DPT Acute Rehabilitation Services Office:  [phone] Pager: [phone]   [name] 01/03/2019, 4:28 PM

## 2019-01-03 NOTE — Progress Notes (Signed)
Patient ID: Anne Bryant, female   DOB: May 18, 1945, 73 y.o.   MRN: [id number] Subjective: 2 Days Post-Op Procedure(s) (LRB): TOTAL HIP REVISION, FEMORAL REVISION, POSTERIOR APPROACH (Right)    Patient reports pain as moderate.  Mid thigh muscle pain seems to be largest concern.  Desires to work hard with PT to try and avoid having to go to SNF  Objective:   VITALS:   Vitals:   01/02/19 2155 01/03/19 0545  BP: (!) 120/57 93/63  Pulse: 73 61  Resp: 18 18  Temp: 98.5 F (36.9 C) 97.9 F (36.6 C)  SpO2: 96% 100%    Neurovascular intact Incision: dressing C/D/I  LABS Recent Labs    01/01/19 1200 01/02/19 0224 01/03/19 0222  HGB 11.7* 8.8* 8.0*  HCT 41.1 30.0* 27.4*  WBC 9.2 9.0 9.2  PLT 272 211 198    Recent Labs    01/01/19 1200 01/02/19 0224 01/03/19 0222  NA 140 137 138  K 5.1 3.9 3.9  BUN 14 14 16   CREATININE 0.89 0.68 0.69  GLUCOSE 129* 240* 138*    No results for input(s): LABPT, INR in the last 72 hours.   Assessment/Plan: 2 Days Post-Op Procedure(s) (LRB): TOTAL HIP REVISION, FEMORAL REVISION, POSTERIOR APPROACH (Right)   Up with therapy   D/C knee immobilizer now that anesthesia has worn off WBAT RLE Posterior hip precautions right hip  Disposition pending therapy assessment, recs, and discussion with patient and family Decision for discharge tomorrow with above discussion

## 2019-01-04 LAB — GLUCOSE, CAPILLARY
Glucose-Capillary: 92 mg/dL (ref 70–99)
Glucose-Capillary: 156 mg/dL — ABNORMAL HIGH (ref 70–99)
Glucose-Capillary: 186 mg/dL — ABNORMAL HIGH (ref 70–99)
Glucose-Capillary: 120 mg/dL — ABNORMAL HIGH (ref 70–99)

## 2019-01-04 MED ORDER — ASPIRIN 81 MG PO CHEW: 81.0000 mg | CHEWABLE_TABLET | Freq: Two times a day (BID) | ORAL | 0 refills | Status: AC

## 2019-01-04 MED ORDER — FERROUS SULFATE 325 (65 FE) MG PO TABS: 325.0000 mg | ORAL_TABLET | Freq: Three times a day (TID) | ORAL | 0 refills | Status: AC

## 2019-01-04 MED ORDER — POLYETHYLENE GLYCOL 3350 17 G PO PACK: 17.0000 g | PACK | Freq: Two times a day (BID) | ORAL | 0 refills | Status: AC

## 2019-01-04 MED ORDER — OXYCODONE HCL 5 MG PO TABS: 5.0000 mg | ORAL_TABLET | ORAL | 0 refills | Status: AC | PRN

## 2019-01-04 MED ORDER — DOCUSATE SODIUM 100 MG PO CAPS: 100.0000 mg | ORAL_CAPSULE | Freq: Two times a day (BID) | ORAL | 0 refills | Status: AC

## 2019-01-04 MED ORDER — CYCLOBENZAPRINE HCL 10 MG PO TABS: 10.0000 mg | ORAL_TABLET | Freq: Three times a day (TID) | ORAL | 0 refills | Status: AC | PRN

## 2019-01-04 NOTE — TOC Initial Note (Addendum)
Transition of Care [facility]) - Initial/Assessment Note    Patient Details  Name: Anne Bryant MRN: [id number] Date of Birth: 1945/12/05  Transition of [facility]) CM/SW Contact:    Leeroy Cha, RN Phone Number: 01/04/2019, 12:14 PM  Clinical Narrative:                 Patient wants to go to [facility] SN[facility] in [place]/info fl2 and notes faxed to attention Misty at [phone], Correct ssn for patient is [phone] per Dale must.passar number [phone] A obtained.  updated l2 faxed via system to [facility]     Patient Goals and CMS Choice        Expected Discharge Plan and Services           Expected Discharge Date: 01/04/19                                    Prior Living Arrangements/Services                       Activities of Daily Living Home Assistive Devices/Equipment: Gilford Rile (specify type), BIPAP, Dentures (specify type), Eyeglasses ADL Screening (condition at time of admission) Patient's cognitive ability adequate to safely complete daily activities?: Yes Is the patient deaf or have difficulty hearing?: No Does the patient have difficulty seeing, even when wearing glasses/contacts?: No Does the patient have difficulty concentrating, remembering, or making decisions?: No Patient able to express need for assistance with ADLs?: Yes Does the patient have difficulty dressing or bathing?: No Independently performs ADLs?: Yes (appropriate for developmental age) Does the patient have difficulty walking or climbing stairs?: Yes Weakness of Legs: Right Weakness of Arms/Hands: None  Permission Sought/Granted                  Emotional Assessment              Admission diagnosis:  Right periprostetic femur fracture Patient Active Problem List   Diagnosis Date Noted  . Obese 01/02/2019  . S/P right TH rev 01/01/2019  . Status post right hip replacement 12/11/2018  . Chest pain 06/19/2016  . Major depressive disorder,  recurrent episode, severe, without mention of psychotic behavior 02/15/2013  . GAD (generalized anxiety disorder) 02/15/2013  . DYSPNEA 06/23/2009  . CHEST PAIN-PRECORDIAL 06/23/2009  . Diabetes mellitus due to underlying condition ([address]) 06/19/2009  . HYPERLIPIDEMIA 06/19/2009  . LEUKOCYTOSIS 06/19/2009  . Anxiety state 06/19/2009  . PANIC ATTACK 06/19/2009  . DEPRESSION 06/19/2009  . MIGRAINE HEADACHE 06/19/2009  . Essential hypertension 06/19/2009  . Asthma 06/19/2009  . RHEUMATOID ARTHRITIS 06/19/2009  . OSTEOARTHRITIS 06/19/2009  . Fibromyalgia 06/19/2009  . LEG EDEMA 06/19/2009  . EPISTAXIS 06/19/2009  . DYSPNEA ON EXERTION 06/19/2009  . IMPAIRED FASTING GLUCOSE 06/19/2009   PCP:  Martin[place], Julie M, NP Pharmacy:   [facility], [place] - [address] [place] [address] Phone: [phone] Fax: [phone]     Social Determinants of Health (SDOH) Interventions    Readmission Risk Interventions No flowsheet data found.

## 2019-01-04 NOTE — Care Management Important Message (Signed)
Important Message  Patient Details IM Letter given to Velva Harman RN to present to the Patient Name: Anne Bryant MRN: [id number] Date of Birth: 04/04/1946   Medicare Important Message Given:  Yes     Kerin Salen 01/04/2019, 12:17 PM

## 2019-01-04 NOTE — NC FL2 (Addendum)
[place] LEVEL OF CARE SCREENING TOOL     IDENTIFICATION  Patient Name: Anne Bryant Birthdate: 1946-04-03 Sex: female Admission Date (Current Location): 01/01/2019  [facility] and [place] Number:  Herbalist and Address:         Provider Number: [id number]  Attending Physician Name and Address:  Paralee Cancel, MD  Relative Name and Phone Number:       Current Level of Care: SNF Recommended Level of Care: [place] Prior Approval Number:    Date Approved/Denied:   PASRR Number:  [id number]  Discharge Plan: SNF    Current Diagnoses: Patient Active Problem List   Diagnosis Date Noted  . Obese 01/02/2019  . S/P right TH rev 01/01/2019  . Status post right hip replacement 12/11/2018  . Chest pain 06/19/2016  . Major depressive disorder, recurrent episode, severe, without mention of psychotic behavior 02/15/2013  . GAD (generalized anxiety disorder) 02/15/2013  . DYSPNEA 06/23/2009  . CHEST PAIN-PRECORDIAL 06/23/2009  . Diabetes mellitus due to underlying condition ([place]) 06/19/2009  . HYPERLIPIDEMIA 06/19/2009  . LEUKOCYTOSIS 06/19/2009  . Anxiety state 06/19/2009  . PANIC ATTACK 06/19/2009  . DEPRESSION 06/19/2009  . MIGRAINE HEADACHE 06/19/2009  . Essential hypertension 06/19/2009  . Asthma 06/19/2009  . RHEUMATOID ARTHRITIS 06/19/2009  . OSTEOARTHRITIS 06/19/2009  . Fibromyalgia 06/19/2009  . LEG EDEMA 06/19/2009  . EPISTAXIS 06/19/2009  . DYSPNEA ON EXERTION 06/19/2009  . IMPAIRED FASTING GLUCOSE 06/19/2009    Orientation RESPIRATION BLADDER Height & Weight     Self, Time, Situation, Place  Normal Continent Weight: 86.3 kg Height:  5' 1.5" (156.2 cm)  BEHAVIORAL SYMPTOMS/MOOD NEUROLOGICAL BOWEL NUTRITION STATUS      Continent Diet(regular)  AMBULATORY STATUS COMMUNICATION OF NEEDS Skin   Extensive Assist(pt x 5 weekly) Verbally Normal                       Personal Care Assistance Level of  Assistance  Bathing, Feeding, Dressing Bathing Assistance: Limited assistance Feeding assistance: Limited assistance Dressing Assistance: Limited assistance     Functional Limitations Info             SPECIAL CARE FACTORS FREQUENCY                       Contractures Contractures Info: Not present    Additional Factors Info                  Current Medications (01/04/2019):  This is the current hospital active medication list Current Facility-Administered Medications  Medication Dose Route Frequency Provider Last Rate Last Dose  . 0.9 %  sodium chloride infusion   Intravenous Continuous Danae Orleans, PA-C   Stopped at 01/03/19 9563  . albuterol (PROVENTIL) (2.5 MG/3ML) 0.083% nebulizer solution 2.5 mg  2.5 mg Inhalation Q6H PRN Danae Orleans, PA-C      . alum & mag hydroxide-simeth (MAALOX/MYLANTA) 200-200-20 MG/5ML suspension 15 mL  15 mL Oral Q4H PRN Danae Orleans, PA-C      . aspirin chewable tablet 81 mg  81 mg Oral BID Danae Orleans, PA-C   81 mg at 01/04/19 0902  . atorvastatin (LIPITOR) tablet 10 mg  10 mg Oral QHS Danae Orleans, PA-C   10 mg at 01/03/19 2209  . bisacodyl (DULCOLAX) suppository 10 mg  10 mg Rectal Daily PRN Danae Orleans, PA-C      . buPROPion (WELLBUTRIN XL) 24 hr tablet 150 mg  150  mg Oral Daily Lanney GinsBabish, Matthew, PA-C   150 mg at 01/04/19 [id number]  . celecoxib (CELEBREX) capsule 200 mg  200 mg Oral BID Lanney GinsBabish, Matthew, PA-C   200 mg at 01/04/19 [id number]  . cyclobenzaprine (FLEXERIL) tablet 5-10 mg  5-10 mg Oral TID PRN Lanney GinsBabish, Matthew, PA-C   10 mg at 01/04/19 0443  . dexamethasone (DECADRON) injection 10 mg  10 mg Intravenous Once Babish, Matthew, PA-C      . diphenhydrAMINE (BENADRYL) 12.5 MG/5ML elixir 12.5-25 mg  12.5-25 mg Oral Q4H PRN Babish, Matthew, PA-C      . docusate sodium (COLACE) capsule 100 mg  100 mg Oral BID Babish, Matthew, PA-C   100 mg at 01/01/19 2135  . ferrous sulfate tablet 325 mg  325 mg Oral TID PC Lanney GinsBabish, Matthew,  PA-C   325 mg at 01/04/19 0901  . furosemide (LASIX) tablet 20 mg  20 mg Oral Daily PRN Lanney GinsBabish, Matthew, PA-C      . glipiZIDE (GLUCOTROL XL) 24 hr tablet 5 mg  5 mg Oral QAC breakfast Lanney GinsBabish, Matthew, PA-C   5 mg at 01/04/19 [id number]  . HYDROmorphone (DILAUDID) injection 0.5-1 mg  0.5-1 mg Intravenous Q2H PRN Lanney GinsBabish, Matthew, PA-C   1 mg at 01/03/19 2206  . insulin aspart (novoLOG) injection 0-15 Units  0-15 Units Subcutaneous TID WC Lanney GinsBabish, Matthew, PA-C   3 Units at 01/03/19 1703  . loratadine (CLARITIN) tablet 10 mg  10 mg Oral Daily Lanney GinsBabish, Matthew, PA-C   10 mg at 01/04/19 0901  . magnesium citrate solution 1 Bottle  1 Bottle Oral Once PRN Babish, Matthew, PA-C      . menthol-cetylpyridinium (CEPACOL) lozenge 3 mg  1 lozenge Oral PRN Lanney GinsBabish, Matthew, PA-C       Or  . phenol (CHLORASEPTIC) mouth spray 1 spray  1 spray Mouth/Throat PRN Babish, Matthew, PA-C      . metoCLOPramide (REGLAN) tablet 5-10 mg  5-10 mg Oral Q8H PRN Lanney GinsBabish, Matthew, PA-C       Or  . metoCLOPramide (REGLAN) injection 5-10 mg  5-10 mg Intravenous Q8H PRN Lanney GinsBabish, Matthew, PA-C      . metoprolol succinate (TOPROL-XL) 24 hr tablet 50 mg  50 mg Oral QHS Babish, Matthew, PA-C   50 mg at 01/03/19 2210  . mometasone-formoterol (DULERA) 200-5 MCG/ACT inhaler 2 puff  2 puff Inhalation BID Lanney GinsBabish, Matthew, PA-C   2 puff at 01/04/19 0827  . ondansetron (ZOFRAN) tablet 4 mg  4 mg Oral Q6H PRN Lanney GinsBabish, Matthew, PA-C       Or  . ondansetron [facility](ZOFRAN) injection 4 mg  4 mg Intravenous Q6H PRN Lanney GinsBabish, Matthew, PA-C      . oxyCODONE (Oxy IR/ROXICODONE) immediate release tablet 10 mg  10 mg Oral Q4H PRN Lanney GinsBabish, Matthew, PA-C      . oxyCODONE (Oxy IR/ROXICODONE) immediate release tablet 15 mg  15 mg Oral Q4H PRN Lanney GinsBabish, Matthew, PA-C   15 mg at 01/04/19 0902  . pantoprazole (PROTONIX) EC tablet 40 mg  40 mg Oral BID Lanney GinsBabish, Matthew, PA-C   40 mg at 01/04/19 0901  . polyethylene glycol (MIRALAX / GLYCOLAX) packet 17 g  17 g Oral BID Babish,  Matthew, PA-C      . predniSONE (DELTASONE) tablet 7.5 mg  7.5 mg Oral Q breakfast Babish, Matthew, PA-C   7.5 mg at 01/04/19 [id number]  . primidone (MYSOLINE) tablet 100 mg  100 mg Oral QHS Lanney GinsBabish, Matthew, PA-C   100 mg at 01/03/19 2221  .  SUMAtriptan (IMITREX) tablet 100 mg  100 mg Oral Q2H PRN Lanney Gins, PA-C         Discharge Medications: Please see discharge summary for a list of discharge medications.  Relevant Imaging Results:  Relevant Lab Results:   Additional Information ssn:[id number]  Golda Acre, RN

## 2019-01-04 NOTE — Progress Notes (Signed)
Physical Therapy Treatment Patient Details Name: Anne Bryant MRN: [id number] DOB: [birth year] Today's Date: 01/04/2019    History of Present Illness Pt is a 73 year old female s/p Right DA THA 3 weeks ago and currently s/p right posterior approach femoral hip revision 01/01/19 due to Right periprosthetic femur fracture with failed femoral component with subsidence.    PT Comments    Pt performed LE exercises in bed and then assisted OOB to recliner.  Pt attempted ambulation however too painful and pt reporting feeling a difference in LE length today.  Pt plans to remain in hospital and work with therapy in hopes of improving mobility to d/c home eventually.    Follow Up Recommendations  Follow surgeon's recommendation for DC plan and follow-up therapies     Equipment Recommendations  None recommended by PT    Recommendations for Other Services       Precautions / Restrictions Precautions Precautions: Fall;Posterior Hip Precaution Comments: reviewed posterior hip precautions Restrictions Other Position/Activity Restrictions: WBAT    Mobility  Bed Mobility Overal bed mobility: Needs Assistance Bed Mobility: Supine to Sit     Supine to sit: Min assist     General bed mobility comments: assist with R LE for pain control  Transfers Overall transfer level: Needs assistance Equipment used: Rolling walker (2 wheeled) Transfers: Sit to/from Stand Sit to Stand: Min assist         General transfer comment: verbal cues for UE and LE positioning; assist to rise and steady, cues for precautions  Ambulation/Gait Ambulation/Gait assistance: Min assist Gait Distance (Feet): 3 Feet Assistive device: Rolling walker (2 wheeled) Gait Pattern/deviations: Step-to pattern;Decreased stance time - right;Decreased weight shift to right     General Gait Details: cues for sequence and RW position, pt with difficulty advancing R LE, pt reports LLD and feels frustrated, only able  to take a few steps forward and then backwards to recliner   Stairs             Wheelchair Mobility    Modified Rankin (Stroke Patients Only)       Balance                                            Cognition Arousal/Alertness: Awake/alert Behavior During Therapy: WFL for tasks assessed/performed Overall Cognitive Status: Within Functional Limits for tasks assessed                                        Exercises Total Joint Exercises Ankle Circles/Pumps: AROM;Both;10 reps Quad Sets: AROM;Both;10 reps Short Arc Quad: Right;10 reps;AAROM Heel Slides: AAROM;Right;10 reps Hip ABduction/ADduction: 10 reps;Right;AAROM    General Comments        Pertinent Vitals/Pain Pain Assessment: 0-10 Pain Score: 10-Worst pain ever Pain Location: pain now in incision area and posterior hip Pain Descriptors / Indicators: Sore;Grimacing;Aching Pain Intervention(s): Monitored during session;Repositioned;Limited activity within patient's tolerance;Premedicated before session;Heat applied(heat applied to thigh (not over incision))    Home Living                      Prior Function            PT Goals (current goals can now be found in the care plan section) Progress towards PT goals:  Progressing toward goals    Frequency    7X/week      PT Plan Current plan remains appropriate    Co-evaluation              AM-PAC PT "6 Clicks" Mobility   Outcome Measure  Help needed turning from your back to your side while in a flat bed without using bedrails?: A Little Help needed moving from lying on your back to sitting on the side of a flat bed without using bedrails?: A Little Help needed moving to and from a bed to a chair (including a wheelchair)?: A Little Help needed standing up from a chair using your arms (e.g., wheelchair or bedside chair)?: A Little Help needed to walk in hospital room?: A Lot Help needed climbing 3-5  steps with a railing? : A Lot 6 Click Score: 16    End of Session Equipment Utilized During Treatment: Gait belt Activity Tolerance: Patient limited by pain Patient left: in chair;with call bell/phone within reach   PT Visit Diagnosis: Difficulty in walking, not elsewhere classified (R26.2)     Time: 1001-1030 PT Time Calculation (min) (ACUTE ONLY): 29 min  Charges:  $Therapeutic Exercise: 8-22 mins $Therapeutic Activity: 8-22 mins                    Zenovia Jarred, PT, DPT Acute Rehabilitation Services Office: [phone] Pager: [phone]  [name] 01/04/2019, 12:45 PM

## 2019-01-04 NOTE — Progress Notes (Signed)
     Subjective: 3 Days Post-Op Procedure(s) (LRB): TOTAL HIP REVISION, FEMORAL REVISION, POSTERIOR APPROACH (Right)   Patient reports pain as significant with motion of the right hip.  Patient describes most of her pain to be in the posterior/lateral aspect of the hip, in the area of the surgery.  She feels good while standing yesterday, but when sitting had extreme pain again in the hip area.  With the pain is at is worse the patient states that she would like to cut off the leg.  Dr. Alvan Dame discussed the use of continue medication to help with the pain.  Also discussion with the patient about safety and upon discharge, this point she will continue working with physical therapy to see how she progresses.  If no significant progress has been made we will plan on going to SNF on Monday.  She does make significant strides in PT plan would be to be discharged home with home health.  Discussion was had with regards to ABLA and how we will monitor her blood with labs tomorrow to see if her blood is stable.  States that the Flexeril is working better than the Robaxin and control muscle spasms.     Objective:   VITALS:   Vitals:   01/03/19 2041 01/04/19 0400  BP: 113/68 102/72  Pulse: 76 67  Resp: 19 18  Temp: 98.2 F (36.8 C) 98.1 F (36.7 C)  SpO2: 96% 97%    Dorsiflexion/Plantar flexion intact Incision: dressing C/D/I No cellulitis present Compartment soft  LABS Recent Labs    01/01/19 1200 01/02/19 0224 01/03/19 0222  HGB 11.7* 8.8* 8.0*  HCT 41.1 30.0* 27.4*  WBC 9.2 9.0 9.2  PLT 272 211 198    Recent Labs    01/01/19 1200 01/02/19 0224 01/03/19 0222  NA 140 137 138  K 5.1 3.9 3.9  BUN 14 14 16   CREATININE 0.89 0.68 0.69  GLUCOSE 129* 240* 138*     Assessment/Plan: 3 Days Post-Op Procedure(s) (LRB): TOTAL HIP REVISION, FEMORAL REVISION, POSTERIOR APPROACH (Right) Up with therapy, WBAT Posterior hip precautions Discharge disposition TBD Plan is for possible  SNF on Monday, depending on progress with PT. Could be d/c'ed home with HHPT if she progresses significantly with PT At this point she is not safe to be discharged  ABLA  Treatment with iron and will observe Will order CBC tomorrow to evaluate stability  Obese (BMI 30-39.9) Estimated body mass index is 35.38 kg/m as calculated from the following:   Height as of this encounter: 5' 1.5" (1.562 m).   Weight as of this encounter: 86.3 kg. Patient also counseled that weight may inhibit the healing process Patient counseled that losing weight will help with future health issues      Anne Bryant   PAC  01/04/2019, 8:13 AM

## 2019-01-04 NOTE — Progress Notes (Signed)
Physical Therapy Treatment Patient Details Name: Anne Bryant MRN: [id number] DOB: 01/18/1946 Today's Date: 01/04/2019    History of Present Illness Pt is a 73 year old female s/p Right DA THA 3 weeks ago and currently s/p right posterior approach femoral hip revision 01/01/19 due to Right periprosthetic femur fracture with failed femoral component with subsidence.    PT Comments    Pt provided with gait belt to self assist R LEpositioning.  Pt utilized this for repositioning in preparation to stand.  Pt reports pain improved and tolerable for mobility however continues to reports feeling as though her left leg is longer. Pt fatigued quickly and tends to perform increased flexion pattern with ambulating despite cues for posture.   Pt pleasant and cooperative however does appear frustrated with her lack of progress.   Follow Up Recommendations  Follow surgeon's recommendation for DC plan and follow-up therapies     Equipment Recommendations  None recommended by PT    Recommendations for Other Services       Precautions / Restrictions Precautions Precautions: Fall;Posterior Hip Precaution Comments: reviewed posterior hip precautions Restrictions Other Position/Activity Restrictions: WBAT    Mobility  Bed Mobility Overal bed mobility: Needs Assistance Bed Mobility: Supine to Sit     Supine to sit: Min assist     General bed mobility comments: pt up in recliner  Transfers Overall transfer level: Needs assistance Equipment used: Rolling walker (2 wheeled) Transfers: Sit to/from Stand Sit to Stand: Min assist         General transfer comment: verbal cues for UE and LE positioning; assist to rise and steady, cues for precautions  Ambulation/Gait Ambulation/Gait assistance: Min assist;+2 safety/equipment Gait Distance (Feet): 8 Feet Assistive device: Rolling walker (2 wheeled) Gait Pattern/deviations: Step-to pattern;Decreased stance time - right;Decreased  weight shift to right     General Gait Details: cues for sequence and RW position, pt with difficulty advancing R LE, pt reports LLD again, pt encouraged to ambulate as tolerate however fatigued quickly and became more unsteady so had pt sit in recliner (recliner following for safety)   Stairs             Wheelchair Mobility    Modified Rankin (Stroke Patients Only)       Balance                                            Cognition Arousal/Alertness: Awake/alert Behavior During Therapy: WFL for tasks assessed/performed Overall Cognitive Status: Within Functional Limits for tasks assessed                                        Exercises Total Joint Exercises Ankle Circles/Pumps: AROM;Both;10 reps Quad Sets: AROM;Both;10 reps Short Arc Quad: Right;10 reps;AAROM Heel Slides: AAROM;Right;10 reps Hip ABduction/ADduction: 10 reps;Right;AAROM    General Comments        Pertinent Vitals/Pain Pain Assessment: 0-10 Pain Score: 5  Pain Location: pain now in incision area and posterior hip Pain Descriptors / Indicators: Sore;Grimacing;Aching Pain Intervention(s): Heat applied;Premedicated before session;Monitored during session;Repositioned    Home Living                      Prior Function  PT Goals (current goals can now be found in the care plan section) Progress towards PT goals: Progressing toward goals    Frequency    7X/week      PT Plan Current plan remains appropriate    Co-evaluation              AM-PAC PT "6 Clicks" Mobility   Outcome Measure  Help needed turning from your back to your side while in a flat bed without using bedrails?: A Little Help needed moving from lying on your back to sitting on the side of a flat bed without using bedrails?: A Little Help needed moving to and from a bed to a chair (including a wheelchair)?: A Little Help needed standing up from a chair using  your arms (e.g., wheelchair or bedside chair)?: A Little Help needed to walk in hospital room?: A Lot Help needed climbing 3-5 steps with a railing? : A Lot 6 Click Score: 16    End of Session Equipment Utilized During Treatment: Gait belt Activity Tolerance: Patient tolerated treatment well Patient left: in chair;with call bell/phone within reach Nurse Communication: Mobility status PT Visit Diagnosis: Difficulty in walking, not elsewhere classified (R26.2)     Time: 1411-1435 PT Time Calculation (min) (ACUTE ONLY): 24 min  Charges:  $Gait Training: 8-22 mins  $Therapeutic Activity: 8-22 mins           Zenovia Jarred, PT, DPT Acute Rehabilitation Services Office: [phone] Pager: [phone]  [name] 01/04/2019, 3:28 PM

## 2019-01-05 LAB — GLUCOSE, CAPILLARY
Glucose-Capillary: 119 mg/dL — ABNORMAL HIGH (ref 70–99)
Glucose-Capillary: 179 mg/dL — ABNORMAL HIGH (ref 70–99)
Glucose-Capillary: 175 mg/dL — ABNORMAL HIGH (ref 70–99)
Glucose-Capillary: 119 mg/dL — ABNORMAL HIGH (ref 70–99)

## 2019-01-05 NOTE — Progress Notes (Signed)
01/05/19 1445  PT Visit Information  Last PT Received On 01/05/19  Assistance Needed +2 (chair close to follow for pt anxiety)  History of Present Illness Pt is a 73 year old female s/p Right DA THA 3 weeks ago and currently s/p right posterior approach femoral hip revision 01/01/19 due to Right periprosthetic femur fracture with failed femoral component with subsidence.  Subjective Data  Patient Stated Goal have right hip/thigh pain go away  Precautions  Precautions Fall;Posterior Hip  Restrictions  Weight Bearing Restrictions No  RLE Weight Bearing WBAT  Other Position/Activity Restrictions WBAT  Pain Assessment  Pain Assessment 0-10  Pain Score 3  Faces Pain Scale 4  Pain Location pain now in incision area and posterior hip  Pain Descriptors / Indicators Sore;Grimacing;Aching  Pain Intervention(s) Monitored during session;Limited activity within patient's tolerance  Cognition  Arousal/Alertness Awake/alert  Behavior During Therapy Anxious  Overall Cognitive Status Within Functional Limits for tasks assessed  General Comments Continues to be anxious, but calms down with reassurance that she will be able to walk again.  Bed Mobility  General bed mobility comments up in chair  Transfers  Overall transfer level Needs assistance  Equipment used Rolling walker (2 wheeled)  Transfers Sit to/from Stand  Sit to Stand Min guard  General transfer comment min guard to rise from recliner chair with heavy use of arm rests. Cues for technique and hand placement on decent into chair.  Ambulation/Gait  Ambulation/Gait assistance Min assist  Gait Distance (Feet) 20 Feet (20x1, 15x1)  Assistive device Rolling walker (2 wheeled)  Gait Pattern/deviations Step-to pattern;Decreased stance time - right;Decreased weight shift to right;Trunk flexed  General Gait Details Chair close behind as pt is anxious about falling. Pt taking slow shuffling steps. Cues for increased stride length bil and  increased stance time on the L. Overall quality of gait showing improvement as pt progressed. Pt requiring seated rest break secondary to UE fatigue.  Balance  Overall balance assessment Needs assistance;History of Falls  Standing balance-Leahy Scale Poor  Standing balance comment reliant on UEs  PT - End of Session  Equipment Utilized During Treatment Gait belt  Activity Tolerance Patient tolerated treatment well  Patient left in chair;with call bell/phone within reach  Nurse Communication Mobility status   PT - Assessment/Plan  PT Plan Current plan remains appropriate  PT Visit Diagnosis Difficulty in walking, not elsewhere classified (R26.2)  PT Frequency (ACUTE ONLY) 7X/week  Follow Up Recommendations Follow surgeon's recommendation for DC plan and follow-up therapies  PT equipment None recommended by PT  AM-PAC PT "6 Clicks" Mobility Outcome Measure (Version 2)  Help needed turning from your back to your side while in a flat bed without using bedrails? 3  Help needed moving from lying on your back to sitting on the side of a flat bed without using bedrails? 3  Help needed moving to and from a bed to a chair (including a wheelchair)? 3  Help needed standing up from a chair using your arms (e.g., wheelchair or bedside chair)? 3  Help needed to walk in hospital room? 2  Help needed climbing 3-5 steps with a railing?  2  6 Click Score 16  Consider Recommendation of Discharge To: Home with [facility]  PT Goal Progression  Progress towards PT goals Progressing toward goals  Acute Rehab PT Goals  PT Goal Formulation With patient  Time For Goal Achievement 01/16/19  Potential to Achieve Goals Good  PT Time Calculation  PT Start Time (  ACUTE ONLY) 1411  PT Stop Time (ACUTE ONLY) 1434  PT Time Calculation (min) (ACUTE ONLY) 23 min  PT General Charges  $$ ACUTE PT VISIT 1 Visit  PT Treatments  $Gait Training 23-37 mins   Pt continues to have high anxiety this session, but is able to calm  down with max encouragement and reassurance that she can walk. Pt ambulated in hallway with chair close behind for safety. Will continue to follow to progress safety with mobility and functional independence.   Kallie Locks, PTA Pager [phone] Acute Rehab

## 2019-01-05 NOTE — Progress Notes (Signed)
Orthopedics Progress Note  Subjective: Patient frustrated that she is recovering so slowly. She cant lift her leg. She is still in pain  Objective:  Vitals:   01/04/19 2138 01/05/19 0609  BP: 131/70 112/62  Pulse: 88 68  Resp: 20 16  Temp: 98.5 F (36.9 C) 98.2 F (36.8 C)  SpO2: 94% 95%    General: Awake and alert  Musculoskeletal: Right hip dressing intact with no erythema and no drainage. She is able to flex her hip up and down with no pain. Leg lengths are equal Neurovascularly intact  Lab Results  Component Value Date   WBC 9.2 01/03/2019   HGB 8.0 (L) 01/03/2019   HCT 27.4 (L) 01/03/2019   MCV 74.9 (L) 01/03/2019   PLT 198 01/03/2019       Component Value Date/Time   NA 138 01/03/2019 0222   K 3.9 01/03/2019 0222   CL 104 01/03/2019 0222   CO2 24 01/03/2019 0222   GLUCOSE 138 (H) 01/03/2019 0222   BUN 16 01/03/2019 0222   CREATININE 0.69 01/03/2019 0222   CALCIUM 8.4 (L) 01/03/2019 0222   GFRNONAA >60 01/03/2019 0222   GFRAA >60 01/03/2019 0222    Lab Results  Component Value Date   INR 1.06 10/21/2010   INR 1.03 05/21/2010   INR 1.1 08/20/2008    Assessment/Plan: POD #3 s/p Procedure(s): TOTAL HIP REVISION, FEMORAL REVISION, POSTERIOR APPROACH PT, OT, mobilization DVT prophylaxis, aspirin and mechanical  Doran Heater. Veverly Fells, MD 01/05/2019 9:25 AM

## 2019-01-05 NOTE — Progress Notes (Signed)
Pt continues to refuse BIPAP QHS.  RT to monitor and assess as needed.  

## 2019-01-05 NOTE — Progress Notes (Signed)
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    PT Comments    Pt very anxious during today's session causing pt to be self limiting. Pt is fearful of a leg length discrepancy and states "I do not want to be a cripple". Took the time to educate pt on LLD and assured her that her legs were even, but even if they were not most people with LLD function normally and are not "crippled". Fear of falling and anxiety limiting today session to short distance ambulation to recliner chair. Will continue to follow for mobility progression.      Follow Up Recommendations  Follow surgeon's recommendation for DC plan and follow-up therapies     Equipment Recommendations  None recommended by PT    Recommendations for Other Services       Precautions / Restrictions Precautions Precautions: Fall;Posterior Hip Precaution Comments: able to recall 1/3 hip precautions.  Required Braces or Orthoses: Knee Immobilizer - Right Knee Immobilizer - Right: (can be removed per PA notes, pt wished to leave on for first time OOB) Restrictions Weight Bearing Restrictions: No RLE Weight Bearing: Weight bearing as tolerated    Mobility  Bed Mobility Overal bed mobility: Needs Assistance Bed Mobility: Supine to Sit     Supine to sit: Min assist     General bed mobility comments: pt using belt a leg lifter to negotiate R LE off EOB. Assist given to power up and assist with lowering LE to floor.   Transfers Overall transfer level: Needs assistance Equipment used: Rolling walker (2 wheeled) Transfers: Sit to/from Stand Sit to Stand: Min assist         General transfer comment:  Cues for hand placement to rise from EOB and assist to power up.  Ambulation/Gait Ambulation/Gait assistance: Min assist Gait Distance (Feet): 3 Feet Assistive device: Rolling walker (2 wheeled) Gait Pattern/deviations: Step-to pattern;Decreased stance time - right;Decreased weight shift to right     General Gait Details: Pt taking small shuffling steps from EOB to recliner chair. Physical assist required to progress LLE and to [facility] RW. VC for RW safety, Pt very anxious and stating several times that she was "giving outAnimator    Modified Rankin (Stroke Patients Only)       Balance Overall balance assessment: Needs assistance;History of Falls           Standing balance-Leahy Scale: Poor Standing balance comment: reliant on UEs                            Cognition Arousal/Alertness: Awake/alert Behavior During Therapy: Anxious Overall Cognitive Status: Within Functional Limits for tasks assessed                                 General Comments: Pt very anxious with mobility today. At times crying and frustrated because she feels her R leg is longer than her L.  She feels this limits her ability to walk and states "I don't want to be  a cripple". Pt breating fast at times due to anxiety and cues to take slow deep breaths.      Exercises Total Joint Exercises Ankle Circles/Pumps: AROM;Both;10 reps Quad Sets: AROM;Both;10 reps Short Arc Quad: Right;10 reps;AAROM Heel Slides: AAROM;Right;10 reps Hip ABduction/ADduction: 10 reps;Right;AAROM    General Comments        Pertinent Vitals/Pain Pain Assessment: Faces Faces Pain Scale: Hurts little more Pain Location: pain now in incision area and posterior hip Pain Descriptors / Indicators: Sore;Grimacing;Aching Pain Intervention(s): Limited activity within patient's tolerance;Monitored during session;Repositioned    Home Living                       Prior Function            PT Goals (current goals can now be found in the care plan section) Acute Rehab PT Goals Patient Stated Goal: have right hip/thigh pain go away PT Goal Formulation: With patient Time For Goal Achievement: 01/16/19 Potential to Achieve Goals: Good Progress towards PT goals: Not progressing toward goals - comment(self limiting today and anxious)    Frequency    7X/week      PT Plan Current plan remains appropriate    Co-evaluation              AM-PAC PT "6 Clicks" Mobility   Outcome Measure  Help needed turning from your back to your side while in a flat bed without using bedrails?: A Little Help needed moving from lying on your back to sitting on the side of a flat bed without using bedrails?: A Little Help needed moving to and from a bed to a chair (including a wheelchair)?: A Little Help needed standing up from a chair using your arms (e.g., wheelchair or bedside chair)?: A Little Help needed to walk in hospital room?: A Lot Help needed climbing 3-5 steps with a railing? : A Lot 6 Click Score: 16    End of Session Equipment Utilized During Treatment: Gait belt Activity Tolerance: Other (comment)(limited by anxiety) Patient left: in chair;with call bell/phone within reach Nurse Communication: Mobility status PT Visit Diagnosis: Difficulty in walking, not elsewhere classified (R26.2)     Time: 2703-5009 PT Time Calculation (min) (ACUTE ONLY): 21 min  Charges:  $Therapeutic Activity: 8-22 mins                     Benjiman Core, [place] Pager [phone] Acute Rehab    Allena Katz 01/05/2019, 1:11 PM

## 2019-01-06 LAB — GLUCOSE, CAPILLARY
Glucose-Capillary: 81 mg/dL (ref 70–99)
Glucose-Capillary: 120 mg/dL — ABNORMAL HIGH (ref 70–99)
Glucose-Capillary: 192 mg/dL — ABNORMAL HIGH (ref 70–99)
Glucose-Capillary: 103 mg/dL — ABNORMAL HIGH (ref 70–99)

## 2019-01-06 NOTE — Progress Notes (Signed)
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    PT Comments    Pt continues cooperative and progressing slowly with mobility.  Pt ambulated increased distance in hall with frequent cues for technique and for slower more controlled movement and with pt demonstrating more erect posture and increased WB tolerance on R LE.   Follow Up Recommendations  Follow surgeon's recommendation for DC plan and follow-up therapies     Equipment Recommendations  None recommended by PT    Recommendations for Other Services       Precautions / Restrictions Precautions Precautions: Fall;Posterior Hip Precaution Comments: able to recall 2/3 hip precautions.  Required Braces or Orthoses: Knee Immobilizer - Right Restrictions Weight Bearing Restrictions: No RLE Weight Bearing: Weight bearing as tolerated    Mobility  Bed Mobility               General bed mobility comments: Up in chair and requests back to same  Transfers Overall transfer level: Needs assistance Equipment used: Rolling walker (2 wheeled) Transfers: Sit to/from Stand Sit to Stand: Min guard         General transfer comment: min guard to rise from recliner chair with heavy use of arm rests. Cues for technique and hand placement on decent into chair.  Ambulation/Gait Ambulation/Gait assistance: Min assist Gait Distance (Feet): 35 Feet(and additional 25') Assistive device: Rolling walker (2 wheeled) Gait Pattern/deviations: Step-to pattern;Decreased stance time - right;Decreased weight shift to right;Trunk flexed;Antalgic Gait velocity: decr   General Gait Details: cues for posture, foot  placement, to slow down for safety and encouraged increased WB and stance time on R;    Stairs             Wheelchair Mobility    Modified Rankin (Stroke Patients Only)       Balance Overall balance assessment: Needs assistance;History of Falls Sitting-balance support: No upper extremity supported;Feet supported Sitting balance-Leahy Scale: Good       Standing balance-Leahy Scale: Poor Standing balance comment: reliant on UEs                            Cognition Arousal/Alertness: Awake/alert Behavior During Therapy: Anxious Overall Cognitive Status: Within Functional Limits for tasks assessed                                 General Comments: Continues to be anxious, but calms down with reassurance that she will be able to walk again.      Exercises Total Joint Exercises Ankle Circles/Pumps: AROM;Both;15 reps;Supine Quad Sets: AROM;Both;10 reps Heel Slides: AAROM;Right;20 reps;Supine Hip ABduction/ADduction: Right;AAROM;15 reps;Supine    General Comments        Pertinent Vitals/Pain Pain Assessment: 0-10 Pain Score: 6  Pain Location: R hip/thigh Pain Descriptors / Indicators: Sore;Grimacing;Aching Pain Intervention(s): Limited activity within patient's tolerance;Monitored during session;Premedicated before session;Ice applied    Home Living                      Prior Function            PT Goals (  current goals can now be found in the care plan section) Acute Rehab PT Goals Patient Stated Goal: have right hip/thigh pain go away PT Goal Formulation: With patient Time For Goal Achievement: 01/16/19 Potential to Achieve Goals: Good Progress towards PT goals: Progressing toward goals    Frequency    7X/week      PT Plan Current plan remains appropriate    Co-evaluation              AM-PAC PT "6 Clicks" Mobility   Outcome Measure  Help needed turning from your back to your side while in a flat bed  without using bedrails?: A Little Help needed moving from lying on your back to sitting on the side of a flat bed without using bedrails?: A Little Help needed moving to and from a bed to a chair (including a wheelchair)?: A Little Help needed standing up from a chair using your arms (e.g., wheelchair or bedside chair)?: A Little Help needed to walk in hospital room?: A Little Help needed climbing 3-5 steps with a railing? : A Lot 6 Click Score: 17    End of Session Equipment Utilized During Treatment: Gait belt Activity Tolerance: Patient tolerated treatment well Patient left: in chair;with call bell/phone within reach Nurse Communication: Mobility status PT Visit Diagnosis: Difficulty in walking, not elsewhere classified (R26.2)     Time: 1610-9604 PT Time Calculation (min) (ACUTE ONLY): 30 min  Charges:  $Gait Training: 23-37 mins $Therapeutic Exercise: 8-22 mins                     [place] Pager [phone] Office [phone]    Teran Daughenbaugh 01/06/2019, 2:52 PM

## 2019-01-06 NOTE — Progress Notes (Signed)
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    PT Comments    Pt very motivated to progress but very anxious and fixated on how the pain is different from previous surgeries and how the R LE feels shorter than L.  Pt also moves impulsively and requiring frequent cueing to slow pace for safety and control.  Pt progressing slowly but steadily with mobility.   Follow Up Recommendations  Follow surgeon's recommendation for DC plan and follow-up therapies     Equipment Recommendations  None recommended by PT    Recommendations for Other Services       Precautions / Restrictions Precautions Precautions: Fall;Posterior Hip Precaution Comments: able to recall 2/3 hip precautions.  Restrictions Weight Bearing Restrictions: No RLE Weight Bearing: Weight bearing as tolerated    Mobility  Bed Mobility               General bed mobility comments: Up in chair and requests back to same  Transfers Overall transfer level: Needs assistance Equipment used: Rolling walker (2 wheeled) Transfers: Sit to/from Stand Sit to Stand: Min guard         General transfer comment: min guard to rise from recliner chair with heavy use of arm rests. Cues for technique and hand placement on decent into chair.  Ambulation/Gait Ambulation/Gait assistance: Min assist Gait Distance (Feet): 25 Feet(and additional 18') Assistive device: Rolling walker (2 wheeled) Gait Pattern/deviations: Step-to pattern;Decreased stance time - right;Decreased weight shift to right;Trunk flexed;Antalgic Gait velocity: decr   General Gait Details: cues for posture, foot placement, to slow  down for safety and encouraged increased WB and stance time on R;    Stairs             Wheelchair Mobility    Modified Rankin (Stroke Patients Only)       Balance Overall balance assessment: Needs assistance;History of Falls Sitting-balance support: No upper extremity supported;Feet supported Sitting balance-Leahy Scale: Good       Standing balance-Leahy Scale: Poor Standing balance comment: reliant on UEs                            Cognition Arousal/Alertness: Awake/alert Behavior During Therapy: Anxious Overall Cognitive Status: Within Functional Limits for tasks assessed                                 General Comments: Continues to be anxious, but calms down with reassurance that she will be able to walk again.      Exercises Total Joint Exercises Ankle Circles/Pumps: AROM;Both;15 reps;Supine Quad Sets: AROM;Both;10 reps Heel Slides: AAROM;Right;20 reps;Supine Hip ABduction/ADduction: Right;AAROM;15 reps;Supine    General Comments        Pertinent Vitals/Pain Pain Assessment: 0-10 Pain Score: 4  Pain Location: R hip/thigh Pain Descriptors / Indicators: Sore;Grimacing;Aching Pain Intervention(s): Limited activity within patient's tolerance    Home Living                      Prior Function            PT Goals (current  goals can now be found in the care plan section) Acute Rehab PT Goals Patient Stated Goal: have right hip/thigh pain go away PT Goal Formulation: With patient Time For Goal Achievement: 01/16/19 Potential to Achieve Goals: Good Progress towards PT goals: Progressing toward goals    Frequency    7X/week      PT Plan Current plan remains appropriate    Co-evaluation              AM-PAC PT "6 Clicks" Mobility   Outcome Measure  Help needed turning from your back to your side while in a flat bed without using bedrails?: A Little Help needed moving from lying on your back to  sitting on the side of a flat bed without using bedrails?: A Little Help needed moving to and from a bed to a chair (including a wheelchair)?: A Little Help needed standing up from a chair using your arms (e.g., wheelchair or bedside chair)?: A Little Help needed to walk in hospital room?: A Lot Help needed climbing 3-5 steps with a railing? : A Lot 6 Click Score: 16    End of Session Equipment Utilized During Treatment: Gait belt Activity Tolerance: Patient tolerated treatment well Patient left: in chair;with call bell/phone within reach Nurse Communication: Mobility status PT Visit Diagnosis: Difficulty in walking, not elsewhere classified (R26.2)     Time: 2355-7322 PT Time Calculation (min) (ACUTE ONLY): 40 min  Charges:  $Gait Training: 23-37 mins $Therapeutic Exercise: 8-22 mins                     Mauro Kaufmann PT Acute Rehabilitation Services Pager [phone] Office [phone]    Ryott Rafferty 01/06/2019, 12:37 PM

## 2019-01-06 NOTE — TOC Progression Note (Signed)
Transition of Care [facility]) - Progression Note    Patient Details  Name: Anne Bryant MRN: [id number] Date of Birth: 1945/06/25  Transition of Care [facility]) CM/SW [place],  Phone Number: [phone] weekend coverage 01/06/2019, 1:35 PM  Clinical Narrative:   Pt awaiting bed offers for SNF, prefers [facility].  Will need [employer] Medicare auth once facility selected.          Expected Discharge Plan and Services           Expected Discharge Date: TBD                                  Social Determinants of Health (SDOH) Interventions    Readmission Risk Interventions No flowsheet data found.

## 2019-01-06 NOTE — Progress Notes (Addendum)
Pt remains stable with no needs. Medicating for pain as needed.

## 2019-01-06 NOTE — Plan of Care (Signed)
  Problem: Pain Management: Goal: Pain level will decrease with appropriate interventions Outcome: Progressing   Problem: Skin Integrity: Goal: Will show signs of wound healing Outcome: Progressing   Problem: Clinical Measurements: Goal: Will remain free from infection Outcome: Progressing   Problem: Pain Managment: Goal: General experience of comfort will improve Outcome: Progressing   Problem: Skin Integrity: Goal: Risk for impaired skin integrity will decrease Outcome: Progressing

## 2019-01-06 NOTE — Progress Notes (Signed)
Pt continues to refuse BIPAP QHS.  RT to monitor and assess as needed.  

## 2019-01-06 NOTE — Progress Notes (Addendum)
Subjective: 5 Days Post-Op Procedure(s) (LRB): TOTAL HIP REVISION, FEMORAL REVISION, POSTERIOR APPROACH (Right) Patient reports pain as moderate.   Patient is still very frustrated with her recovery at this time. Having difficulty walking. She is concerned that her operative leg is shorter than no-op leg.   Objective: Vital signs in last 24 hours: Temp:  [98.1 F (36.7 C)] 98.1 F (36.7 C) (08/30 0417) Pulse Rate:  [61-78] 61 (08/30 0417) Resp:  [16] 16 (08/30 0417) BP: (120-134)/(72-76) 120/72 (08/30 0417) SpO2:  [97 %-100 %] 100 % (08/30 0417)  Intake/Output from previous day: 08/29 0701 - 08/30 0700 In: 360 [P.O.:360] Out: 750 [Urine:750] Intake/Output this shift: Total I/O In: 600 [P.O.:600] Out: -   No results for input(s): HGB in the last 72 hours. No results for input(s): WBC, RBC, HCT, PLT in the last 72 hours. No results for input(s): NA, K, CL, CO2, BUN, CREATININE, GLUCOSE, CALCIUM in the last 72 hours. No results for input(s): LABPT, INR in the last 72 hours.  Neurologically intact Neurovascular intact Sensation intact distally Intact pulses distally Dorsiflexion/Plantar flexion intact Incision: dressing C/D/I Compartment soft  Patient is lying comfortably in bed. Patient is having difficulty with right hip range of motion.  She has weakness with hip abduction abduction flexion and extension, no pain. No gross abnormality in leg length or external rotation noted when compared to contralateral side. She is able to flex and extend at her right knee, dorsiflexion plantarflexion of the right ankle with no pain.  Calf is supple.   Assessment/Plan: 5 Days Post-Op Procedure(s) (LRB): TOTAL HIP REVISION, FEMORAL REVISION, POSTERIOR APPROACH (Right) PT/ OT mobilization, WBAT right lower extremity DVT prophylaxis         Cordelia Pen Davone Shinault 01/06/2019, 8:36 AM

## 2019-01-07 ENCOUNTER — Inpatient Hospital Stay (HOSPITAL_COMMUNITY): Payer: Medicare Other | Admitting: Certified Registered Nurse Anesthetist

## 2019-01-07 ENCOUNTER — Inpatient Hospital Stay (HOSPITAL_COMMUNITY): Payer: Medicare Other

## 2019-01-07 ENCOUNTER — Encounter (HOSPITAL_COMMUNITY)
Admission: RE | Disposition: A | Discharge status: Skilled Nursing Facility | Payer: Self-pay | Source: Home / Self Care | Attending: Orthopedic Surgery

## 2019-01-07 HISTORY — PX: TOTAL HIP REVISION: SHX763

## 2019-01-07 LAB — CBC
HCT: 30.4 % — ABNORMAL LOW (ref 36.0–46.0)
Hemoglobin: 8.9 g/dL — ABNORMAL LOW (ref 12.0–15.0)
MCH: 22 pg — ABNORMAL LOW (ref 26.0–34.0)
MCHC: 29.3 g/dL — ABNORMAL LOW (ref 30.0–36.0)
MCV: 75.1 fL — ABNORMAL LOW (ref 80.0–100.0)
Platelets: 241 10*3/uL (ref 150–400)
RBC: 4.05 MIL/uL (ref 3.87–5.11)
RDW: 18 % — ABNORMAL HIGH (ref 11.5–15.5)
WBC: 8.6 10*3/uL (ref 4.0–10.5)
nRBC: 0.7 % — ABNORMAL HIGH (ref 0.0–0.2)

## 2019-01-07 LAB — GLUCOSE, CAPILLARY
Glucose-Capillary: 117 mg/dL — ABNORMAL HIGH (ref 70–99)
Glucose-Capillary: 148 mg/dL — ABNORMAL HIGH (ref 70–99)
Glucose-Capillary: 80 mg/dL (ref 70–99)
Glucose-Capillary: 92 mg/dL (ref 70–99)

## 2019-01-07 SURGERY — TOTAL HIP REVISION
Anesthesia: General | Site: Hip | Laterality: Right

## 2019-01-07 MED ORDER — FENTANYL CITRATE (PF) 100 MCG/2ML IJ SOLN
INTRAMUSCULAR | Status: DC | PRN
  Administered 2019-01-07: 50 ug via INTRAVENOUS

## 2019-01-07 MED ORDER — PROPOFOL 500 MG/50ML IV EMUL
INTRAVENOUS | Status: DC | PRN
  Administered 2019-01-07: 25 ug/kg/min via INTRAVENOUS

## 2019-01-07 MED ORDER — ONDANSETRON HCL 4 MG/2ML IJ SOLN: 4.0000 mg | Freq: Four times a day (QID) | INTRAMUSCULAR | Status: DC | PRN

## 2019-01-07 MED ORDER — PHENYLEPHRINE 40 MCG/ML (10ML) SYRINGE FOR IV PUSH (FOR BLOOD PRESSURE SUPPORT)
PREFILLED_SYRINGE | INTRAVENOUS | Status: AC
  Filled 2019-01-07: qty 10

## 2019-01-07 MED ORDER — FENTANYL CITRATE (PF) 250 MCG/5ML IJ SOLN
INTRAMUSCULAR | Status: AC
  Filled 2019-01-07: qty 5

## 2019-01-07 MED ORDER — PHENYLEPHRINE 40 MCG/ML (10ML) SYRINGE FOR IV PUSH (FOR BLOOD PRESSURE SUPPORT)
PREFILLED_SYRINGE | INTRAVENOUS | Status: AC
  Filled 2019-01-07: qty 30

## 2019-01-07 MED ORDER — BUPIVACAINE IN DEXTROSE 0.75-8.25 % IT SOLN
INTRATHECAL | Status: DC | PRN
  Administered 2019-01-07: 1.6 mL via INTRATHECAL

## 2019-01-07 MED ORDER — ONDANSETRON HCL 4 MG/2ML IJ SOLN
INTRAMUSCULAR | Status: AC
  Filled 2019-01-07: qty 2

## 2019-01-07 MED ORDER — 0.9 % SODIUM CHLORIDE (POUR BTL) OPTIME
TOPICAL | Status: DC | PRN
  Administered 2019-01-07: 1000 mL

## 2019-01-07 MED ORDER — PROPOFOL 10 MG/ML IV BOLUS
INTRAVENOUS | Status: AC
  Filled 2019-01-07: qty 40

## 2019-01-07 MED ORDER — OXYCODONE HCL 5 MG PO TABS: 5.0000 mg | ORAL_TABLET | Freq: Once | ORAL | Status: DC | PRN

## 2019-01-07 MED ORDER — EPHEDRINE 5 MG/ML INJ
INTRAVENOUS | Status: AC
  Filled 2019-01-07: qty 10

## 2019-01-07 MED ORDER — SODIUM CHLORIDE 0.9 % IR SOLN
Status: DC | PRN
  Administered 2019-01-07: 3000 mL

## 2019-01-07 MED ORDER — CEFAZOLIN SODIUM-DEXTROSE 2-4 GM/100ML-% IV SOLN
INTRAVENOUS | Status: AC
  Filled 2019-01-07: qty 100

## 2019-01-07 MED ORDER — PHENYLEPHRINE 40 MCG/ML (10ML) SYRINGE FOR IV PUSH (FOR BLOOD PRESSURE SUPPORT)
PREFILLED_SYRINGE | INTRAVENOUS | Status: DC | PRN
  Administered 2019-01-07: 80 ug via INTRAVENOUS
  Administered 2019-01-07 (×2): 120 ug via INTRAVENOUS
  Administered 2019-01-07: 80 ug via INTRAVENOUS
  Administered 2019-01-07 (×4): 120 ug via INTRAVENOUS

## 2019-01-07 MED ORDER — DEXAMETHASONE SODIUM PHOSPHATE 10 MG/ML IJ SOLN
INTRAMUSCULAR | Status: AC
  Filled 2019-01-07: qty 1

## 2019-01-07 MED ORDER — CEFAZOLIN SODIUM-DEXTROSE 2-3 GM-%(50ML) IV SOLR
INTRAVENOUS | Status: DC | PRN
  Administered 2019-01-07: 2 g via INTRAVENOUS

## 2019-01-07 MED ORDER — FENTANYL CITRATE (PF) 100 MCG/2ML IJ SOLN
25.0000 ug | INTRAMUSCULAR | Status: DC | PRN
  Administered 2019-01-07: 50 ug via INTRAVENOUS
  Administered 2019-01-08: 25 ug via INTRAVENOUS

## 2019-01-07 MED ORDER — SODIUM CHLORIDE 0.9 % IV SOLN
INTRAVENOUS | Status: DC | PRN
  Administered 2019-01-07: 22:00:00 40 ug/min via INTRAVENOUS

## 2019-01-07 MED ORDER — LACTATED RINGERS IV SOLN
INTRAVENOUS | Status: DC | PRN
  Administered 2019-01-07 (×2): via INTRAVENOUS

## 2019-01-07 MED ORDER — ONDANSETRON HCL 4 MG/2ML IJ SOLN
INTRAMUSCULAR | Status: DC | PRN
  Administered 2019-01-07: 4 mg via INTRAVENOUS

## 2019-01-07 MED ORDER — ACETAMINOPHEN 10 MG/ML IV SOLN
INTRAVENOUS | Status: DC | PRN
  Administered 2019-01-07: 1000 mg via INTRAVENOUS

## 2019-01-07 MED ORDER — EPHEDRINE SULFATE-NACL 50-0.9 MG/10ML-% IV SOSY
PREFILLED_SYRINGE | INTRAVENOUS | Status: DC | PRN
  Administered 2019-01-07: 5 mg via INTRAVENOUS

## 2019-01-07 MED ORDER — STERILE WATER FOR IRRIGATION IR SOLN
Status: DC | PRN
  Administered 2019-01-07: 2000 mL

## 2019-01-07 MED ORDER — TRANEXAMIC ACID-NACL 1000-0.7 MG/100ML-% IV SOLN
INTRAVENOUS | Status: AC
  Filled 2019-01-07: qty 100

## 2019-01-07 MED ORDER — ACETAMINOPHEN 10 MG/ML IV SOLN
INTRAVENOUS | Status: AC
  Filled 2019-01-07: qty 100

## 2019-01-07 MED ORDER — ROCURONIUM BROMIDE 10 MG/ML (PF) SYRINGE
PREFILLED_SYRINGE | INTRAVENOUS | Status: AC
  Filled 2019-01-07: qty 10

## 2019-01-07 MED ORDER — SUCCINYLCHOLINE CHLORIDE 200 MG/10ML IV SOSY
PREFILLED_SYRINGE | INTRAVENOUS | Status: AC
  Filled 2019-01-07: qty 10

## 2019-01-07 MED ORDER — LIDOCAINE 2% (20 MG/ML) 5 ML SYRINGE
INTRAMUSCULAR | Status: AC
  Filled 2019-01-07: qty 5

## 2019-01-07 MED ORDER — OXYCODONE HCL 5 MG/5ML PO SOLN: 5.0000 mg | Freq: Once | ORAL | Status: DC | PRN

## 2019-01-07 MED ORDER — FENTANYL CITRATE (PF) 100 MCG/2ML IJ SOLN
INTRAMUSCULAR | Status: AC
  Filled 2019-01-07: qty 2

## 2019-01-07 MED ORDER — PROPOFOL 10 MG/ML IV BOLUS
INTRAVENOUS | Status: DC | PRN
  Administered 2019-01-07: 20 mg via INTRAVENOUS

## 2019-01-07 SURGICAL SUPPLY — 44 items
BLADE SAW SGTL 11.0X1.19X90.0M (BLADE) IMPLANT
BNDG ADH 1X3 SHEER STRL LF (GAUZE/BANDAGES/DRESSINGS) IMPLANT
COVER SURGICAL LIGHT HANDLE (MISCELLANEOUS) ×2 IMPLANT
COVER WAND RF STERILE (DRAPES) IMPLANT
CUP ACETBLR 52 OD PINNACLE (Hips) ×1 IMPLANT
DERMABOND ADVANCED (GAUZE/BANDAGES/DRESSINGS) ×1
DERMABOND ADVANCED .7 DNX12 (GAUZE/BANDAGES/DRESSINGS) IMPLANT
DRAPE INCISE IOBAN 66X45 STRL (DRAPES) ×1 IMPLANT
DRAPE ORTHO SPLIT 77X108 STRL (DRAPES) ×2
DRAPE POUCH INSTRU U-SHP 10X18 (DRAPES) ×1 IMPLANT
DRAPE SURG ORHT 6 SPLT 77X108 (DRAPES) IMPLANT
DRAPE U-SHAPE 47X51 STRL (DRAPES) ×1 IMPLANT
DRSG AQUACEL AG ADV 3.5X14 (GAUZE/BANDAGES/DRESSINGS) ×1 IMPLANT
DURAPREP 26ML APPLICATOR (WOUND CARE) ×2 IMPLANT
ELIMINATOR HOLE APEX DEPUY (Hips) ×1 IMPLANT
FACESHIELD WRAPAROUND (MASK) ×10 IMPLANT
FACESHIELD WRAPAROUND OR TEAM (MASK) IMPLANT
GAUZE SPONGE 4X4 12PLY STRL (GAUZE/BANDAGES/DRESSINGS) IMPLANT
GLOVE BIOGEL M 7.0 STRL (GLOVE) IMPLANT
GLOVE BIOGEL PI IND STRL 7.5 (GLOVE) ×1 IMPLANT
GLOVE BIOGEL PI IND STRL 8.5 (GLOVE) ×1 IMPLANT
GLOVE BIOGEL PI INDICATOR 7.5 (GLOVE) ×1
GLOVE BIOGEL PI INDICATOR 8.5 (GLOVE) ×1
GLOVE ECLIPSE 8.0 STRL XLNG CF (GLOVE) IMPLANT
GLOVE ORTHO TXT STRL SZ7.5 (GLOVE) ×4 IMPLANT
GLOVE SURG ORTHO 8.0 STRL STRW (GLOVE) ×2 IMPLANT
GOWN STRL REUS W/TWL LRG LVL3 (GOWN DISPOSABLE) ×2 IMPLANT
GOWN STRL REUS W/TWL XL LVL3 (GOWN DISPOSABLE) ×4 IMPLANT
HEAD CERAMIC BIO DELTA 36 (Hips) ×1 IMPLANT
IMMOBILIZER KNEE 20 (SOFTGOODS) ×2 IMPLANT
IMMOBILIZER KNEE 20 THIGH 36 (SOFTGOODS) IMPLANT
KIT TURNOVER KIT A (KITS) IMPLANT
LINER NEUTRAL 52X36MM PLUS 4 (Liner) ×1 IMPLANT
MANIFOLD NEPTUNE II (INSTRUMENTS) ×2 IMPLANT
NDL SAFETY ECLIPSE 18X1.5 (NEEDLE) IMPLANT
NEEDLE HYPO 18GX1.5 SHARP (NEEDLE)
PASSER SUT SWANSON 36MM LOOP (INSTRUMENTS) ×1 IMPLANT
PROTECTOR NERVE ULNAR (MISCELLANEOUS) ×2 IMPLANT
SCREW 6.5MMX35MM (Screw) ×1 IMPLANT
SCREW 6.5MMX40MM (Screw) ×1 IMPLANT
SUT MNCRL AB 3-0 PS2 18 (SUTURE) ×3 IMPLANT
SYR CONTROL 10ML LL (SYRINGE) IMPLANT
TOWEL OR 17X26 10 PK STRL BLUE (TOWEL DISPOSABLE) ×4 IMPLANT
TRAY FOLEY MTR SLVR 16FR STAT (SET/KITS/TRAYS/PACK) ×1 IMPLANT

## 2019-01-07 NOTE — Progress Notes (Addendum)
I was informed by x-ray tech that the pt's hip is dislocated. Paralee Cancel, MD was paged/informed.   Matt babish, PA gave verbal orders for NPO at 1255. Pt has already eaten lunch, but is aware of NPO status and has been educated.

## 2019-01-07 NOTE — Transfer of Care (Signed)
Immediate Anesthesia Transfer of Care Note  Patient: Endia M Channell  Procedure(s) Performed: TOTAL HIP REVISION (Right Hip)  Patient Location: PACU  Anesthesia Type:MAC and Spinal  Level of Consciousness: awake and patient cooperative  Airway & Oxygen Therapy: Patient Spontanous Breathing and Patient connected to face mask oxygen  Post-op Assessment: Report given to RN and Post -op Vital signs reviewed and stable  Post vital signs: Reviewed and stable  Last Vitals:  Vitals Value Taken Time  BP 105/70 01/07/19 2340  Temp 36.7 C 01/07/19 2336  Pulse 72 01/07/19 2342  Resp 12 01/07/19 2342  SpO2 100 % 01/07/19 2342  Vitals shown include unvalidated device data.  Last Pain:  Vitals:   01/07/19 1555  TempSrc:   PainSc: 3       Patients Stated Pain Goal: 3 (89/79/15 0413)  Complications: No apparent anesthesia complications

## 2019-01-07 NOTE — Anesthesia Preprocedure Evaluation (Signed)
Anesthesia Evaluation  Patient identified by MRN, date of birth, ID band Patient awake    Reviewed: Allergy & Precautions, H&P , NPO status , Patient's Chart, lab work & pertinent test results  Airway Mallampati: II   Neck ROM: full    Dental   Pulmonary shortness of breath, asthma , sleep apnea , former smoker,    breath sounds clear to auscultation       Cardiovascular hypertension,  Rhythm:regular Rate:Normal     Neuro/Psych  Headaches, PSYCHIATRIC DISORDERS Anxiety Depression  Neuromuscular disease    GI/Hepatic GERD  ,  Endo/Other  diabetes, Type 2  Renal/GU      Musculoskeletal  (+) Arthritis , Fibromyalgia -  Abdominal   Peds  Hematology  (+) anemia ,   Anesthesia Other Findings   Reproductive/Obstetrics                             Anesthesia Physical Anesthesia Plan  ASA: III  Anesthesia Plan: General   Post-op Pain Management:    Induction: Intravenous, Rapid sequence and Cricoid pressure planned  PONV Risk Score and Plan: 3 and Ondansetron, Dexamethasone and Treatment may vary due to age or medical condition  Airway Management Planned: Oral ETT  Additional Equipment:   Intra-op Plan:   Post-operative Plan: Extubation in OR  Informed Consent: I have reviewed the patients History and Physical, chart, labs and discussed the procedure including the risks, benefits and alternatives for the proposed anesthesia with the patient or authorized representative who has indicated his/her understanding and acceptance.       Plan Discussed with: CRNA, Anesthesiologist and Surgeon  Anesthesia Plan Comments:         Anesthesia Quick Evaluation

## 2019-01-07 NOTE — Progress Notes (Signed)
Pt is on bedrest d/t hip dislocation. Pure wick applied. Knee immobilizer on.

## 2019-01-07 NOTE — Progress Notes (Signed)
Patient ID: Anne Bryant, female   DOB: 04/23/1946, 73 y.o.   MRN: [id number]  Based on complaints/concerns about her right leg feeling shorter and examination findings I ordered an X-ray today that revealed a dislocation of her right hip.  Based on the recent revision surgery and despite intra-operative stability initially achieved I feel it is our best interest to perform an open reduction so as to better evaluate the hip joint with the capability to revise components as necessary based on findings.  I reviewed my thoughts with [name] and she concurs that this seems to be the best plan versus a simple closed reduction.  Consent ordered Repeat CBC reveal Hgb of 8.9 

## 2019-01-07 NOTE — Brief Op Note (Signed)
01/01/2019 - 01/07/2019  9:02 PM  PATIENT:  Dannial Monarch M [place]  73 y.o. 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   Delay start of Pharmacological VTE agent (>24hrs) due to surgical blood loss or risk of bleeding: no

## 2019-01-07 NOTE — Progress Notes (Signed)
     Subjective: 6 Days Post-Op Procedure(s) (LRB): TOTAL HIP REVISION, FEMORAL REVISION, POSTERIOR APPROACH (Right)   Patient reports pain as moderate, with no movement.  Not able to ambulate on the leg without pain. Discussed the possible reason for the incident and expectations moving forward.     Objective:   VITALS:   Vitals:   01/07/19 0601 01/07/19 1412  BP: 139/80 115/62  Pulse: 72 71  Resp: 18 17  Temp: 98 F (36.7 C) 98.1 F (36.7 C)  SpO2: 98% 97%    Right leg is short and externally rotated Dorsiflexion/Plantar flexion intact Incision: dressing C/D/I No cellulitis present Compartment soft  LABS No results for input(s): HGB, HCT, WBC, PLT in the last 72 hours.  No results for input(s): NA, K, BUN, CREATININE, GLUCOSE in the last 72 hours.   Assessment/Plan: 6 Days Post-Op Procedure(s) (LRB): TOTAL HIP REVISION, FEMORAL REVISION, POSTERIOR APPROACH (Right)   Imaging obtained this morning revealed and dislocation of the right hip Dr. Alvan Dame plans on returning to the [place] for a closed reduction / manipulation of the right hip NPO now I have discussed and answered her concerns of the type of anesthesia       [place] Pugh. Jordana Dugue   PAC  01/07/2019, 2:18 PM

## 2019-01-07 NOTE — TOC Progression Note (Addendum)
Transition of Care [facility]) - Progression Note    Patient Details  Name: Anne Bryant MRN: [id number] Date of Birth: 1946/01/15  Transition of Care [facility]) CM/SW [place], [place] Phone Number: 01/07/2019, 10:26 AM  Clinical Narrative:    Patient will need updated COVID-19 test prior to discharge to [facility] Insurance Authorization at [facility] initiated on Saturday 8/29.         Expected Discharge Plan and Services  SNF- [facility]          Expected Discharge Date: TBD                                    Social Determinants of Health (SDOH) Interventions    Readmission Risk Interventions No flowsheet data found.

## 2019-01-07 NOTE — Progress Notes (Signed)
At 1500, the pt was provided with the surgical consent. The pt reported no questions or concerns.

## 2019-01-07 NOTE — Interval H&P Note (Signed)
History and Physical Interval Note:  01/07/2019 8:55 PM  Anne Bryant  has presented today for surgery, with the diagnosis of Oppelo.  The various methods of treatment have been discussed with the patient and family. After consideration of risks, benefits and other options for treatment, the patient has consented to  Procedure(s): TOTAL HIP REVISION (Right) as a surgical intervention.  The patient's history has been reviewed, patient examined, no change in status, stable for surgery.  I have reviewed the patient's chart and labs.  Questions were answered to the patient's satisfaction.     Mauri Pole

## 2019-01-07 NOTE — Progress Notes (Signed)
PT Cancellation Note  Patient Details Name: Anne Bryant MRN: [id number] DOB: 04/22/1946   Cancelled Treatment:    Reason Eval/Treat Not Completed: Medical issues which prohibited therapy  Pt currently on bedrest.  Will await plan of care.   Kiwanna Spraker,KATHrine E 01/07/2019, 2:00 PM Carmelia Bake, PT, DPT Acute Rehabilitation Services Office: [phone] Pager: [phone]

## 2019-01-07 NOTE — Anesthesia Procedure Notes (Signed)
Spinal  Patient location during procedure: OR Start time: 01/07/2019 9:40 PM End time: 01/07/2019 9:42 PM Staffing Anesthesiologist: Albertha Ghee, MD Performed: anesthesiologist  Preanesthetic Checklist Completed: patient identified, surgical consent, pre-op evaluation, timeout performed, IV checked, risks and benefits discussed and monitors and equipment checked Spinal Block Patient position: sitting Prep: DuraPrep Patient monitoring: cardiac monitor, continuous pulse ox and blood pressure Approach: midline Location: L3-4 Injection technique: single-shot Needle Needle type: Pencan  Needle gauge: 24 G Needle length: 9 cm Assessment Sensory level: T10 Additional Notes Functioning IV was confirmed and monitors were applied. Sterile prep and drape, including hand hygiene and sterile gloves were used. The patient was positioned and the spine was prepped. The skin was anesthetized with lidocaine.  Free flow of clear CSF was obtained prior to injecting local anesthetic into the CSF.  The spinal needle aspirated freely following injection.  The needle was carefully withdrawn.  The patient tolerated the procedure well.

## 2019-01-07 NOTE — Care Management Important Message (Signed)
Important Message  Patient Details IM Letter given to [name] SW to present to the Patient Name: Anne Bryant MRN: [id number] Date of Birth: April 27, 1946   Medicare Important Message Given:  Yes     Kerin Salen 01/07/2019, 12:27 PM

## 2019-01-07 NOTE — H&P (View-Only) (Signed)
Patient ID: Anne Bryant, female   DOB: July 27, 1945, 73 y.o.   MRN: [id number]  Based on complaints/concerns about her right leg feeling shorter and examination findings I ordered an X-ray today that revealed a dislocation of her right hip.  Based on the recent revision surgery and despite intra-operative stability initially achieved I feel it is our best interest to perform an open reduction so as to better evaluate the hip joint with the capability to revise components as necessary based on findings.  I reviewed my thoughts with [name] and she concurs that this seems to be the best plan versus a simple closed reduction.  Consent ordered Repeat CBC reveal Hgb of 8.9

## 2019-01-07 NOTE — Progress Notes (Signed)
Pt continues to refuse BIPAP QHS.  RT to monitor and assess as needed.  

## 2019-01-08 ENCOUNTER — Encounter (HOSPITAL_COMMUNITY): Payer: Self-pay | Admitting: Orthopedic Surgery

## 2019-01-08 LAB — CBC
HCT: 27.8 % — ABNORMAL LOW (ref 36.0–46.0)
Hemoglobin: 8 g/dL — ABNORMAL LOW (ref 12.0–15.0)
MCH: 22 pg — ABNORMAL LOW (ref 26.0–34.0)
MCHC: 28.8 g/dL — ABNORMAL LOW (ref 30.0–36.0)
MCV: 76.4 fL — ABNORMAL LOW (ref 80.0–100.0)
Platelets: 227 10*3/uL (ref 150–400)
RBC: 3.64 MIL/uL — ABNORMAL LOW (ref 3.87–5.11)
RDW: 17.9 % — ABNORMAL HIGH (ref 11.5–15.5)
WBC: 8.4 10*3/uL (ref 4.0–10.5)
nRBC: 0.5 % — ABNORMAL HIGH (ref 0.0–0.2)

## 2019-01-08 LAB — BASIC METABOLIC PANEL
Anion gap: 7 (ref 5–15)
BUN: 17 mg/dL (ref 8–23)
CO2: 26 mmol/L (ref 22–32)
Calcium: 8.2 mg/dL — ABNORMAL LOW (ref 8.9–10.3)
Chloride: 104 mmol/L (ref 98–111)
Creatinine, Ser: 0.67 mg/dL (ref 0.44–1.00)
GFR calc Af Amer: >60 mL/min (ref >60)
GFR calc non Af Amer: >60 mL/min (ref >60)
Glucose, Bld: 196 mg/dL — ABNORMAL HIGH (ref 70–99)
Potassium: 4.7 mmol/L (ref 3.5–5.1)
Sodium: 137 mmol/L (ref 135–145)

## 2019-01-08 LAB — GLUCOSE, CAPILLARY
Glucose-Capillary: 181 mg/dL — ABNORMAL HIGH (ref 70–99)
Glucose-Capillary: 247 mg/dL — ABNORMAL HIGH (ref 70–99)
Glucose-Capillary: 195 mg/dL — ABNORMAL HIGH (ref 70–99)
Glucose-Capillary: 159 mg/dL — ABNORMAL HIGH (ref 70–99)

## 2019-01-08 MED ORDER — CEFAZOLIN SODIUM-DEXTROSE 2-4 GM/100ML-% IV SOLN
2.0000 g | Freq: Four times a day (QID) | INTRAVENOUS | Status: AC
  Administered 2019-01-08 (×2): 2 g via INTRAVENOUS
  Filled 2019-01-08 (×2): qty 100

## 2019-01-08 MED ORDER — SODIUM CHLORIDE 0.9 % IV SOLN
INTRAVENOUS | Status: DC
  Administered 2019-01-08: 01:00:00 via INTRAVENOUS

## 2019-01-08 MED ORDER — ONDANSETRON HCL 4 MG/2ML IJ SOLN: 4.0000 mg | Freq: Four times a day (QID) | INTRAMUSCULAR | Status: DC | PRN

## 2019-01-08 MED ORDER — DOCUSATE SODIUM 100 MG PO CAPS
100.0000 mg | ORAL_CAPSULE | Freq: Two times a day (BID) | ORAL | Status: DC
  Administered 2019-01-08 – 2019-01-10 (×3): 100 mg via ORAL
  Filled 2019-01-08 (×5): qty 1

## 2019-01-08 MED ORDER — TRANEXAMIC ACID-NACL 1000-0.7 MG/100ML-% IV SOLN
1000.0000 mg | Freq: Once | INTRAVENOUS | Status: AC
  Administered 2019-01-08: 1000 mg via INTRAVENOUS
  Filled 2019-01-08: qty 100

## 2019-01-08 MED ORDER — ONDANSETRON HCL 4 MG PO TABS: 4.0000 mg | ORAL_TABLET | Freq: Four times a day (QID) | ORAL | Status: DC | PRN

## 2019-01-08 MED ORDER — CEFAZOLIN SODIUM-DEXTROSE 1-4 GM/50ML-% IV SOLN: 1.0000 g | Freq: Once | INTRAVENOUS | Status: DC

## 2019-01-08 NOTE — Evaluation (Signed)
Physical Therapy Evaluation Patient Details Name: Anne Bryant MRN: [id number] DOB: 1945-11-30 Today's Date: 01/08/2019   History of Present Illness  Pt is a 73 year old female s/p Right DA THA 3 weeks ago and currently s/p right posterior approach femoral hip revision 01/01/19 due to Right periprosthetic femur fracture with failed femoral component with subsidence. Pt found to have right hip dislocation during admission and s/p Right hip revision on 01/07/19.  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  Clinical Impression  Patient is s/p above surgery resulting in functional limitations due to the deficits listed below (see PT Problem List).  Patient will benefit from skilled PT to increase their independence and safety with mobility to allow discharge to the venue listed below.  Reviewed posterior hip precautions during mobility and while resting in bed.  Pt aware of PWB status and performs well with RW.  Pt assisted with ambulating however only tolerated short distance.  Pt reports plan is now for d/c to SNF.     Follow Up Recommendations Follow surgeon's recommendation for DC plan and follow-up therapies(plan now for SNF)    Equipment Recommendations  None recommended by PT    Recommendations for Other Services       Precautions / Restrictions Precautions Precautions: Fall;Posterior Hip Precaution Comments: discussed posterior hip precautions when in bed and with mobilizing Restrictions Weight Bearing Restrictions: Yes RLE Weight Bearing: Partial weight bearing RLE Partial Weight Bearing Percentage or Pounds: <50%      Mobility  Bed Mobility Overal bed mobility: Needs Assistance Bed Mobility: Supine to Sit     Supine to sit: Min assist     General bed mobility comments: verbal cues for technique, assist for R LE, cues for maintaining precautions  Transfers Overall transfer level: Needs assistance Equipment used: Rolling  walker (2 wheeled) Transfers: Sit to/from Stand Sit to Stand: Min guard         General transfer comment: cues for UE and LE positioning, effortful however only min/guard  Ambulation/Gait Ambulation/Gait assistance: Min Web designer (Feet): 24 Feet Assistive device: Rolling walker (2 wheeled) Gait Pattern/deviations: Step-to pattern;Decreased stance time - right;Decreased weight shift to right;Trunk flexed;Antalgic Gait velocity: decr   General Gait Details: verbal cues for sequence, RW positioning, turning toward nonsurgical leg to maintain precautions, aware of PWB status and utilizes RW throughout  [employer] Mobility    Modified Rankin (Stroke Patients Only)       Balance                                             Pertinent Vitals/Pain Pain Assessment: Faces Faces Pain Scale: Hurts a little bit Pain Location: R hip/thigh Pain Descriptors / Indicators: Sore;Grimacing;Aching Pain Intervention(s): Monitored during session;Repositioned;Premedicated before session    Home Living Family/patient expects to be discharged to:: Private residence Living Arrangements: Alone           Home Layout: One level Home Equipment: Environmental consultant - 2 wheels;Walker - 4 wheels;Cane - single point;Tub bench Additional Comments: pt stayed at son's house upon d/c last admission.  Plan is now for d/c to SNF    Prior Function Level of Independence: Independent with assistive device(s);Needs assistance         Comments: pt reports gradually  requiring more assist from family upon last d/c due to pain     Hand Dominance        Extremity/Trunk Assessment        Lower Extremity Assessment Lower Extremity Assessment: RLE deficits/detail RLE Deficits / Details: ankle WFL; knee and hip grossly 2+/5, anticipated post op hip weakness (3rd incision within the month); maintained precautions       Communication   Communication: No  difficulties  Cognition Arousal/Alertness: Awake/alert Behavior During Therapy: Anxious Overall Cognitive Status: Within Functional Limits for tasks assessed                                        General Comments      Exercises     Assessment/Plan    PT Assessment Patient needs continued PT services  PT Problem List Decreased strength;Decreased activity tolerance;Decreased mobility;Decreased knowledge of use of DME;Pain;Decreased knowledge of precautions       PT Treatment Interventions DME instruction;Gait training;Functional mobility training;Therapeutic activities;Therapeutic exercise;Patient/family education;Stair training    PT Goals (Current goals can be found in the Care Plan section)  Acute Rehab PT Goals PT Goal Formulation: With patient Time For Goal Achievement: 01/22/19 Potential to Achieve Goals: Good    Frequency 7X/week   Barriers to discharge        Co-evaluation               AM-PAC PT "6 Clicks" Mobility  Outcome Measure Help needed turning from your back to your side while in a flat bed without using bedrails?: A Little Help needed moving from lying on your back to sitting on the side of a flat bed without using bedrails?: A Little Help needed moving to and from a bed to a chair (including a wheelchair)?: A Little Help needed standing up from a chair using your arms (e.g., wheelchair or bedside chair)?: A Little Help needed to walk in hospital room?: A Little Help needed climbing 3-5 steps with a railing? : A Lot 6 Click Score: 17    End of Session Equipment Utilized During Treatment: Gait belt Activity Tolerance: Patient tolerated treatment well Patient left: in chair;with call bell/phone within reach   PT Visit Diagnosis: Difficulty in walking, not elsewhere classified (R26.2)    Time: 8338-2505 PT Time Calculation (min) (ACUTE ONLY): 26 min   Charges:   PT Evaluation $PT Re-evaluation: 1 Re-eval PT  Treatments $Gait Training: 8-22 mins      Zenovia Jarred, PT, DPT Acute Rehabilitation Services Office: [phone] Pager: [phone]  [name] 01/08/2019, 3:20 PM

## 2019-01-08 NOTE — Op Note (Signed)
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  PROCEDURE:  Revision right hip, specifically revision of the acetabular component to a size 52 mm Pinnacle shell, 2 cancellous bone screws, a 36+4 neutral Altrx liner and a 36+12 delta ceramic ball.  SURGEON:  Durene Romans, MD  ASSISTANT:  Lanney Gins, PA-C.  ANESTHESIA:  Spinal.  SPECIMENS:  None.  COMPLICATIONS:  None.  ESTIMATED BLOOD LOSS:  500 mL  DRAINS:  None.  INDICATIONS FOR PROCEDURE:  This is a 73 year old female with history well documented of an index right total hip replacement that seemingly was uneventful until she presented to the office for a 2-week visit with a medial based displaced fracture and  femoral component subsidence.  She was scheduled and had revision surgery performed on 08/25.  At that time, I revised her femoral component to an AML fully porous coated stem adding aversion to this as well, but maintaining her acetabular shell, which  was placed in the setting of an anterior hip surgery.  At the time of the surgery despite the femoral component management, I did decide to use a 32+4 face changing liner to try to improve the combined anteversion.  We used a 32+5 ball.  Despite this  attempt, she had dislocated early in the recovery room.  I felt this was more related to her spinal block anesthesia.  However, with efforts with physical therapy on examination, there was concern about her right lower extremity being short on  yesterday's rounds.  For that reason, a plain x-ray was ordered this morning revealing a dislocated right hip.  Based on  this, I felt that it was necessary to do an open reduction and exploration of her components to make certain that nothing had  shifted, but also evaluate her acetabular component for revision purposes if there were concerns of instability.  Consent was obtained after reviewing my thought process and the rationale for the surgery as opposed to a pure closed reduction.  Consent  was obtained.  FINDINGS:  Please see the body of the below paragraph for findings.  DESCRIPTION OF PROCEDURE:  The patient was brought to the operative theater.  Once adequate anesthesia, preoperative antibiotics, Ancef administered as well as tranexamic acid, she was positioned into the left lateral decubitus position with the right  hip up.  The right lower extremity was prepped and draped in sterile fashion.  Timeout was performed identifying the patient, the planned procedure and extremity.  Her old incision was excised and slightly extended.  We encountered a subcutaneous seroma,  which was evacuated down to the iliotibial band and gluteal fascia.  This fascial line was intact.  I then incised this through for posterior approach encountering a large intrasubfacial seroma.  Prior to the draping, I had reduced the hip under  anesthesia to make it easier to mobilize her hip.  At this point, the hip was found to still be located.  Following debridement and irrigation necessary for exposure purposes, I did evaluate and found that her combined anteversion at this position was  about 40 degrees.  There was no significant impingement.  There were no significant findings for loosening or rotation of the components.  Given all these findings, I felt  that I needed to alter the acetabular shell to provide more stability as well as  increase the mm size diameter of the femoral head ball.  For this reason, we removed the old femoral head ball.  I placed the trunnion over the anterior ilium and placed retractors for exposure of the  acetabulum.  The acetabular liner was removed as well  as a cancellous screw.  I then used the explant cup system and removed the acetabular shell without bone loss.  I reamed with a 51 mm reamer and then selected a 52 mm shell.  The shell was impacted with decent initial scratch fit.  I placed 2 cancellous  screws in the ilium.  The cup position now appeared to be forward flexed, probably at least 20-30 degrees.  She was noted to have a shallow acetabulum.  A portion of the cup was exposed posteriorly.  At this point, we focused on the trial reductions.  I  did a trial reduction with a 36+4 neutral as well as with a 36+4, 10-degree face-changing liner.  With each of these I trialed with a 36+8.5 ball.  With the face changing liner I was worried based on the combined anteversion that there was an  impingement with external rotation and extension that could lead to subluxation anteriorly.  For that reason, I chose a 36+4 neutral Altrx liner.  The combined anteversion with a trial reduction was least 50 degrees with no evidence of impingement with  forward flexion and internal rotation.  Given all the trial reductions and the thought processes that were going on there, we selected a 36+4 neutral Altrx liner impacted this with good visualized rim fit  based on the reasons of our revision surgery at  this point and instability as well as recognizing that her left lower extremity was shorter than her right from previous periprosthetic fracture and revision and shortening through that.  The leg length on this right side needed to be longer than the  left to provide stability.  I did not try to match it to her shortened left lower extremity.  For that reason, I selected a 36+12 ball to enhance the soft tissue tension and improve the stability of the components.  The final 36+12 ball was impacted on a  clean and dried trunnion and the hip was reduced.  The hip was irrigated throughout the case with about 2 liters  normal saline solution with pulse lavage.  At this point, we reapproximated the iliotibial band and gluteal fascia using #1 Vicryl and  Stratafix suture.  The remaining wound was closed in layers with 2-0 Vicryl and a running Monocryl.  The hip was clean, dry and dressed sterilely with surgical glue and Aquacel dressing.  She was then brought to the recovery room in stable condition.   Findings were reviewed with her son.  Postoperatively, we will have her be partial weightbearing at this point based on the acetabular revision to allow for bony ingrowth with posterior hip precautions.  We will get her mobilized tomorrow and work on disposition over the next 48-72 hours.  TN/NUANCE  D:01/07/2019 T:01/08/2019 JOB:[id number]

## 2019-01-08 NOTE — Progress Notes (Signed)
Pt continues to decline nocturnal bipap.  Pt prefers to wear nasal cannula at night.  Pt was encouraged to call should she change her mind.  RN aware.

## 2019-01-08 NOTE — Progress Notes (Signed)
Patient ID: Anne Bryant, female   DOB: [birth year], 73 y.o.   MRN: [id number] Subjective: 1 Day Post-Op Procedure(s) (LRB): TOTAL HIP REVISION (Right)    Patient reports pain as moderate.  No events over night. Reviewed intra-operative findings and procedure performed  Objective:   VITALS:   Vitals:   01/08/19 0335 01/08/19 0441  BP: 130/77 131/70  Pulse: 71 70  Resp: 18 15  Temp: 97.8 F (36.6 C) 98.2 F (36.8 C)  SpO2: 99% 98%    Neurovascular intact Incision: dressing C/D/I  LABS Recent Labs    01/07/19 1901 01/08/19 0333  HGB 8.9* 8.0*  HCT 30.4* 27.8*  WBC 8.6 8.4  PLT 241 227    Recent Labs    01/08/19 0333  NA 137  K 4.7  BUN 17  CREATININE 0.67  GLUCOSE 196*    No results for input(s): LABPT, INR in the last 72 hours.   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   Will treat this non-op with PWB for 6 weeks in addition to the planned limited weight bearing due to acetabular revision   Advance diet Up with therapy  PWB RLE due to acetabular revision and radiographic find of longitudinal split at end of femoral component  Posterior hip precautions  Discharge to SNF  Probably Thursday  Monitor Hgb - no significant drop from OR last night

## 2019-01-09 DIAGNOSIS — D62 Acute posthemorrhagic anemia: Secondary | ICD-10-CM | POA: Diagnosis not present

## 2019-01-09 LAB — GLUCOSE, CAPILLARY
Glucose-Capillary: 140 mg/dL — ABNORMAL HIGH (ref 70–99)
Glucose-Capillary: 156 mg/dL — ABNORMAL HIGH (ref 70–99)
Glucose-Capillary: 121 mg/dL — ABNORMAL HIGH (ref 70–99)
Glucose-Capillary: 174 mg/dL — ABNORMAL HIGH (ref 70–99)
Glucose-Capillary: 108 mg/dL — ABNORMAL HIGH (ref 70–99)

## 2019-01-09 LAB — CBC
HCT: 24.3 % — ABNORMAL LOW (ref 36.0–46.0)
Hemoglobin: 7.1 g/dL — ABNORMAL LOW (ref 12.0–15.0)
MCH: 22 pg — ABNORMAL LOW (ref 26.0–34.0)
MCHC: 29.2 g/dL — ABNORMAL LOW (ref 30.0–36.0)
MCV: 75.5 fL — ABNORMAL LOW (ref 80.0–100.0)
Platelets: 239 10*3/uL (ref 150–400)
RBC: 3.22 MIL/uL — ABNORMAL LOW (ref 3.87–5.11)
RDW: 18.4 % — ABNORMAL HIGH (ref 11.5–15.5)
WBC: 9.7 10*3/uL (ref 4.0–10.5)
nRBC: 0.6 % — ABNORMAL HIGH (ref 0.0–0.2)

## 2019-01-09 LAB — BASIC METABOLIC PANEL
Anion gap: 8 (ref 5–15)
BUN: 22 mg/dL (ref 8–23)
CO2: 26 mmol/L (ref 22–32)
Calcium: 8.3 mg/dL — ABNORMAL LOW (ref 8.9–10.3)
Chloride: 105 mmol/L (ref 98–111)
Creatinine, Ser: 0.84 mg/dL (ref 0.44–1.00)
GFR calc Af Amer: >60 mL/min (ref >60)
GFR calc non Af Amer: >60 mL/min (ref >60)
Glucose, Bld: 138 mg/dL — ABNORMAL HIGH (ref 70–99)
Potassium: 4.3 mmol/L (ref 3.5–5.1)
Sodium: 139 mmol/L (ref 135–145)

## 2019-01-09 MED ORDER — GABAPENTIN 300 MG PO CAPS
300.0000 mg | ORAL_CAPSULE | Freq: Three times a day (TID) | ORAL | Status: DC
  Administered 2019-01-09: 300 mg via ORAL
  Filled 2019-01-09: qty 1

## 2019-01-09 NOTE — Progress Notes (Signed)
Pt continues to decline nocturnal bipap and prefers to wear nasal cannula instead.  Pt was encouraged to call should she change her mind.

## 2019-01-09 NOTE — Progress Notes (Addendum)
     Subjective: 2 Days Post-Op Procedure(s) (LRB): TOTAL HIP REVISION (Right)   Patient reports pain as mild, pain controlled.  No reported events throughout the night.  Dr. Alvan Dame reviewed the plan moving forward.  Will review labs tomorrow and see if H&H is stable. If so, probably able to d/c to SNF tomorrow.    Objective:   VITALS:   Vitals:   01/09/19 0104 01/09/19 0512  BP: (!) 102/56 (!) 102/54  Pulse: 72 73  Resp: 16 20  Temp: 98 F (36.7 C) 98.2 F (36.8 C)  SpO2: 98% 100%    Dorsiflexion/Plantar flexion intact Incision: dressing C/D/I No cellulitis present Compartment soft  LABS Recent Labs    01/07/19 1901 01/08/19 0333 01/09/19 0223  HGB 8.9* 8.0* 7.1*  HCT 30.4* 27.8* 24.3*  WBC 8.6 8.4 9.7  PLT 241 227 239    Recent Labs    01/08/19 0333 01/09/19 0223  NA 137 139  K 4.7 4.3  BUN 17 22  CREATININE 0.67 0.84  GLUCOSE 196* 138*     Assessment/Plan: 2 Days Post-Op Procedure(s) (LRB): TOTAL HIP REVISION (Right)  Posterior hip precautions PWB 50% right leg  Up with therapy Discharge to SNF when ready, possibly tomorrow  Obese (BMI 30-39.9) Estimated body mass index is 35.38 kg/m as calculated from the following:   Height as of this encounter: 5' 1.5" (1.562 m).   Weight as of this encounter: 86.3 kg. Patient also counseled that weight may inhibit the healing process Patient counseled that losing weight will help with future health issues  ABLA  Treated with iron and will obtain labs tomorrow If it drops below 7 and she is symptomatic, may receive blood.        West Pugh Bently Wyss   PAC  01/09/2019, 8:53 AM

## 2019-01-09 NOTE — Progress Notes (Signed)
Physical Therapy Treatment Patient Details Name: Anne Bryant MRN: [id number] DOB: [birth year] Today's Date: 01/09/2019    History of Present Illness Pt is a 73 year old female s/p Right DA THA 3 weeks ago and currently s/p right posterior approach femoral hip revision 01/01/19 due to Right periprosthetic femur fracture with failed femoral component with subsidence. Pt found to have right hip dislocation during admission and s/p Right hip revision on 01/07/19.  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    PT Comments    Pt assisted with ambulated in hallway and required seated rest break due to mild dizziness (see mobility section below).  Pt reports d/c to SNF tomorrow.   Follow Up Recommendations  Follow surgeon's recommendation for DC plan and follow-up therapies     Equipment Recommendations  None recommended by PT    Recommendations for Other Services       Precautions / Restrictions Precautions Precautions: Fall;Posterior Hip Precaution Comments: reviewed posterior hip precautions when in bed and with mobilizing Restrictions Weight Bearing Restrictions: Yes RLE Weight Bearing: Partial weight bearing RLE Partial Weight Bearing Percentage or Pounds: 50%    Mobility  Bed Mobility Overal bed mobility: Needs Assistance Bed Mobility: Supine to Sit     Supine to sit: Min assist     General bed mobility comments: verbal cues for technique, assist for R LE, cues for maintaining precautions  Transfers Overall transfer level: Needs assistance Equipment used: Rolling walker (2 wheeled) Transfers: Sit to/from Stand Sit to Stand: Min assist         General transfer comment: cues for UE and LE positioning, effortful; required assist to rise and steady  Ambulation/Gait Ambulation/Gait assistance: Min assist Gait Distance (Feet): 21 Feet Assistive device: Rolling walker (2 wheeled) Gait Pattern/deviations: Step-to  pattern;Decreased stance time - right;Decreased weight shift to right;Trunk flexed;Antalgic Gait velocity: decr   General Gait Details: verbal cues for sequence, RW positioning, turning toward nonsurgical leg to maintain precautions, aware of PWB status and utilizes RW throughout; 21'x1 and then 10'x1, required seated rest break due to dizziness; BP 131/75 mmHg after second short distance and settled in Dentist    Modified Rankin (Stroke Patients Only)       Balance                                            Cognition Arousal/Alertness: Awake/alert Behavior During Therapy: Anxious Overall Cognitive Status: Within Functional Limits for tasks assessed                                        Exercises      General Comments        Pertinent Vitals/Pain Pain Assessment: Faces Faces Pain Scale: Hurts little more Pain Location: R hip/thigh Pain Descriptors / Indicators: Sore;Grimacing;Aching Pain Intervention(s): Monitored during session;Premedicated before session;Repositioned;Heat applied(heat to thigh only)    Home Living                      Prior Function            PT Goals (current goals can now be found in  the care plan section) Progress towards PT goals: Progressing toward goals    Frequency    7X/week      PT Plan Current plan remains appropriate    Co-evaluation              AM-PAC PT "6 Clicks" Mobility   Outcome Measure  Help needed turning from your back to your side while in a flat bed without using bedrails?: A Little Help needed moving from lying on your back to sitting on the side of a flat bed without using bedrails?: A Little Help needed moving to and from a bed to a chair (including a wheelchair)?: A Little Help needed standing up from a chair using your arms (e.g., wheelchair or bedside chair)?: A Little Help needed to walk in hospital  room?: A Lot Help needed climbing 3-5 steps with a railing? : A Lot 6 Click Score: 16    End of Session Equipment Utilized During Treatment: Gait belt Activity Tolerance: Patient tolerated treatment well Patient left: in chair;with call bell/phone within reach Nurse Communication: Mobility status PT Visit Diagnosis: Difficulty in walking, not elsewhere classified (R26.2)     Time: 0626-9485 PT Time Calculation (min) (ACUTE ONLY): 28 min  Charges:  $Gait Training: 23-37 mins                    Zenovia Jarred, PT, DPT Acute Rehabilitation Services Office: [phone] Pager: [phone]  [name] 01/09/2019, 4:08 PM

## 2019-01-09 NOTE — NC FL2 (Addendum)
[place] MEDICAID FL2 LEVEL OF CARE SCREENING TOOL     IDENTIFICATION  Patient Name: Anne Bryant Birthdate: [birth year] Sex: female Admission Date (Current Location): 01/01/2019  [facility] and [place] Number:  Producer, television/film/video and Address:         Provider Number: [id number]  Attending Physician Name and Address:  Durene Romans, MD  Relative Name and Phone Number:       Current Level of Care: Hospital  Recommended Level of Care: Skilled Nursing Facility Prior Approval Number:    Date Approved/Denied:   PASRR Number: [id number]  Discharge Plan: SNF    Current Diagnoses: Patient Active Problem List   Diagnosis Date Noted  . Acute blood loss anemia 01/09/2019  . Obese 01/02/2019  . S/P right TH rev 01/01/2019  . Status post right hip replacement 12/11/2018  . Chest pain 06/19/2016  . Major depressive disorder, recurrent episode, severe, without mention of psychotic behavior 02/15/2013  . GAD (generalized anxiety disorder) 02/15/2013  . DYSPNEA 06/23/2009  . CHEST PAIN-PRECORDIAL 06/23/2009  . Diabetes mellitus due to underlying condition (HCC) 06/19/2009  . HYPERLIPIDEMIA 06/19/2009  . LEUKOCYTOSIS 06/19/2009  . Anxiety state 06/19/2009  . PANIC ATTACK 06/19/2009  . DEPRESSION 06/19/2009  . MIGRAINE HEADACHE 06/19/2009  . Essential hypertension 06/19/2009  . Asthma 06/19/2009  . RHEUMATOID ARTHRITIS 06/19/2009  . OSTEOARTHRITIS 06/19/2009  . Fibromyalgia 06/19/2009  . LEG EDEMA 06/19/2009  . EPISTAXIS 06/19/2009  . DYSPNEA ON EXERTION 06/19/2009  . IMPAIRED FASTING GLUCOSE 06/19/2009    Orientation RESPIRATION BLADDER Height & Weight     Self, Time, Situation, Place  Normal Continent Weight: 190 lb 5 oz (86.3 kg) Height:  5' 1.5" (156.2 cm)  BEHAVIORAL SYMPTOMS/MOOD NEUROLOGICAL BOWEL NUTRITION STATUS      Continent Diet(Carb Modified)  AMBULATORY STATUS COMMUNICATION OF NEEDS Skin   Extensive Assist Verbally Surgical wounds-Incision  Right Hip                       Personal Care Assistance Level of Assistance  Bathing, Feeding, Dressing Bathing Assistance: Limited assistance Feeding assistance: Independent Dressing Assistance: Extensive assistance     Functional Limitations Info  Sight, Hearing, Speech Sight Info: Impaired Hearing Info: Adequate Speech Info: Adequate    SPECIAL CARE FACTORS FREQUENCY  PT (By licensed PT), OT (By licensed OT)     PT Frequency: 5x/week OT Frequency: 5x/week            Contractures Contractures Info: Not present    Additional Factors Info  Code Status, Allergies, Psychotropic, Insulin Sliding Scale Code Status Info: Fullcode Allergies Info: Allergies: Escitalopram Oxalate, Azithromycin, Clarithromycin, Doxycycline, Fluoxetine, Morphine And Related, Pregabalin, Sertraline Hcl, Tetanus Toxoid Adsorbed Psychotropic Info: Wellbutrin Insulin Sliding Scale Info: NovoLOG 0-15 units 3X'S w/ meal       Current Medications (01/09/2019):  This is the current hospital active medication list Current Facility-Administered Medications  Medication Dose Route Frequency Provider Last Rate Last Dose  . 0.9 %  sodium chloride infusion   Intravenous Continuous Lanney Gins, PA-C   Stopped at 01/03/19 7591  . 0.9 %  sodium chloride infusion   Intravenous Continuous Lanney Gins, PA-C   Stopped at 01/08/19 1410  . albuterol (PROVENTIL) (2.5 MG/3ML) 0.083% nebulizer solution 2.5 mg  2.5 mg Inhalation Q6H PRN Lanney Gins, PA-C      . alum & mag hydroxide-simeth (MAALOX/MYLANTA) 200-200-20 MG/5ML suspension 15 mL  15 mL Oral Q4H PRN Lanney Gins, PA-C      .  aspirin chewable tablet 81 mg  81 mg Oral BID Danae Orleans, PA-C   81 mg at 01/09/19 0845  . atorvastatin (LIPITOR) tablet 10 mg  10 mg Oral QHS Danae Orleans, PA-C   10 mg at 01/08/19 2202  . bisacodyl (DULCOLAX) suppository 10 mg  10 mg Rectal Daily PRN Babish, Matthew, PA-C      . buPROPion (WELLBUTRIN XL) 24 hr  tablet 150 mg  150 mg Oral Daily Danae Orleans, PA-C   150 mg at 01/09/19 0844  . celecoxib (CELEBREX) capsule 200 mg  200 mg Oral BID Danae Orleans, PA-C   200 mg at 01/09/19 0843  . cyclobenzaprine (FLEXERIL) tablet 5-10 mg  5-10 mg Oral TID PRN Danae Orleans, PA-C   10 mg at 01/09/19 1209  . diphenhydrAMINE (BENADRYL) 12.5 MG/5ML elixir 12.5-25 mg  12.5-25 mg Oral Q4H PRN Danae Orleans, PA-C      . docusate sodium (COLACE) capsule 100 mg  100 mg Oral BID Danae Orleans, PA-C   100 mg at 01/08/19 2202  . ferrous sulfate tablet 325 mg  325 mg Oral TID PC Danae Orleans, PA-C   325 mg at 01/09/19 1209  . furosemide (LASIX) tablet 20 mg  20 mg Oral Daily PRN Danae Orleans, PA-C      . gabapentin (NEURONTIN) capsule 300 mg  300 mg Oral TID Babish, Matthew, PA-C      . glipiZIDE (GLUCOTROL XL) 24 hr tablet 5 mg  5 mg Oral QAC breakfast Danae Orleans, PA-C   5 mg at 01/09/19 0843  . HYDROmorphone (DILAUDID) injection 0.5-1 mg  0.5-1 mg Intravenous Q2H PRN Danae Orleans, PA-C   1 mg at 01/03/19 2206  . insulin aspart (novoLOG) injection 0-15 Units  0-15 Units Subcutaneous TID WC Danae Orleans, PA-C   3 Units at 01/09/19 1209  . loratadine (CLARITIN) tablet 10 mg  10 mg Oral Daily Danae Orleans, PA-C   10 mg at 01/09/19 0844  . magnesium citrate solution 1 Bottle  1 Bottle Oral Once PRN Babish, Matthew, PA-C      . menthol-cetylpyridinium (CEPACOL) lozenge 3 mg  1 lozenge Oral PRN Danae Orleans, PA-C       Or  . phenol (CHLORASEPTIC) mouth spray 1 spray  1 spray Mouth/Throat PRN Babish, Matthew, PA-C      . metoCLOPramide (REGLAN) tablet 5-10 mg  5-10 mg Oral Q8H PRN Danae Orleans, PA-C       Or  . metoCLOPramide (REGLAN) injection 5-10 mg  5-10 mg Intravenous Q8H PRN Danae Orleans, PA-C      . metoprolol succinate (TOPROL-XL) 24 hr tablet 50 mg  50 mg Oral QHS Babish, Matthew, PA-C   50 mg at 01/08/19 2202  . mometasone-formoterol (DULERA) 200-5 MCG/ACT inhaler 2 puff  2 puff  Inhalation BID Danae Orleans, PA-C   2 puff at 01/09/19 0849  . ondansetron (ZOFRAN) tablet 4 mg  4 mg Oral Q6H PRN Danae Orleans, PA-C       Or  . ondansetron [facility]) injection 4 mg  4 mg Intravenous Q6H PRN Danae Orleans, PA-C      . oxyCODONE (Oxy IR/ROXICODONE) immediate release tablet 10 mg  10 mg Oral Q4H PRN Danae Orleans, PA-C   10 mg at 01/08/19 0117  . oxyCODONE (Oxy IR/ROXICODONE) immediate release tablet 15 mg  15 mg Oral Q4H PRN Danae Orleans, PA-C   15 mg at 01/09/19 1337  . pantoprazole (PROTONIX) EC tablet 40 mg  40 mg Oral BID Danae Orleans,  PA-C   40 mg at 01/09/19 0843  . polyethylene glycol (MIRALAX / GLYCOLAX) packet 17 g  17 g Oral BID Lanney Gins, PA-C   17 g at 01/05/19 2122  . predniSONE (DELTASONE) tablet 7.5 mg  7.5 mg Oral Q breakfast Lanney Gins, PA-C   7.5 mg at 01/09/19 0844  . primidone (MYSOLINE) tablet 100 mg  100 mg Oral QHS Lanney Gins, PA-C   100 mg at 01/08/19 2215  . SUMAtriptan (IMITREX) tablet 100 mg  100 mg Oral Q2H PRN Lanney Gins, PA-C         Discharge Medications: Please see discharge summary for a list of discharge medications.  Relevant Imaging Results:  Relevant Lab Results:   Additional Information ssn:[id number]  Clearance Coots, LCSW

## 2019-01-10 LAB — GLUCOSE, CAPILLARY
Glucose-Capillary: 96 mg/dL (ref 70–99)
Glucose-Capillary: 119 mg/dL — ABNORMAL HIGH (ref 70–99)
Glucose-Capillary: 149 mg/dL — ABNORMAL HIGH (ref 70–99)
Glucose-Capillary: 109 mg/dL — ABNORMAL HIGH (ref 70–99)

## 2019-01-10 LAB — CBC
HCT: 24 % — ABNORMAL LOW (ref 36.0–46.0)
Hemoglobin: 6.9 g/dL — CL (ref 12.0–15.0)
MCH: 22.3 pg — ABNORMAL LOW (ref 26.0–34.0)
MCHC: 28.8 g/dL — ABNORMAL LOW (ref 30.0–36.0)
MCV: 77.4 fL — ABNORMAL LOW (ref 80.0–100.0)
Platelets: 212 10*3/uL (ref 150–400)
RBC: 3.1 MIL/uL — ABNORMAL LOW (ref 3.87–5.11)
RDW: 18.8 % — ABNORMAL HIGH (ref 11.5–15.5)
WBC: 8 10*3/uL (ref 4.0–10.5)
nRBC: 0.8 % — ABNORMAL HIGH (ref 0.0–0.2)

## 2019-01-10 LAB — PREPARE RBC (CROSSMATCH)

## 2019-01-10 LAB — NOVEL CORONAVIRUS, NAA (HOSP ORDER, SEND-OUT TO REF LAB; TAT 18-24 HRS): SARS-CoV-2, NAA: NOT DETECTED

## 2019-01-10 MED ORDER — DICYCLOMINE HCL 10 MG PO CAPS
10.0000 mg | ORAL_CAPSULE | Freq: Four times a day (QID) | ORAL | Status: DC | PRN
  Filled 2019-01-10: qty 1

## 2019-01-10 MED ORDER — BUPIVACAINE HCL (PF) 0.25 % IJ SOLN
INTRAMUSCULAR | Status: AC
  Filled 2019-01-10: qty 30

## 2019-01-10 MED ORDER — SODIUM CHLORIDE 0.9% IV SOLUTION: Freq: Once | INTRAVENOUS | Status: DC

## 2019-01-10 NOTE — Care Management Important Message (Signed)
Important Message  Patient Details IM Letter given to Velva Harman RN to present to the Patient Name: Anne Bryant MRN: [id number] Date of Birth: 1946-05-03   Medicare Important Message Given:  Yes     Kerin Salen 01/10/2019, 12:12 PM

## 2019-01-10 NOTE — Progress Notes (Signed)
PT Cancellation Note  Patient Details Name: Anne Bryant MRN: [id number] DOB: [birth year]   Cancelled Treatment:    Reason Eval/Treat Not Completed: Other (comment) Pt to receive 2 units PRBCs and then likely d/c to SNF.  Will check back as schedule permits.   Ciria Bernardini,KATHrine E 01/10/2019, 8:54 AM Carmelia Bake, PT, DPT Acute Rehabilitation Services Office: [phone] Pager: [phone]

## 2019-01-10 NOTE — Discharge Summary (Addendum)
Physician Discharge Summary  Patient ID: Anne Bryant MRN: [id number] DOB/AGE: [birth year] 72 y.o.  Admit date: 01/01/2019 Discharge date:  01/11/2019    Procedures:  Procedure(s) (LRB): TOTAL HIP REVISION (Right)  Attending Physician:  Anne Bryant   Admission Diagnoses:   Right hip periprosthetic fracture  Discharge Diagnoses:  Principal Problem:   S/P right TH rev Active Problems:   Obese   Acute blood loss anemia  Past Medical History:  Diagnosis Date  . Arthritis   . Asthma   . Barrett's esophagus    PERSONAL HISTORY PER PATIENT   . Complication of anesthesia   . Diabetes mellitus without complication (HCC)    TYPE 2 , MONITORS CBGS AT HOME ; 12-31-2018  i dstopped checking it now , denies 3ps   . Diverticulitis   . Dyspnea    pulmonogist [place]man   . Dysrhythmia   . Enlarged tonsils   . Fibromyalgia 2006  . GERD (gastroesophageal reflux disease)   . History of blood transfusion   . Hypertension   . Migraine   . Narrowing of airway    per patient report ; 12-31-2018 " i had a spina"   . Panic attack    per patient report  . Pneumonia 07/2018  . Renal insufficiency    per patient report, acute kidney injury when she had pneumonia march 2020 , reports back normal   . Sleep apnea    OSA WITH BIPAP     HPI:    Anne Bryant, 73 y.o. female female, has a history of pain and functional disability in the right hip 2 weeks after her index THA.  She denies any trauma.  X-rays after the index surgery look good, but upon return to the clinic x-rays were obtained which reveal the periprosthetic fracture.  Anne Bryant discussed options with the patient and it was decided to return to the OR to revise the femoral component and probably using cable to reduce the fracture.  Onset of symptoms have been since surgery and the patient though they were associated with muscle spasms. Prior procedures on the right hip include arthroplasty per Anne Bryant on 12/11/2018.   Patient  currently rates pain in the right hip at 10 out of 10 with activity.  There is night pain, worsening of pain with activity and weight bearing, trendelenberg gait, pain that interfers with activities of daily living and pain with passive range of motion. Patient has evidence of periprosthetic fracture by imaging studies.  This condition presents safety issues increasing the risk of falls.   There is no current active infection.  Risks, benefits and expectations were discussed with the patient.  Risks including but not limited to the risk of anesthesia, blood clots, nerve damage, blood vessel damage, failure of the prosthesis, infection and up to and including death.  Patient understand the risks, benefits and expectations and wishes to proceed with surgery.   PCP: [place], Julie M, NP   Discharged Condition: good  Hospital Course:  Patient underwent the above stated procedure on 01/01/2019. Patient tolerated the procedure well and brought to the recovery room in good condition and subsequently to the floor.  Patient had uneventful course, until it was noted that she her leg was shorter and x-ray was obtained.  X-rays revealed that she had a dislocation while still in the hospital.  Patient was brought to the OR later that night for revision of the acetabular component/open reduction.  Postop x-rays revealed a periprosthetic fracture  of the distal portion of the prosthesis.  Anne Bryant is reviews results and has made the patient 50% weightbearing to allow this to heal.  Patient's hemoglobin trended down to 6.9 and due to having symptoms was given 2 units of blood.  Patient was felt to be doing well enough to be discharged to facility.      Discharge Exam: General appearance: alert, cooperative and no distress Extremities: Homans sign is negative, no sign of DVT, no edema, redness or tenderness in the calves or thighs and no ulcers, gangrene or trophic changes  Disposition:  Skilled nursing facility with  follow up in 2 weeks    Contact information for follow-up providers    Anne Bryant. Schedule an appointment as soon as possible for a visit in 2 weeks.   Specialty: Orthopedic Surgery Contact information: [address] [place] [address] [phone]            Contact information for after-discharge care    [facility] .   Service: Skilled Nursing Contact information: [address] [place] [phone]                  Discharge Instructions    Call Bryant / Call 911   Complete by: As directed    If you experience chest pain or shortness of breath, CALL 911 and be transported to the hospital emergency room.  If you develope a fever above 101 F, pus (white drainage) or increased drainage or redness at the wound, or calf pain, call your surgeon's office.   Change dressing   Complete by: As directed    Maintain surgical dressing until follow up in the clinic. If the edges start to pull up, may reinforce with tape. If the dressing is no longer working, may remove and cover with gauze and tape, but must keep the area dry and clean.  Call with any questions or concerns.   Constipation Prevention   Complete by: As directed    Drink plenty of fluids.  Prune juice may be helpful.  You may use a stool softener, such as Colace (over the counter) 100 mg twice a day.  Use MiraLax (over the counter) for constipation as needed.   Diet - low sodium heart healthy   Complete by: As directed    Discharge instructions   Complete by: As directed    Maintain surgical dressing until follow up in the clinic. If the edges start to pull up, may reinforce with tape. If the dressing is no longer working, may remove and cover with gauze and tape, but must keep the area dry and clean.  Follow up in 2 weeks at [facility]. Call with any questions or concerns.   Follow the hip precautions as  taught in Physical Therapy   Complete by: As directed    Partial weight bearing   Complete by: As directed    % Body Weight: 50   Laterality: right   Extremity: Lower   TED hose   Complete by: As directed    Use stockings (TED hose) for 2 weeks on both leg(s).  You may remove them at night for sleeping.      Allergies as of 01/11/2019      Reactions   Escitalopram Oxalate Other (See Comments)   Confusion    Azithromycin Rash, Other (See Comments)   makes pt feel funny   Clarithromycin Nausea  And Vomiting   Doxycycline Diarrhea, Other (See Comments)   Stomach issues   Fluoxetine Other (See Comments)   Irritability, anger   Morphine And Related Other (See Comments)   headaches   Pregabalin Other (See Comments)   dizzy, weakness   Sertraline Hcl Other (See Comments)   DIDN'T WORK FOR PATIENT   Tetanus Toxoid Adsorbed Other (See Comments)      Medication List    STOP taking these medications   methocarbamol 500 MG tablet Commonly known as: Robaxin   oxyCODONE-acetaminophen 10-325 MG tablet Commonly known as: PERCOCET     TAKE these medications   aspirin 81 MG chewable tablet Commonly known as: Aspirin Childrens Chew 1 tablet (81 mg total) by mouth 2 (two) times daily. Take for 4 weeks, then resume regular dose. What changed: Another medication with the same name was removed. Continue taking this medication, and follow the directions you see here.   atorvastatin 10 MG tablet Commonly known as: LIPITOR Take 10 mg by mouth at bedtime.   budesonide-formoterol 160-4.5 MCG/ACT inhaler Commonly known as: SYMBICORT Inhale 2 puffs into the lungs 2 (two) times daily.   buPROPion 150 MG 24 hr tablet Commonly known as: WELLBUTRIN XL Take 150 mg by mouth daily.   cetirizine 10 MG tablet Commonly known as: ZYRTEC Take 10 mg by mouth at bedtime.   cyclobenzaprine 10 MG tablet Commonly known as: FLEXERIL Take 1 tablet (10 mg total) by mouth 3 (three) times daily as needed  for muscle spasms.   dicyclomine 10 MG capsule Commonly known as: BENTYL Take 10 mg by mouth 4 (four) times daily.   docusate sodium 100 MG capsule Commonly known as: Colace Take 1 capsule (100 mg total) by mouth 2 (two) times daily.   ferrous sulfate 325 (65 FE) MG tablet Commonly known as: FerrouSul Take 1 tablet (325 mg total) by mouth 3 (three) times daily with meals for 14 days.   furosemide 20 MG tablet Commonly known as: LASIX Take 20 mg by mouth daily as needed for fluid or edema.   glipiZIDE XL 5 MG 24 hr tablet Generic drug: glipiZIDE Take 5 mg by mouth daily before breakfast.   metoprolol succinate 50 MG 24 hr tablet Commonly known as: TOPROL-XL Take 50 mg by mouth at bedtime.   oxyCODONE 5 MG immediate release tablet Commonly known as: Oxy IR/ROXICODONE Take 1-3 tablets (5-15 mg total) by mouth every 4 (four) hours as needed for moderate pain or severe pain. What changed: how much to take   pantoprazole 40 MG tablet Commonly known as: PROTONIX Take 40 mg by mouth 2 (two) times daily.   polyethylene glycol 17 g packet Commonly known as: MIRALAX / GLYCOLAX Take 17 g by mouth 2 (two) times daily.   predniSONE 5 MG tablet Commonly known as: DELTASONE Take 7.5 mg by mouth daily with breakfast.   primidone 50 MG tablet Commonly known as: MYSOLINE Take 100 mg by mouth at bedtime.   promethazine 25 MG tablet Commonly known as: PHENERGAN Take 25 mg by mouth daily as needed for nausea/vomiting.   SUMAtriptan 100 MG tablet Commonly known as: IMITREX Take 100 mg by mouth every 2 (two) hours as needed for migraine. May repeat in 2 hours if headache persists or recurs.   Ventolin HFA 108 (90 Base) MCG/ACT inhaler Generic drug: albuterol Inhale 2 puffs into the lungs every 6 (six) hours as needed for shortness of breath.   Vitamin D3 125 MCG (5000 UT) Caps Take  5,000 Units by mouth daily.            Discharge Care Instructions  (From admission, onward)          Start     Ordered   01/10/19 0000  Change dressing    Comments: Maintain surgical dressing until follow up in the clinic. If the edges start to pull up, may reinforce with tape. If the dressing is no longer working, may remove and cover with gauze and tape, but must keep the area dry and clean.  Call with any questions or concerns.   01/10/19 0859   01/10/19 0000  Partial weight bearing    Question Answer Comment  % Body Weight 50   Laterality right   Extremity Lower      01/10/19 0859           Signed: Anastasio Auerbach. Azaya Goedde   PA-C  01/11/2019, 8:47 AM

## 2019-01-10 NOTE — Progress Notes (Signed)
CRITICAL VALUE ALERT  Critical Value:  Hgb: 6.9  Date & Time Notied: 01/10/2019 @ 0425  Provider Notified: Jenetta Loges, PA-C   Orders Received/Actions taken: orders rec'd to crossmatch and transfuse 2 units of PRBCs.

## 2019-01-10 NOTE — Progress Notes (Signed)
     Subjective: 3 Days Post-Op Procedure(s) (LRB): TOTAL HIP REVISION (Right)   Patient reports pain as mild, pain controlled with medication.  No reported events throughout the night.  Patient did state however when she did get up she did feel some weakness/dizziness.  We have discussed giving her 2 units of blood prior to DC to facility as she has been trending downward lately with symptoms.  As long she does well with receiving blood she will be able to be discharged to the facility today.  Patient follow-up in the office in 2 weeks.  Patient knows to call with any questions or concerns.   Objective:   VITALS:   Vitals:   01/10/19 0609 01/10/19 0838  BP: 109/73   Pulse: 77   Resp: 18   Temp: 98.1 F (36.7 C)   SpO2: 100% 98%    Dorsiflexion/Plantar flexion intact Incision: dressing C/D/I No cellulitis present Compartment soft  LABS Recent Labs    01/08/19 0333 01/09/19 0223 01/10/19 0227  HGB 8.0* 7.1* 6.9*  HCT 27.8* 24.3* 24.0*  WBC 8.4 9.7 8.0  PLT 227 239 212    Recent Labs    01/08/19 0333 01/09/19 0223  NA 137 139  K 4.7 4.3  BUN 17 22  CREATININE 0.67 0.84  GLUCOSE 196* 138*     Assessment/Plan: 3 Days Post-Op Procedure(s) (LRB): TOTAL HIP REVISION (Right) To receive 2 units of blood PWB 50% right LE Up with therapy Discharge to SNF  Follow up in 2 weeks at [facility] Follow up with OLIN,Aniket Paye D in 2 weeks.  Contact information:  [facility] [address] [place] [address] [phone]    ABLA  Treated with 2 units of blood as well as iron 3 times daily   Obese (BMI 30-39.9) Estimated body mass index is 35.38 kg/m as calculated from the following:   Height as of this encounter: 5' 1.5" (1.562 m).   Weight as of this encounter: 86.3 kg. Patient also counseled that weight may inhibit the healing process Patient counseled that losing weight will help with future health issues       [place].  Kaysi Ourada   PAC  01/10/2019, 8:45 AM

## 2019-01-10 NOTE — TOC Progression Note (Addendum)
Transition of Care [facility]) - Progression Note    Patient Details  Name: Anne Bryant MRN: [id number] Date of Birth: August 20, 1945  Transition of Care [facility]) CM/SW [place], [place] Phone Number: 01/10/2019, 3:57 PM  Clinical Narrative:   COVID-19 test still pending.  Ship broker received at [facility].   CSW informed the patient at bedside.      Barriers to Discharge: COVID-19 test pending.   Expected Discharge Plan and Services           Expected Discharge Date: 01/10/19                                     Social Determinants of Health (SDOH) Interventions    Readmission Risk Interventions No flowsheet data found.

## 2019-01-10 NOTE — Anesthesia Postprocedure Evaluation (Signed)
Anesthesia Post Note  Patient: Anne Bryant  Procedure(s) Performed: TOTAL HIP REVISION (Right Hip)     Patient location during evaluation: PACU Anesthesia Type: Spinal and MAC Level of consciousness: oriented and awake and alert Pain management: pain level controlled Vital Signs Assessment: post-procedure vital signs reviewed and stable Respiratory status: spontaneous breathing, respiratory function stable and patient connected to nasal cannula oxygen Cardiovascular status: blood pressure returned to baseline and stable Postop Assessment: no headache, no backache and no apparent nausea or vomiting Anesthetic complications: no    Last Vitals:  Vitals:   01/10/19 1539 01/10/19 1617  BP: (!) 116/96 132/74  Pulse: 78 66  Resp:  14  Temp: 36.9 C 37.1 C  SpO2: 95% 97%    Last Pain:  Vitals:   01/10/19 1727  TempSrc:   PainSc: 7    Pain Goal: Patients Stated Pain Goal: 3 (01/10/19 0756)                 [place]

## 2019-01-10 NOTE — Progress Notes (Signed)
Pt continues to refuse BiPAP qhs and instead prefers to wear nasal cannula QHS.  Pt encouraged to contact RT should she change her mind.

## 2019-01-10 NOTE — Plan of Care (Signed)
Plan of care reviewed and discussed with the patient. 

## 2019-01-11 LAB — TYPE AND SCREEN
ABO/RH(D): A POS
Antibody Screen: NEGATIVE
Unit division: 0
Unit division: 0

## 2019-01-11 LAB — CBC
HCT: 33.2 % — ABNORMAL LOW (ref 36.0–46.0)
Hemoglobin: 10.1 g/dL — ABNORMAL LOW (ref 12.0–15.0)
MCH: 24 pg — ABNORMAL LOW (ref 26.0–34.0)
MCHC: 30.4 g/dL (ref 30.0–36.0)
MCV: 79 fL — ABNORMAL LOW (ref 80.0–100.0)
Platelets: 210 10*3/uL (ref 150–400)
RBC: 4.2 MIL/uL (ref 3.87–5.11)
RDW: 19.1 % — ABNORMAL HIGH (ref 11.5–15.5)
WBC: 8.4 10*3/uL (ref 4.0–10.5)
nRBC: 1.2 % — ABNORMAL HIGH (ref 0.0–0.2)

## 2019-01-11 LAB — BPAM RBC
Blood Product Expiration Date: 202009262359
Blood Product Expiration Date: 202009262359
ISSUE DATE / TIME: 202009031241
ISSUE DATE / TIME: 202009031547
Unit Type and Rh: 6200
Unit Type and Rh: 6200

## 2019-01-11 LAB — GLUCOSE, CAPILLARY
Glucose-Capillary: 126 mg/dL — ABNORMAL HIGH (ref 70–99)
Glucose-Capillary: 98 mg/dL (ref 70–99)

## 2019-01-11 NOTE — Progress Notes (Signed)
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 Pt found to have right hip dislocation during admission and s/p Right hip revision on 01/07/19.  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    PT Comments    Pt assisted up from chair to walk short distance and transfer to bed in preparation for ambulance transport.   Follow Up Recommendations  Follow surgeon's recommendation for DC plan and follow-up therapies     Equipment Recommendations  None recommended by PT    Recommendations for Other Services       Precautions / Restrictions Precautions Precautions: Fall;Posterior Hip Precaution Comments: reviewed posterior hip precautions when in bed and with mobilizing Restrictions Weight Bearing Restrictions: Yes RLE Weight Bearing: Partial weight bearing RLE Partial Weight Bearing Percentage or Pounds: 50    Mobility  Bed Mobility Overal bed mobility: Needs Assistance Bed Mobility: Sit to Supine     Supine to sit: Min assist Sit to supine: Min assist   General bed mobility comments: verbal cues for technique, assist for R LE, cues for maintaining precautions  Transfers Overall transfer level: Needs assistance Equipment used: Rolling walker (2 wheeled) Transfers: Sit to/from Stand Sit to Stand: Min assist         General transfer comment: cues for UE and LE positioning, effortful; required assist to rise and steady  Ambulation/Gait Ambulation/Gait assistance: Min assist Gait Distance (Feet): 6 Feet Assistive device: Rolling walker (2 wheeled) Gait Pattern/deviations: Step-to pattern;Decreased stance time -  right;Decreased weight shift to right;Trunk flexed;Antalgic Gait velocity: decr   General Gait Details: cues for posture, position from RW, PWB, sequence    Stairs             Wheelchair Mobility    Modified Rankin (Stroke Patients Only)       Balance Overall balance assessment: Needs assistance;History of Falls Sitting-balance support: No upper extremity supported;Feet supported Sitting balance-Leahy Scale: Good       Standing balance-Leahy Scale: Poor Standing balance comment: reliant on UEs                            Cognition Arousal/Alertness: Awake/alert Behavior During Therapy: Anxious Overall Cognitive Status: Within Functional Limits for tasks assessed                                 General Comments: Continues to be anxious, but calms down with reassurance that she will be able to walk again.      Exercises Total Joint Exercises Ankle Circles/Pumps: AROM;Both;15 reps;Supine Quad Sets: AROM;Both;10 reps Heel Slides: AAROM;Right;20 reps;Supine Hip ABduction/ADduction: Right;AAROM;15 reps;Supine    General Comments        Pertinent Vitals/Pain Pain Assessment: Faces Faces Pain Scale: Hurts even more Pain Location: R hip/thigh Pain Descriptors / Indicators: Sore;Grimacing;Aching Pain Intervention(s): Limited activity within patient's tolerance;Monitored during session;Patient requesting pain meds-RN notified;Ice applied    Home Living                      Prior Function  PT Goals (current goals can now be found in the care plan section) Acute Rehab PT Goals Patient Stated Goal: have right hip/thigh pain go away PT Goal Formulation: With patient Time For Goal Achievement: 01/22/19 Potential to Achieve Goals: Good Progress towards PT goals: Progressing toward goals    Frequency    7X/week      PT Plan Current plan remains appropriate    Co-evaluation              AM-PAC PT "6  Clicks" Mobility   Outcome Measure  Help needed turning from your back to your side while in a flat bed without using bedrails?: A Little Help needed moving from lying on your back to sitting on the side of a flat bed without using bedrails?: A Little Help needed moving to and from a bed to a chair (including a wheelchair)?: A Little Help needed standing up from a chair using your arms (e.g., wheelchair or bedside chair)?: A Little Help needed to walk in hospital room?: A Lot Help needed climbing 3-5 steps with a railing? : A Lot 6 Click Score: 16    End of Session Equipment Utilized During Treatment: Gait belt Activity Tolerance: Patient tolerated treatment well Patient left: in bed;with call bell/phone within reach;with bed alarm set Nurse Communication: Mobility status PT Visit Diagnosis: Difficulty in walking, not elsewhere classified (R26.2)     Time: 6222-9798 PT Time Calculation (min) (ACUTE ONLY): 12 min  Charges:  $Gait Training: 8-22 mins $Therapeutic Exercise: 8-22 mins                     Debe Coder PT Acute Rehabilitation Services Pager [phone] Office [phone]    Winferd Wease 01/11/2019, 3:08 PM

## 2019-01-11 NOTE — Plan of Care (Signed)
Pt ready for DC to SNF today

## 2019-01-11 NOTE — Progress Notes (Signed)
     Subjective: 4 Days Post-Op Procedure(s) (LRB): TOTAL HIP REVISION (Right)   Patient reports pain as mild, pain controlled. No events throughout the night.  Covid test has finally came back negative and doing better after receiving 2 units of blood.  Ready to be discharged to SNF.   Objective:   VITALS:   Vitals:   01/10/19 2218 01/11/19 0559  BP:  110/70  Pulse:  63  Resp:  15  Temp:  97.8 F (36.6 C)  SpO2: 97% 100%    Dorsiflexion/Plantar flexion intact Incision: dressing C/D/I No cellulitis present Compartment soft  LABS Recent Labs    01/09/19 0223 01/10/19 0227 01/11/19 0222  HGB 7.1* 6.9* 10.1*  HCT 24.3* 24.0* 33.2*  WBC 9.7 8.0 8.4  PLT 239 212 210    Recent Labs    01/09/19 0223  NA 139  K 4.3  BUN 22  CREATININE 0.84  GLUCOSE 138*     Assessment/Plan: 4 Days Post-Op Procedure(s) (LRB): TOTAL HIP REVISION (Right) Posterior hip precautions 50% WB right LE Up with therapy Discharge to SNF Follow up in 2 weeks at [facility] Follow up with OLIN,Raine Elsass D in 2 weeks.  Contact information:  [facility]) [address] [place]. Anne Bryant   PAC  01/11/2019, 8:38 AM

## 2019-01-11 NOTE — Progress Notes (Signed)
Physical Therapy Treatment Patient Details Name: Anne Bryant MRN: [id number] DOB: 07-27-1945 Today's Date: 01/11/2019    History of Present Illness Pt is a 73 year old female s/p Right DA THA 3 weeks ago and currently s/p right posterior approach femoral hip revision 01/01/19 due to Right periprosthetic femur fracture with failed femoral component with subsidence. Pt found to have right hip dislocation during admission and s/p Right hip revision on 01/07/19.  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    PT Comments    Pt motivated to mobilize and with noted improvement in all tasks but pt continues to require assist throughout session and would benefit from follow up rehab at SNF level to maximize IND and safety prior to return home ALONE.   Follow Up Recommendations  Follow surgeon's recommendation for DC plan and follow-up therapies     Equipment Recommendations  None recommended by PT    Recommendations for Other Services       Precautions / Restrictions Precautions Precautions: Fall;Posterior Hip Precaution Comments: reviewed posterior hip precautions when in bed and with mobilizing Restrictions Weight Bearing Restrictions: Yes RLE Weight Bearing: Partial weight bearing RLE Partial Weight Bearing Percentage or Pounds: 50    Mobility  Bed Mobility Overal bed mobility: Needs Assistance Bed Mobility: Supine to Sit     Supine to sit: Min assist     General bed mobility comments: verbal cues for technique, assist for R LE, cues for maintaining precautions  Transfers Overall transfer level: Needs assistance Equipment used: Rolling walker (2 wheeled) Transfers: Sit to/from Stand Sit to Stand: Min assist         General transfer comment: cues for UE and LE positioning, effortful; required assist to rise and steady  Ambulation/Gait Ambulation/Gait assistance: Min assist Gait Distance (Feet): 35 Feet Assistive  device: Rolling walker (2 wheeled) Gait Pattern/deviations: Step-to pattern;Decreased stance time - right;Decreased weight shift to right;Trunk flexed;Antalgic Gait velocity: decr   General Gait Details: cues for posture, position from RW, PWB, sequence    Stairs             Wheelchair Mobility    Modified Rankin (Stroke Patients Only)       Balance Overall balance assessment: Needs assistance;History of Falls Sitting-balance support: No upper extremity supported;Feet supported Sitting balance-Leahy Scale: Good       Standing balance-Leahy Scale: Poor Standing balance comment: reliant on UEs                            Cognition Arousal/Alertness: Awake/alert Behavior During Therapy: Anxious Overall Cognitive Status: Within Functional Limits for tasks assessed                                 General Comments: Continues to be anxious, but calms down with reassurance that she will be able to walk again.      Exercises Total Joint Exercises Ankle Circles/Pumps: AROM;Both;15 reps;Supine Quad Sets: AROM;Both;10 reps Heel Slides: AAROM;Right;20 reps;Supine Hip ABduction/ADduction: Right;AAROM;15 reps;Supine    General Comments        Pertinent Vitals/Pain Pain Assessment: Faces Faces Pain Scale: Hurts little more Pain Location: R hip/thigh Pain Descriptors / Indicators: Sore;Grimacing;Aching Pain Intervention(s): Limited activity within patient's tolerance;Monitored during session;Premedicated before session;Ice applied    Home Living  Prior Function            PT Goals (current goals can now be found in the care plan section) Acute Rehab PT Goals Patient Stated Goal: have right hip/thigh pain go away PT Goal Formulation: With patient Time For Goal Achievement: 01/22/19 Potential to Achieve Goals: Good Progress towards PT goals: Progressing toward goals    Frequency    7X/week      PT  Plan Current plan remains appropriate    Co-evaluation              AM-PAC PT "6 Clicks" Mobility   Outcome Measure  Help needed turning from your back to your side while in a flat bed without using bedrails?: A Little Help needed moving from lying on your back to sitting on the side of a flat bed without using bedrails?: A Little Help needed moving to and from a bed to a chair (including a wheelchair)?: A Little Help needed standing up from a chair using your arms (e.g., wheelchair or bedside chair)?: A Little Help needed to walk in hospital room?: A Lot Help needed climbing 3-5 steps with a railing? : A Lot 6 Click Score: 16    End of Session Equipment Utilized During Treatment: Gait belt Activity Tolerance: Patient tolerated treatment well Patient left: in chair;with call bell/phone within reach Nurse Communication: Mobility status PT Visit Diagnosis: Difficulty in walking, not elsewhere classified (R26.2)     Time: 6568-1275 PT Time Calculation (min) (ACUTE ONLY): 33 min  Charges:  $Gait Training: 8-22 mins $Therapeutic Exercise: 8-22 mins                     [place] Pager [phone] Office [phone]    Wylee Ogden 01/11/2019, 12:12 PM

## 2019-01-11 NOTE — TOC Transition Note (Addendum)
Transition of Care [facility]) - CM/SW Discharge Note   Patient Details  Name: Anne Bryant MRN: [id number] Date of Birth: May 05, 1946  Transition of Care [facility]) CM/SW Contact:  Lia Hopping, [place] Phone Number: 01/11/2019, 8:24 AM   Clinical Narrative:    COVID-19 test results fax to SNF.  Patient can discharge to [facility] SNF today.  Patient to transport by PTAR PTAR arranged to transport.  Nurse given number to call report [phone] Room:408   Final next level of care: Skilled Nursing Facility Barriers to Discharge: No Barriers Identified   Patient Goals and CMS Choice Patient states their goals for this hospitalization and ongoing recovery are:: to go home   Choice offered to / list presented to : Patient  Discharge Placement   Existing PASRR number confirmed : 01/09/19          Patient chooses bed at: Other - please specify in the comment section below:([facility]) Patient to be transferred to facility by: [facility] Name of family member notified: Patient to notify son Patient and family notified of of transfer: 01/11/19  Discharge Plan and Services                                     Social Determinants of Health (SDOH) Interventions     Readmission Risk Interventions No flowsheet data found.

## 2021-02-20 IMAGING — RF DG C-ARM 1-60 MIN-NO REPORT
1 series · 2 of 2 positions shown · non-contrast
Comparison: [HOSPITAL] KUB 02/09/2011.

CLINICAL DATA: 72-year-old female undergoing hip arthroplasty.

EXAM:
OPERATIVE RIGHT HIP (WITH PELVIS IF PERFORMED) 2 VIEWS
TECHNIQUE: Fluoroscopic spot image(s) were submitted for interpretation
post-operatively.

[Series 1: unknown protocol · 0.20mm/px · 2 of 2 slices shown]
[im 1/2]
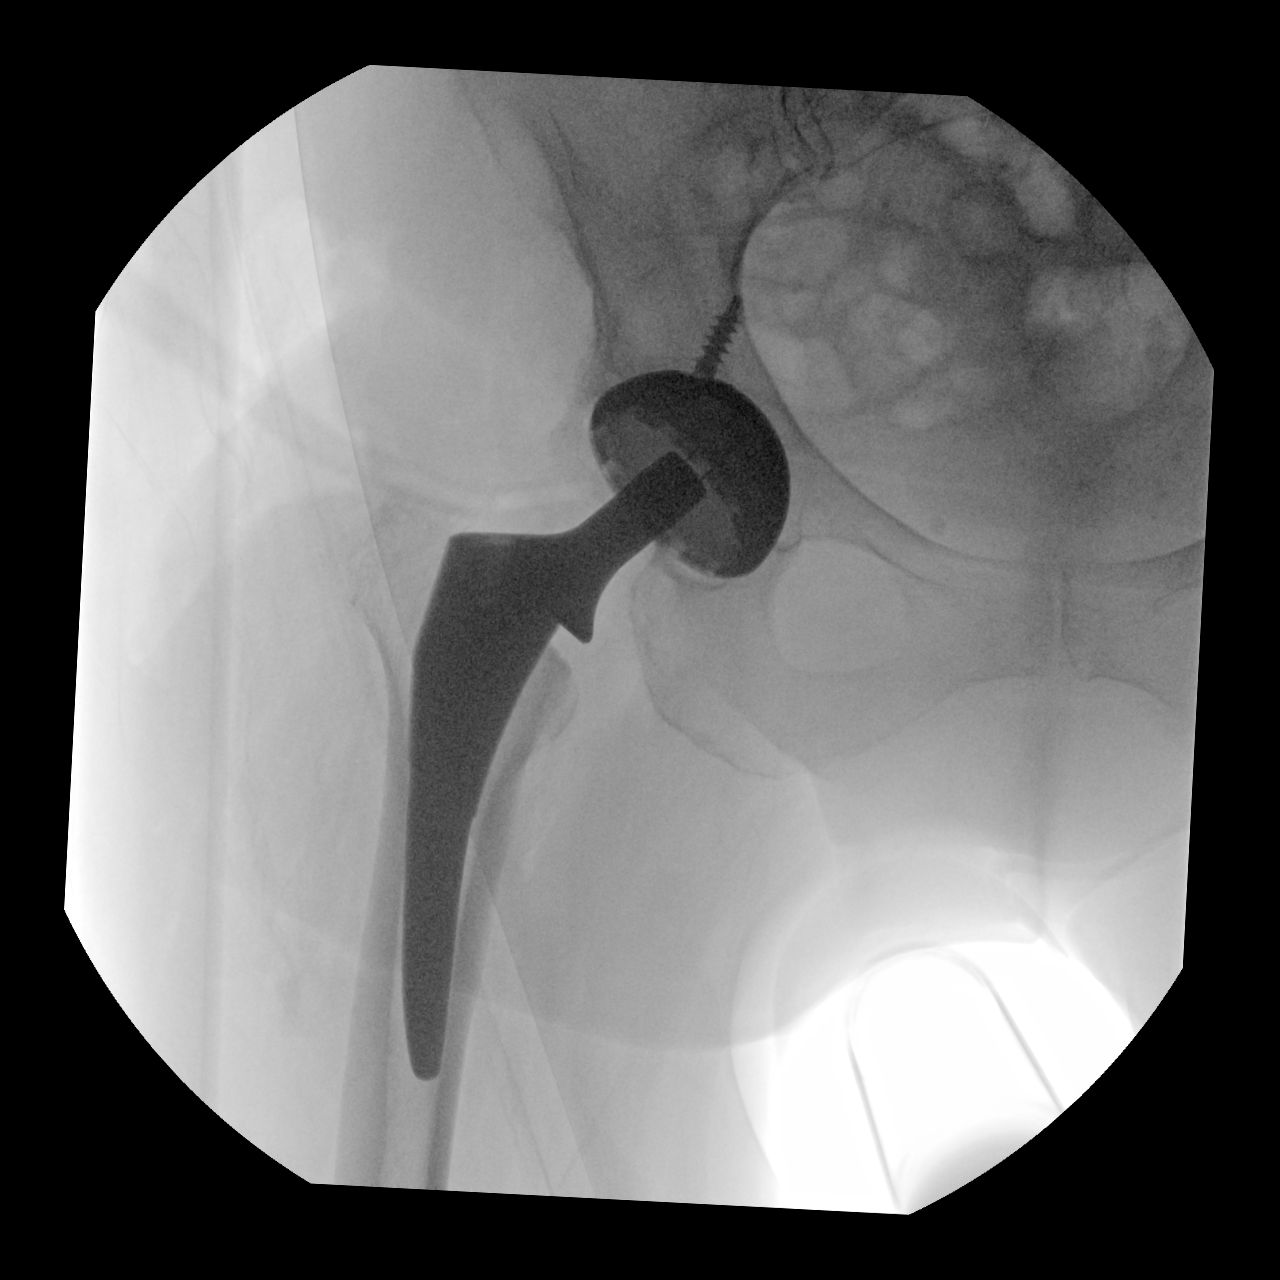
[im 2/2]
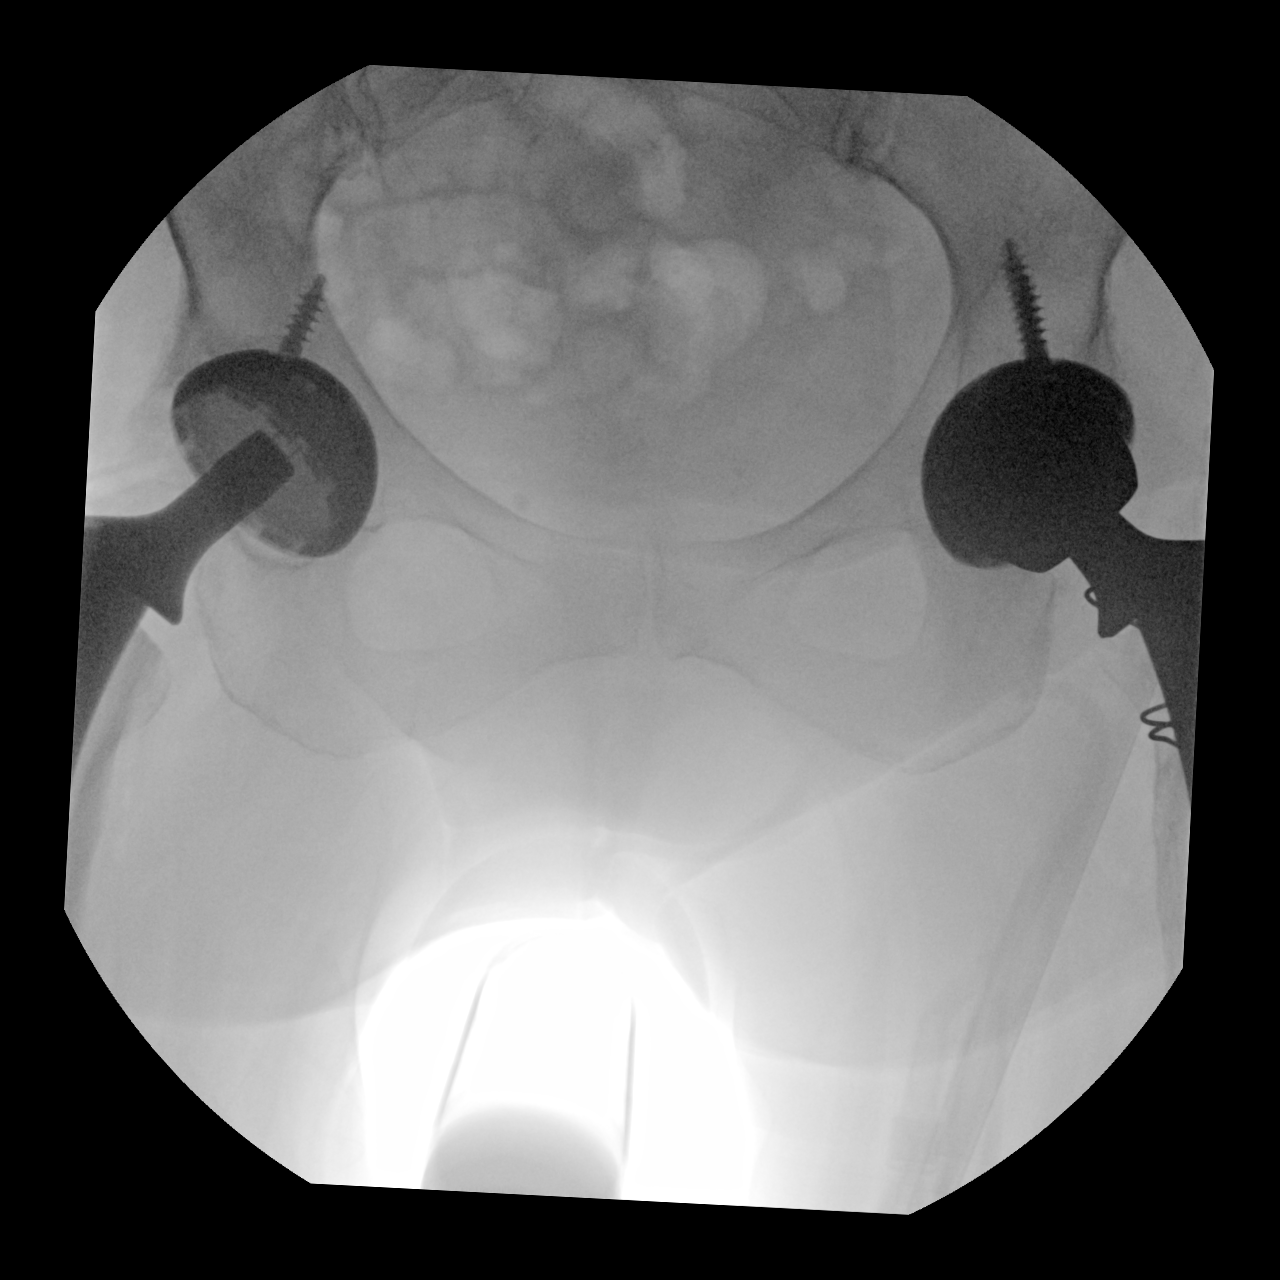

[2 of 2 positions shown; findings below may reference images not displayed]

FINDINGS: 2 intraoperative fluoroscopic spot views at 3531 hours. Preexisting
left total hip arthroplasty. Right total hip arthroplasty underway,
with normal alignment in the AP plane. No unexpected osseous
changes.

FLUOROSCOPY TIME:  0 minutes 9 seconds
IMPRESSION: Right total hip arthroplasty with no adverse features.

## 2021-02-20 IMAGING — DX PORTABLE PELVIS 1-2 VIEWS
1 series · 1 of 1 positions shown · non-contrast
Comparison: CT Abdomen and Pelvis 06/19/2016.

CLINICAL DATA: 72-year-old female status post right hip
replacement.

EXAM:
PORTABLE PELVIS 1-2 VIEWS

[pelvis ap]
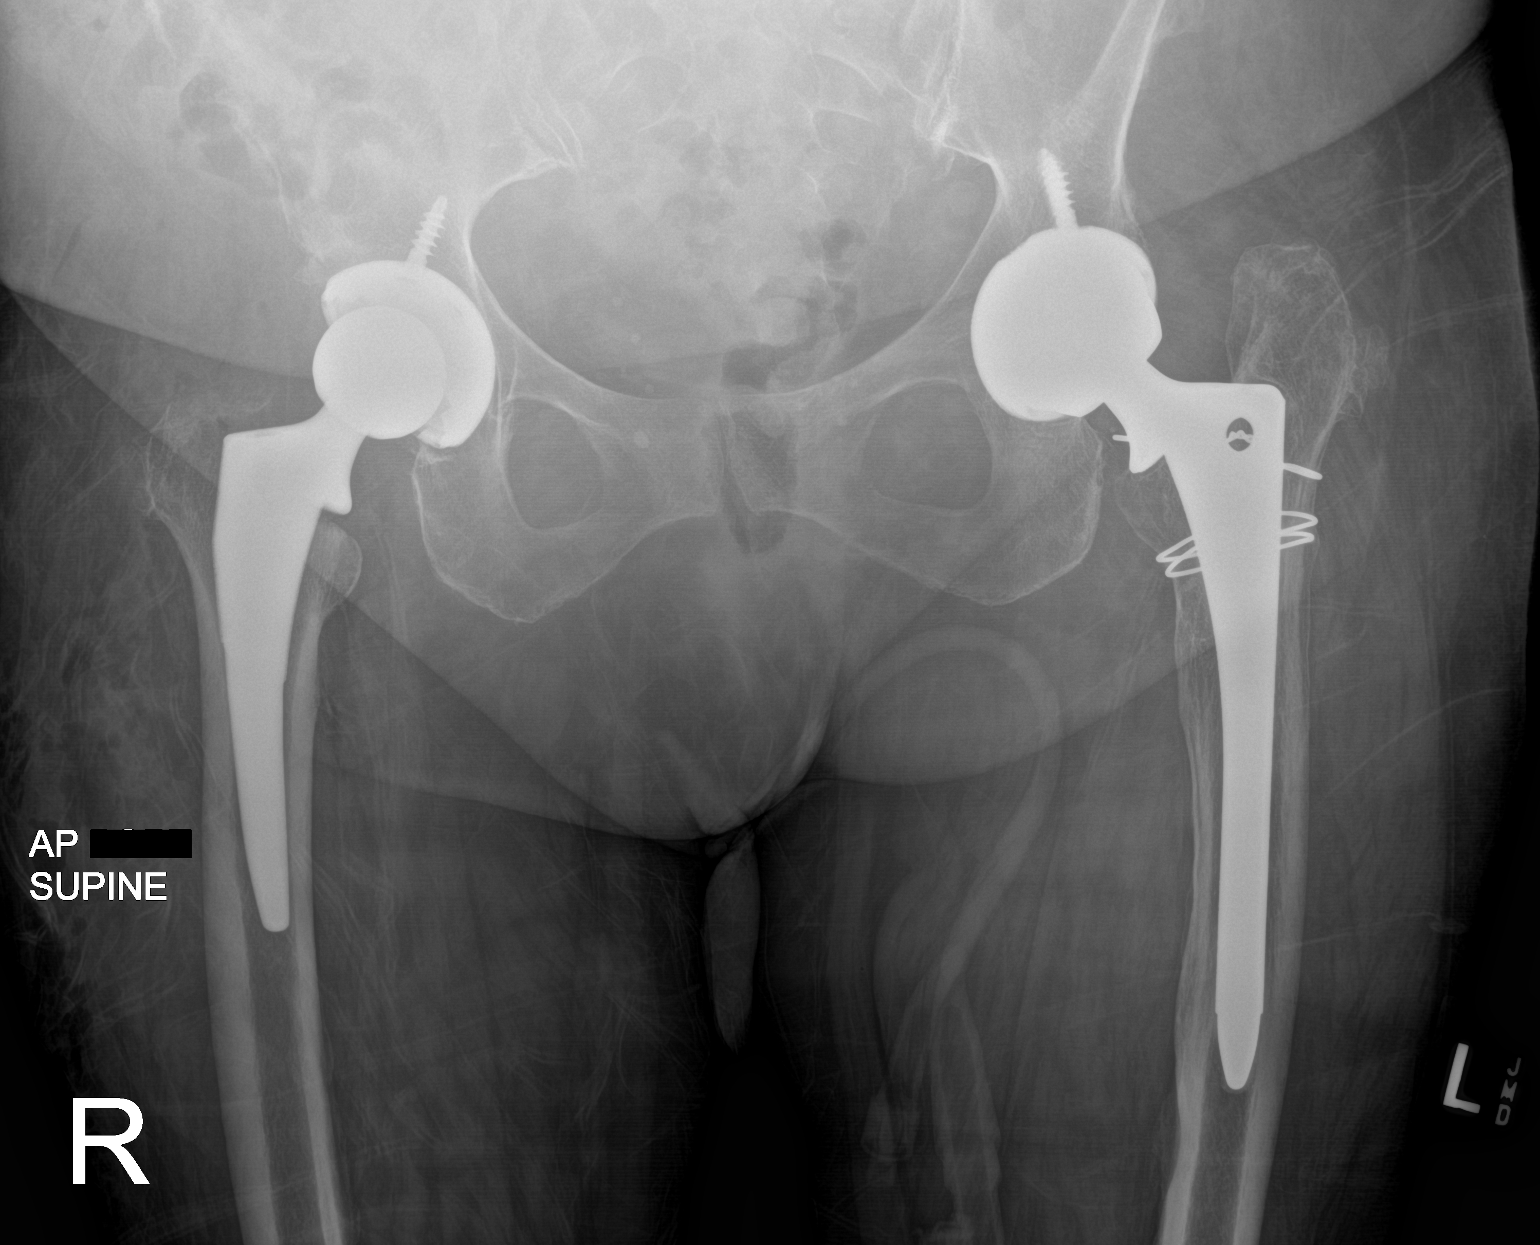

[1 of 1 positions shown; findings below may reference images not displayed]

FINDINGS: Portable AP supine view at 2228 hours. Preexisting left total hip
arthroplasty. New right total hip arthroplasty hardware in place.
Normal alignment in the AP view. No unexpected osseous changes. Mild
regional postoperative soft tissue gas. Negative visible bowel gas
pattern.
IMPRESSION: Right total hip arthroplasty with no adverse features.
